# Patient Record
Sex: Female | Born: 1940 | Race: White | Hispanic: No | Marital: Single | Smoking: Never smoker
Health system: Southern US, Community
[De-identification: ages and names within clinical notes are randomized; demographics above are authoritative.]

## PROBLEM LIST (undated history)

## (undated) DIAGNOSIS — J449 Chronic obstructive pulmonary disease, unspecified: Secondary | ICD-10-CM

## (undated) DIAGNOSIS — I509 Heart failure, unspecified: Secondary | ICD-10-CM

## (undated) DIAGNOSIS — IMO0001 Reserved for inherently not codable concepts without codable children: Secondary | ICD-10-CM

## (undated) DIAGNOSIS — I219 Acute myocardial infarction, unspecified: Secondary | ICD-10-CM

## (undated) DIAGNOSIS — I2510 Atherosclerotic heart disease of native coronary artery without angina pectoris: Secondary | ICD-10-CM

## (undated) DIAGNOSIS — F32A Depression, unspecified: Secondary | ICD-10-CM

## (undated) DIAGNOSIS — K219 Gastro-esophageal reflux disease without esophagitis: Secondary | ICD-10-CM

## (undated) DIAGNOSIS — G4700 Insomnia, unspecified: Secondary | ICD-10-CM

## (undated) DIAGNOSIS — N179 Acute kidney failure, unspecified: Secondary | ICD-10-CM

## (undated) DIAGNOSIS — R0600 Dyspnea, unspecified: Secondary | ICD-10-CM

## (undated) DIAGNOSIS — F329 Major depressive disorder, single episode, unspecified: Secondary | ICD-10-CM

## (undated) HISTORY — PX: HIP FRACTURE SURGERY: SHX118

## (undated) HISTORY — PX: ANKLE SURGERY: SHX546

## (undated) HISTORY — PX: CHOLECYSTECTOMY: SHX55

## (undated) HISTORY — PX: ABDOMINAL HYSTERECTOMY: SHX81

## (undated) HISTORY — PX: HIATAL HERNIA REPAIR: SHX195

---

## 2006-12-10 ENCOUNTER — Ambulatory Visit (HOSPITAL_COMMUNITY): Admission: RE | Admit: 2006-12-10 | Discharge: 2006-12-10 | Payer: Self-pay | Admitting: Pulmonary Disease

## 2008-11-30 ENCOUNTER — Encounter: Admission: RE | Admit: 2008-11-30 | Discharge: 2008-11-30 | Payer: Self-pay | Admitting: Family Medicine

## 2010-08-05 ENCOUNTER — Emergency Department (HOSPITAL_COMMUNITY)
Admission: EM | Admit: 2010-08-05 | Discharge: 2010-08-05 | Payer: Self-pay | Source: Home / Self Care | Admitting: Emergency Medicine

## 2010-09-02 ENCOUNTER — Encounter: Payer: Self-pay | Admitting: Pulmonary Disease

## 2010-10-22 LAB — POCT CARDIAC MARKERS
CKMB, poc: <1 ng/mL — ABNORMAL LOW (ref 1.0–8.0)
Myoglobin, poc: 42 ng/mL (ref 12–200)
Troponin i, poc: <0.05 ng/mL (ref 0.00–0.09)

## 2010-10-22 LAB — CBC
HCT: 41 % (ref 36.0–46.0)
Hemoglobin: 12.7 g/dL (ref 12.0–15.0)
MCHC: 31 g/dL (ref 30.0–36.0)
RBC: 4.17 MIL/uL (ref 3.87–5.11)

## 2010-10-22 LAB — DIFFERENTIAL
Eosinophils Absolute: 0 10*3/uL (ref 0.0–0.7)
Eosinophils Relative: 0 % (ref 0–5)
Lymphocytes Relative: 10 % — ABNORMAL LOW (ref 12–46)
Monocytes Absolute: 0.6 10*3/uL (ref 0.1–1.0)
Neutro Abs: 6.1 10*3/uL (ref 1.7–7.7)

## 2010-10-22 LAB — BRAIN NATRIURETIC PEPTIDE: Pro B Natriuretic peptide (BNP): 94 pg/mL (ref 0.0–100.0)

## 2010-10-22 LAB — COMPREHENSIVE METABOLIC PANEL
BUN: 10 mg/dL (ref 6–23)
Calcium: 9.4 mg/dL (ref 8.4–10.5)
Chloride: 98 mEq/L (ref 96–112)
GFR calc non Af Amer: >60 mL/min (ref >60)
Glucose, Bld: 153 mg/dL — ABNORMAL HIGH (ref 70–99)
Sodium: 137 mEq/L (ref 135–145)

## 2011-05-06 ENCOUNTER — Other Ambulatory Visit: Payer: Self-pay | Admitting: Family Medicine

## 2011-05-16 ENCOUNTER — Inpatient Hospital Stay: Admission: RE | Admit: 2011-05-16 | Payer: Self-pay | Source: Ambulatory Visit

## 2011-05-17 ENCOUNTER — Ambulatory Visit
Admission: RE | Admit: 2011-05-17 | Discharge: 2011-05-17 | Disposition: A | Discharge status: Home / Self Care | Payer: Medicare Other | Source: Ambulatory Visit | Attending: Family Medicine | Admitting: Family Medicine

## 2012-03-24 ENCOUNTER — Encounter (HOSPITAL_COMMUNITY): Payer: Self-pay | Admitting: *Deleted

## 2012-03-24 ENCOUNTER — Emergency Department (HOSPITAL_COMMUNITY): Payer: Medicare Other

## 2012-03-24 ENCOUNTER — Inpatient Hospital Stay (HOSPITAL_COMMUNITY)
Admission: EM | Admit: 2012-03-24 | Discharge: 2012-04-08 | DRG: 870 | Disposition: A | Discharge status: Skilled Nursing Facility | Payer: Medicare Other | Attending: Internal Medicine | Admitting: Internal Medicine

## 2012-03-24 DIAGNOSIS — Z8249 Family history of ischemic heart disease and other diseases of the circulatory system: Secondary | ICD-10-CM

## 2012-03-24 DIAGNOSIS — F341 Dysthymic disorder: Secondary | ICD-10-CM | POA: Diagnosis present

## 2012-03-24 DIAGNOSIS — K8309 Other cholangitis: Secondary | ICD-10-CM | POA: Diagnosis present

## 2012-03-24 DIAGNOSIS — K838 Other specified diseases of biliary tract: Secondary | ICD-10-CM | POA: Diagnosis present

## 2012-03-24 DIAGNOSIS — I509 Heart failure, unspecified: Secondary | ICD-10-CM | POA: Diagnosis present

## 2012-03-24 DIAGNOSIS — J449 Chronic obstructive pulmonary disease, unspecified: Secondary | ICD-10-CM

## 2012-03-24 DIAGNOSIS — F329 Major depressive disorder, single episode, unspecified: Secondary | ICD-10-CM | POA: Diagnosis present

## 2012-03-24 DIAGNOSIS — I252 Old myocardial infarction: Secondary | ICD-10-CM

## 2012-03-24 DIAGNOSIS — I959 Hypotension, unspecified: Secondary | ICD-10-CM | POA: Diagnosis not present

## 2012-03-24 DIAGNOSIS — N390 Urinary tract infection, site not specified: Secondary | ICD-10-CM | POA: Diagnosis present

## 2012-03-24 DIAGNOSIS — R509 Fever, unspecified: Secondary | ICD-10-CM

## 2012-03-24 DIAGNOSIS — F419 Anxiety disorder, unspecified: Secondary | ICD-10-CM | POA: Diagnosis present

## 2012-03-24 DIAGNOSIS — R45851 Suicidal ideations: Secondary | ICD-10-CM

## 2012-03-24 DIAGNOSIS — R5381 Other malaise: Secondary | ICD-10-CM | POA: Diagnosis present

## 2012-03-24 DIAGNOSIS — Z9981 Dependence on supplemental oxygen: Secondary | ICD-10-CM

## 2012-03-24 DIAGNOSIS — R000 Tachycardia, unspecified: Secondary | ICD-10-CM | POA: Diagnosis present

## 2012-03-24 DIAGNOSIS — A419 Sepsis, unspecified organism: Principal | ICD-10-CM | POA: Diagnosis present

## 2012-03-24 DIAGNOSIS — J9601 Acute respiratory failure with hypoxia: Secondary | ICD-10-CM

## 2012-03-24 DIAGNOSIS — E559 Vitamin D deficiency, unspecified: Secondary | ICD-10-CM | POA: Diagnosis present

## 2012-03-24 DIAGNOSIS — J4489 Other specified chronic obstructive pulmonary disease: Secondary | ICD-10-CM | POA: Diagnosis present

## 2012-03-24 DIAGNOSIS — R079 Chest pain, unspecified: Secondary | ICD-10-CM | POA: Diagnosis present

## 2012-03-24 DIAGNOSIS — E871 Hypo-osmolality and hyponatremia: Secondary | ICD-10-CM | POA: Diagnosis present

## 2012-03-24 DIAGNOSIS — J9620 Acute and chronic respiratory failure, unspecified whether with hypoxia or hypercapnia: Secondary | ICD-10-CM | POA: Diagnosis present

## 2012-03-24 DIAGNOSIS — I2510 Atherosclerotic heart disease of native coronary artery without angina pectoris: Secondary | ICD-10-CM | POA: Diagnosis present

## 2012-03-24 DIAGNOSIS — D696 Thrombocytopenia, unspecified: Secondary | ICD-10-CM | POA: Diagnosis present

## 2012-03-24 DIAGNOSIS — R6521 Severe sepsis with septic shock: Secondary | ICD-10-CM | POA: Diagnosis present

## 2012-03-24 HISTORY — DX: Acute myocardial infarction, unspecified: I21.9

## 2012-03-24 HISTORY — DX: Atherosclerotic heart disease of native coronary artery without angina pectoris: I25.10

## 2012-03-24 HISTORY — DX: Heart failure, unspecified: I50.9

## 2012-03-24 LAB — POCT I-STAT, CHEM 8
BUN: 17 mg/dL (ref 6–23)
Chloride: 99 mEq/L (ref 96–112)
Potassium: 4.7 mEq/L (ref 3.5–5.1)
Sodium: 138 mEq/L (ref 135–145)
TCO2: 30 mmol/L (ref 0–100)

## 2012-03-24 LAB — CBC WITH DIFFERENTIAL/PLATELET
Basophils Absolute: 0 10*3/uL (ref 0.0–0.1)
Basophils Relative: 0 % (ref 0–1)
Eosinophils Absolute: 0.1 10*3/uL (ref 0.0–0.7)
Eosinophils Relative: 1 % (ref 0–5)
HCT: 38.9 % (ref 36.0–46.0)
Lymphs Abs: 0.5 10*3/uL — ABNORMAL LOW (ref 0.7–4.0)
MCV: 98.2 fL (ref 78.0–100.0)
Monocytes Relative: 10 % (ref 3–12)
Platelets: 185 10*3/uL (ref 150–400)
RBC: 3.96 MIL/uL (ref 3.87–5.11)
WBC: 6.1 10*3/uL (ref 4.0–10.5)

## 2012-03-24 MED ORDER — ASPIRIN 81 MG PO CHEW
324.0000 mg | CHEWABLE_TABLET | Freq: Once | ORAL | Status: AC
  Administered 2012-03-24: 324 mg via ORAL
  Filled 2012-03-24: qty 4

## 2012-03-24 MED ORDER — ONDANSETRON HCL 4 MG/2ML IJ SOLN
4.0000 mg | Freq: Once | INTRAMUSCULAR | Status: AC
  Administered 2012-03-24: 4 mg via INTRAVENOUS
  Filled 2012-03-24: qty 2

## 2012-03-24 MED ORDER — MORPHINE SULFATE 4 MG/ML IJ SOLN: 4.0000 mg | Freq: Once | INTRAMUSCULAR | Status: DC

## 2012-03-24 MED ORDER — PANTOPRAZOLE SODIUM 40 MG IV SOLR
40.0000 mg | Freq: Once | INTRAVENOUS | Status: AC
  Administered 2012-03-24: 40 mg via INTRAVENOUS
  Filled 2012-03-24: qty 40

## 2012-03-24 MED ORDER — MORPHINE SULFATE 4 MG/ML IJ SOLN
4.0000 mg | Freq: Once | INTRAMUSCULAR | Status: AC
Start: 2012-03-24 — End: 2012-03-24
  Administered 2012-03-24: 4 mg via INTRAVENOUS
  Filled 2012-03-24: qty 1

## 2012-03-24 MED ORDER — LORAZEPAM 2 MG/ML IJ SOLN
1.0000 mg | Freq: Once | INTRAMUSCULAR | Status: AC
  Administered 2012-03-24: 1 mg via INTRAVENOUS
  Filled 2012-03-24: qty 1

## 2012-03-24 NOTE — ED Notes (Signed)
Patient is resting comfortably.  Tolerated medications well.  Family at bedside.  Call light within reach.  Alarms on monitor.

## 2012-03-24 NOTE — ED Provider Notes (Signed)
History     CSN: [id number]  Arrival date & time 03/24/12  1811   First MD Initiated Contact with Patient 03/24/12 2133      Chief Complaint  Patient presents with  . Chest Pain    (Consider location/radiation/quality/duration/timing/severity/associated sxs/prior treatment) HPI Comments: Larina Lieurance is a 71 y.o. Female who has had persistent chest pain since yesterday. She takes nitroglycerin frequently for chest pain. In the last 5 days s is used it each day. Her pain is 10 out of 10, at the worst. It remains 10 out of 10 when seen in emergency department. She has no associated shortness of breath with this or dizziness. Her primary care doctor is giving her nitroglycerin. She states that she has never seen a cardiologist. She denies ever having a provocative stress test, or catheterization. She denies cough, nausea, vomiting, change in bowels or urinary habits.  The history is provided by the patient.    Past Medical History  Diagnosis Date  . Coronary artery disease   . Myocardial infarct   . CHF (congestive heart failure)     Past Surgical History  Procedure Date  . Cholecystectomy   . Abdominal hysterectomy   . Ankle surgery     No family history on file.  History  Substance Use Topics  . Smoking status: Never Smoker   . Smokeless tobacco: Not on file  . Alcohol Use: No    OB History    Grav Para Term Preterm Abortions TAB SAB Ect Mult Living                  Review of Systems  All other systems reviewed and are negative.    Allergies  Ambien and Other  Home Medications   No current outpatient prescriptions on file.  BP 119/69  Pulse 77  Temp 98.4 F (36.9 C) (Oral)  Resp 18  Ht 5\' 1"  (1.549 m)  Wt 102 lb 11.8 oz (46.6 kg)  BMI 19.41 kg/m2  SpO2 100%  Physical Exam  Nursing note and vitals reviewed. Constitutional: She is oriented to person, place, and time. She appears well-developed and well-nourished. She appears distressed (She is  uncomfortable).  HENT:  Head: Normocephalic and atraumatic.  Eyes: Conjunctivae and EOM are normal. Pupils are equal, round, and reactive to light.  Neck: Normal range of motion and phonation normal. Neck supple.  Cardiovascular: Normal rate, regular rhythm and intact distal pulses.   Pulmonary/Chest: Effort normal and breath sounds normal. No respiratory distress. She has no wheezes. She exhibits no tenderness.  Abdominal: Soft. She exhibits no distension. There is no tenderness. There is no guarding.  Musculoskeletal: Normal range of motion. She exhibits no edema and no tenderness.  Neurological: She is alert and oriented to person, place, and time. She has normal strength. She exhibits normal muscle tone.  Skin: Skin is warm and dry.  Psychiatric: Her behavior is normal. Judgment and thought content normal.       She is anxious    ED Course  Procedures (including critical care time) Emergency department treatment: Morphine, Zofran, Ativan, protonix, aspirin  Reevaluation: 23:30- chest pain, has improved to 4/10. Patient sedated, can be aroused to speak and answer questions.    Date: 03/24/2012  Rate: 83  Rhythm: normal sinus rhythm  QRS Axis: normal  Intervals: normal  ST/T Wave abnormalities: normal  Conduction Disutrbances:none  Narrative Interpretation: LAH, poor R wave Progression  Old EKG Reviewed: unchanged    Labs Reviewed  CBC  WITH DIFFERENTIAL - Abnormal; Notable for the following:    Neutrophils Relative 81 (*)     Lymphocytes Relative 8 (*)     Lymphs Abs 0.5 (*)     All other components within normal limits  POCT I-STAT, CHEM 8 - Abnormal; Notable for the following:    Glucose, Bld 168 (*)     All other components within normal limits  COMPREHENSIVE METABOLIC PANEL - Abnormal; Notable for the following:    Glucose, Bld 150 (*)     Creatinine, Ser 0.42 (*)  DELTA CHECK NOTED   AST 360 (*)     ALT 425 (*)     Alkaline Phosphatase 239 (*)     Total  Bilirubin 3.6 (*)     All other components within normal limits  CBC - Abnormal; Notable for the following:    WBC 11.6 (*)     All other components within normal limits  GLUCOSE, CAPILLARY - Abnormal; Notable for the following:    Glucose-Capillary 162 (*)     All other components within normal limits  HEMOGLOBIN A1C - Abnormal; Notable for the following:    Hemoglobin A1C 6.0 (*)     Mean Plasma Glucose 126 (*)     All other components within normal limits  GLUCOSE, CAPILLARY - Abnormal; Notable for the following:    Glucose-Capillary 146 (*)     All other components within normal limits  GLUCOSE, CAPILLARY - Abnormal; Notable for the following:    Glucose-Capillary 137 (*)     All other components within normal limits  GLUCOSE, CAPILLARY - Abnormal; Notable for the following:    Glucose-Capillary 191 (*)     All other components within normal limits  URINALYSIS, ROUTINE W REFLEX MICROSCOPIC - Abnormal; Notable for the following:    Color, Urine AMBER (*)  BIOCHEMICALS MAY BE AFFECTED BY COLOR   Glucose, UA 100 (*)     Hgb urine dipstick MODERATE (*)     Bilirubin Urine LARGE (*)     Ketones, ur 15 (*)     Protein, ur 30 (*)     Nitrite POSITIVE (*)     Leukocytes, UA SMALL (*)     All other components within normal limits  URINE MICROSCOPIC-ADD ON - Abnormal; Notable for the following:    Squamous Epithelial / LPF FEW (*)     Casts GRANULAR CAST (*)     All other components within normal limits  GLUCOSE, CAPILLARY - Abnormal; Notable for the following:    Glucose-Capillary 151 (*)     All other components within normal limits  BASIC METABOLIC PANEL - Abnormal; Notable for the following:    Potassium 3.1 (*)     Glucose, Bld 142 (*)     Calcium 8.1 (*)     All other components within normal limits  CBC - Abnormal; Notable for the following:    WBC 22.4 (*)     RBC 3.59 (*)     Hemoglobin 11.2 (*)     HCT 35.0 (*)     Platelets 127 (*)     All other components within  normal limits  LACTIC ACID, PLASMA - Abnormal; Notable for the following:    Lactic Acid, Venous 3.5 (*)     All other components within normal limits  GLUCOSE, CAPILLARY - Abnormal; Notable for the following:    Glucose-Capillary 100 (*)     All other components within normal limits  CBC WITH DIFFERENTIAL -  Abnormal; Notable for the following:    WBC 15.0 (*)     RBC 3.24 (*)     Hemoglobin 10.3 (*)     HCT 31.8 (*)     Platelets 109 (*)  PLATELET COUNT CONFIRMED BY SMEAR   Neutrophils Relative 95 (*)     Lymphocytes Relative 2 (*)     Neutro Abs 14.2 (*)     Lymphs Abs 0.3 (*)     All other components within normal limits  BLOOD GAS, ARTERIAL - Abnormal; Notable for the following:    pO2, Arterial 65.0 (*)     Acid-base deficit 2.9 (*)     All other components within normal limits  COMPREHENSIVE METABOLIC PANEL - Abnormal; Notable for the following:    Potassium 3.4 (*)     Glucose, Bld 109 (*)     Calcium 7.6 (*)     Total Protein 4.8 (*)     Albumin 2.5 (*)     AST 117 (*)     ALT 216 (*)     Alkaline Phosphatase 143 (*)     Total Bilirubin 3.4 (*)     All other components within normal limits  LACTIC ACID, PLASMA - Abnormal; Notable for the following:    Lactic Acid, Venous 2.5 (*)     All other components within normal limits  CARBOXYHEMOGLOBIN - Abnormal; Notable for the following:    Total hemoglobin 11.3 (*)     Methemoglobin 1.9 (*)     All other components within normal limits  GLUCOSE, CAPILLARY - Abnormal; Notable for the following:    Glucose-Capillary 130 (*)     All other components within normal limits  POCT I-STAT 3, BLOOD GAS (G3+) - Abnormal; Notable for the following:    pO2, Arterial 68.0 (*)     Acid-base deficit 5.0 (*)     All other components within normal limits  FIBRINOGEN - Abnormal; Notable for the following:    Fibrinogen 571 (*)     All other components within normal limits  PROTIME-INR - Abnormal; Notable for the following:     Prothrombin Time 18.1 (*)     All other components within normal limits  GLUCOSE, CAPILLARY - Abnormal; Notable for the following:    Glucose-Capillary 117 (*)     All other components within normal limits  GLUCOSE, CAPILLARY - Abnormal; Notable for the following:    Glucose-Capillary 132 (*)     All other components within normal limits  CBC - Abnormal; Notable for the following:    RBC 3.37 (*)     Hemoglobin 10.3 (*)     HCT 32.6 (*)     Platelets 64 (*)     All other components within normal limits  BASIC METABOLIC PANEL - Abnormal; Notable for the following:    Sodium 130 (*)  DELTA CHECK NOTED   Glucose, Bld 174 (*)     BUN 24 (*)     Calcium 7.4 (*)     GFR calc non Af Amer 86 (*)     All other components within normal limits  CBC - Abnormal; Notable for the following:    WBC 26.3 (*)     RBC 3.33 (*)     Hemoglobin 10.3 (*)     HCT 31.9 (*)     Platelets 44 (*)     All other components within normal limits  HEPATIC FUNCTION PANEL - Abnormal; Notable for the following:  Total Protein 4.6 (*)     Albumin 2.0 (*)     AST 76 (*)     ALT 139 (*)     Alkaline Phosphatase 156 (*)     Total Bilirubin 4.5 (*)     Bilirubin, Direct 3.8 (*)     All other components within normal limits  LACTIC ACID, PLASMA - Abnormal; Notable for the following:    Lactic Acid, Venous 2.5 (*)     All other components within normal limits  PROTIME-INR - Abnormal; Notable for the following:    Prothrombin Time 15.7 (*)     All other components within normal limits  GLUCOSE, CAPILLARY - Abnormal; Notable for the following:    Glucose-Capillary 122 (*)     All other components within normal limits  GLUCOSE, CAPILLARY - Abnormal; Notable for the following:    Glucose-Capillary 128 (*)     All other components within normal limits  GLUCOSE, CAPILLARY - Abnormal; Notable for the following:    Glucose-Capillary 141 (*)     All other components within normal limits  GLUCOSE, CAPILLARY -  Abnormal; Notable for the following:    Glucose-Capillary 147 (*)     All other components within normal limits  POCT I-STAT TROPONIN I  TROPONIN I  MAGNESIUM  CARDIAC PANEL(CRET KIN+CKTOT+MB+TROPI)  CARDIAC PANEL(CRET KIN+CKTOT+MB+TROPI)  CARDIAC PANEL(CRET KIN+CKTOT+MB+TROPI)  TSH  CULTURE, BLOOD (ROUTINE X 2)  CULTURE, BLOOD (ROUTINE X 2)  URINE CULTURE  MRSA PCR SCREENING  GLUCOSE, CAPILLARY  PROCALCITONIN  CARDIAC PANEL(CRET KIN+CKTOT+MB+TROPI)  AMYLASE  LIPASE, BLOOD  CORTISOL  PROCALCITONIN  TYPE AND SCREEN  CARDIAC PANEL(CRET KIN+CKTOT+MB+TROPI)  ABO/RH  APTT  GLUCOSE, CAPILLARY  PROCALCITONIN  TSH   Ct Abdomen Pelvis W Contrast  03/26/2012  *RADIOLOGY REPORT*  Clinical Data: Abdominal pain, septic shock, nausea/vomiting  CT ABDOMEN AND PELVIS WITH CONTRAST  Technique:  Multidetector CT imaging of the abdomen and pelvis was performed following the standard protocol during bolus administration of intravenous contrast.  Contrast: 80mL OMNIPAQUE IOHEXOL 300 MG/ML  SOLN  Comparison: None.  Findings: Very large hiatal hernia, incompletely visualized, containing stomach to the right of midline (series 2/image 7), multiple loops of opacified and nonopacified bowel, and pancreas centrally (series 2/image 6).  Liver is notable for moderate to severe central intrahepatic ductal dilatation.  Common bile duct is markedly dilated with a 1.5 x 1.0 cm abnormal soft tissue lesion centrally (series 2/image 22) and a 2.7 x 1.9 cm obstructing mass distally near the ampulla in the left abdomen (series 2/image 15).  Status post cholecystectomy.  Spleen and adrenal glands are unremarkable.  5 mm nonobstructing left upper pole calculus (series 2/image 11). Right kidney is unremarkable.  No hydronephrosis.  Enteric tube terminates in the proximal duodenum in the left abdomen (series 2/image 23).  Contrast passes into the colon, indicating that the large hernia is nonobstructive.  Colonic  diverticulosis, without associated inflammatory changes.  Atherosclerotic calcifications of the abdominal aorta and branch vessels.  Small volume abdominopelvic ascites.  Status post hysterectomy.  No adnexal masses.  Bladder is notable for nondependent gas and an indwelling Foley catheter.  Prior ORIF of the proximal left femur.  Degenerative changes of the visualized thoracolumbar spine.  Mild superior endplate compression deformities at L2 and L5.  IMPRESSION:  Distorted anatomy with very large hiatal hernia containing stomach, multiple loops of bowel, and pancreas, incompletely visualized.  No evidence of bowel obstruction.  Marked dilatation of the common bile duct with at  least two intraductal lesions, as described above, including a 2.7 x 1.9 cm obstructing mass near the ampulla. Moderate to severe intrahepatic ductal dilatation.  Additional ancillary findings as above.  Original Report Authenticated By: Charline Bills, M.D.   Dg Chest Port 1 View  03/26/2012  *RADIOLOGY REPORT*  Clinical Data: Endotracheal tube placement.  Central venous catheter placement.  PORTABLE CHEST - 1 VIEW  Comparison: 03/26/2012.  Findings: Endotracheal tube is 15 mm from the carina.  New right IJ central line is present with its tip one vertebral body below the carina, in the mid-to-lower SVC.  Large bilateral pleural effusions.  Cardiopericardial silhouette mostly obscured by effusions.  Basilar atelectasis.  Pulmonary vascular congestion. Bobby pin projects over the right upper chest.  IMPRESSION:  1.  New endotracheal tube is 15 mm from the carina. 2.  Uncomplicated right IJ central line placement with the tip in the mid SVC. 3.  Bilateral pleural effusions, left greater than right with associated compressive atelectasis.  Probable cardiomegaly with portions of the cardiopericardial silhouette obscured  Original Report Authenticated By: Andreas Newport, M.D.   Dg Chest Port 1 View  03/26/2012  *RADIOLOGY REPORT*   Clinical Data: Chest pressure.  PORTABLE CHEST - 1 VIEW  Comparison: 03/24/2012.  Findings: The cardiopericardial size silhouette is obscured. Surgical clip is present in the lower chest. Bilateral left greater than right pleural effusions are present, increased from prior exam.  Additionally, there is pulmonary vascular congestion and bilateral lower lobe airspace disease, most compatible with pulmonary edema in the interval since the prior exam.  IMPRESSION: Findings compatible with moderate CHF superimposed on chronic changes.  Bilateral left greater than right pleural effusions.  Original Report Authenticated By: Andreas Newport, M.D.     1. Chest pain   2. Anxiety   3. CAD (coronary artery disease)   4. Chest pain syndrome   5. CHF (congestive heart failure)   6. COPD (chronic obstructive pulmonary disease)   7. Vitamin D deficiency disease   8. Suicidal ideation   9. Septic shock   10. UTI (lower urinary tract infection)   11. Acute respiratory failure with hypoxia       MDM  Nonspecific chest pain, with low cardiac risk. The chest pain is atypical for coronary artery disease. She's never had formal testing. Patient is decompensated. She will need further evaluation In hospital setting.      Plan: Admit- Hospitalist  Flint Melter, MD 03/27/12 [phone]

## 2012-03-24 NOTE — ED Notes (Signed)
Pt is on 3L O2 [place] at home.

## 2012-03-24 NOTE — ED Notes (Signed)
Pt is here with chest pain that started yesterday afternoon and she took nitro and has continued to reoccur.  Pt is having mid chest pain with radiation into left jaw

## 2012-03-25 DIAGNOSIS — F419 Anxiety disorder, unspecified: Secondary | ICD-10-CM | POA: Diagnosis present

## 2012-03-25 DIAGNOSIS — I509 Heart failure, unspecified: Secondary | ICD-10-CM | POA: Diagnosis present

## 2012-03-25 DIAGNOSIS — R079 Chest pain, unspecified: Secondary | ICD-10-CM | POA: Diagnosis present

## 2012-03-25 DIAGNOSIS — R509 Fever, unspecified: Secondary | ICD-10-CM

## 2012-03-25 DIAGNOSIS — E559 Vitamin D deficiency, unspecified: Secondary | ICD-10-CM | POA: Diagnosis present

## 2012-03-25 DIAGNOSIS — I2510 Atherosclerotic heart disease of native coronary artery without angina pectoris: Secondary | ICD-10-CM | POA: Diagnosis present

## 2012-03-25 DIAGNOSIS — R45851 Suicidal ideations: Secondary | ICD-10-CM

## 2012-03-25 DIAGNOSIS — F411 Generalized anxiety disorder: Secondary | ICD-10-CM

## 2012-03-25 DIAGNOSIS — N390 Urinary tract infection, site not specified: Secondary | ICD-10-CM

## 2012-03-25 DIAGNOSIS — F332 Major depressive disorder, recurrent severe without psychotic features: Secondary | ICD-10-CM

## 2012-03-25 DIAGNOSIS — J449 Chronic obstructive pulmonary disease, unspecified: Secondary | ICD-10-CM | POA: Diagnosis present

## 2012-03-25 DIAGNOSIS — I959 Hypotension, unspecified: Secondary | ICD-10-CM | POA: Diagnosis not present

## 2012-03-25 LAB — TSH: TSH: 1.573 u[IU]/mL (ref 0.350–4.500)

## 2012-03-25 LAB — CARDIAC PANEL(CRET KIN+CKTOT+MB+TROPI)
CK, MB: 1.5 ng/mL (ref 0.3–4.0)
CK, MB: 2.2 ng/mL (ref 0.3–4.0)
Relative Index: INVALID (ref 0.0–2.5)
Total CK: 100 U/L (ref 7–177)
Troponin I: <0.3 ng/mL (ref <0.30)
Troponin I: <0.3 ng/mL (ref <0.30)

## 2012-03-25 LAB — COMPREHENSIVE METABOLIC PANEL
AST: 360 U/L — ABNORMAL HIGH (ref 0–37)
Albumin: 3.5 g/dL (ref 3.5–5.2)
Alkaline Phosphatase: 239 U/L — ABNORMAL HIGH (ref 39–117)
CO2: 29 mEq/L (ref 19–32)
Chloride: 99 mEq/L (ref 96–112)
Creatinine, Ser: 0.42 mg/dL — ABNORMAL LOW (ref 0.50–1.10)
GFR calc non Af Amer: >90 mL/min (ref >90)
Potassium: 3.9 mEq/L (ref 3.5–5.1)
Total Bilirubin: 3.6 mg/dL — ABNORMAL HIGH (ref 0.3–1.2)

## 2012-03-25 LAB — URINALYSIS, ROUTINE W REFLEX MICROSCOPIC
Ketones, ur: 15 mg/dL — AB
Specific Gravity, Urine: 1.027 (ref 1.005–1.030)
pH: 5.5 (ref 5.0–8.0)

## 2012-03-25 LAB — GLUCOSE, CAPILLARY
Glucose-Capillary: 191 mg/dL — ABNORMAL HIGH (ref 70–99)
Glucose-Capillary: 151 mg/dL — ABNORMAL HIGH (ref 70–99)
Glucose-Capillary: 100 mg/dL — ABNORMAL HIGH (ref 70–99)

## 2012-03-25 LAB — CBC
HCT: 39.2 % (ref 36.0–46.0)
MCH: 31.5 pg (ref 26.0–34.0)
MCHC: 32.1 g/dL (ref 30.0–36.0)
MCV: 98 fL (ref 78.0–100.0)
Platelets: 179 10*3/uL (ref 150–400)
RDW: 13.3 % (ref 11.5–15.5)
WBC: 11.6 10*3/uL — ABNORMAL HIGH (ref 4.0–10.5)

## 2012-03-25 LAB — URINE MICROSCOPIC-ADD ON

## 2012-03-25 LAB — MRSA PCR SCREENING: MRSA by PCR: NEGATIVE

## 2012-03-25 LAB — LACTIC ACID, PLASMA: Lactic Acid, Venous: 3.5 mmol/L — ABNORMAL HIGH (ref 0.5–2.2)

## 2012-03-25 LAB — PROCALCITONIN: Procalcitonin: 9.25 ng/mL

## 2012-03-25 MED ORDER — NITROGLYCERIN 0.4 MG SL SUBL: 0.4000 mg | SUBLINGUAL_TABLET | SUBLINGUAL | Status: DC | PRN

## 2012-03-25 MED ORDER — NITROGLYCERIN 0.1 MG/HR TD PT24
0.1000 mg | MEDICATED_PATCH | Freq: Every day | TRANSDERMAL | Status: DC
  Administered 2012-03-25: 0.1 mg via TRANSDERMAL
  Filled 2012-03-25 (×4): qty 1

## 2012-03-25 MED ORDER — INSULIN ASPART 100 UNIT/ML ~~LOC~~ SOLN
0.0000 [IU] | Freq: Three times a day (TID) | SUBCUTANEOUS | Status: DC
  Administered 2012-03-25: 2 [IU] via SUBCUTANEOUS
  Administered 2012-03-25 – 2012-03-28 (×3): 1 [IU] via SUBCUTANEOUS

## 2012-03-25 MED ORDER — BIOTENE DRY MOUTH MT LIQD
15.0000 mL | Freq: Two times a day (BID) | OROMUCOSAL | Status: DC
  Administered 2012-03-25 – 2012-04-03 (×17): 15 mL via OROMUCOSAL

## 2012-03-25 MED ORDER — SODIUM CHLORIDE 0.9 % IJ SOLN
3.0000 mL | Freq: Two times a day (BID) | INTRAMUSCULAR | Status: DC
  Administered 2012-03-25 – 2012-03-30 (×10): 3 mL via INTRAVENOUS

## 2012-03-25 MED ORDER — SODIUM CHLORIDE 0.9 % IV SOLN: 250.0000 mL | INTRAVENOUS | Status: DC | PRN

## 2012-03-25 MED ORDER — ACETAMINOPHEN 650 MG RE SUPP: 650.0000 mg | Freq: Four times a day (QID) | RECTAL | Status: DC | PRN

## 2012-03-25 MED ORDER — ONDANSETRON HCL 4 MG/2ML IJ SOLN
4.0000 mg | Freq: Four times a day (QID) | INTRAMUSCULAR | Status: DC | PRN
  Administered 2012-03-25 – 2012-04-01 (×3): 4 mg via INTRAVENOUS
  Filled 2012-03-25 (×4): qty 2

## 2012-03-25 MED ORDER — SODIUM CHLORIDE 0.9 % IV BOLUS (SEPSIS)
500.0000 mL | Freq: Once | INTRAVENOUS | Status: AC
  Administered 2012-03-25: 500 mL via INTRAVENOUS

## 2012-03-25 MED ORDER — ONDANSETRON HCL 4 MG/2ML IJ SOLN: 4.0000 mg | Freq: Three times a day (TID) | INTRAMUSCULAR | Status: DC | PRN

## 2012-03-25 MED ORDER — ACETAMINOPHEN 325 MG PO TABS
650.0000 mg | ORAL_TABLET | Freq: Four times a day (QID) | ORAL | Status: DC | PRN
  Administered 2012-03-25 – 2012-03-29 (×3): 650 mg via ORAL
  Filled 2012-03-25 (×2): qty 2

## 2012-03-25 MED ORDER — SODIUM CHLORIDE 0.9 % IV SOLN
750.0000 mg | INTRAVENOUS | Status: DC
  Administered 2012-03-25 – 2012-03-27 (×2): 750 mg via INTRAVENOUS
  Filled 2012-03-25 (×3): qty 750

## 2012-03-25 MED ORDER — CARVEDILOL 12.5 MG PO TABS
12.5000 mg | ORAL_TABLET | Freq: Two times a day (BID) | ORAL | Status: DC
  Administered 2012-03-25: 12.5 mg via ORAL
  Filled 2012-03-25 (×3): qty 1

## 2012-03-25 MED ORDER — HYDROCODONE-ACETAMINOPHEN 5-325 MG PO TABS
1.0000 | ORAL_TABLET | ORAL | Status: DC | PRN
  Administered 2012-03-25: 2 via ORAL
  Administered 2012-03-25: 1 via ORAL
  Filled 2012-03-25: qty 2
  Filled 2012-03-25: qty 1

## 2012-03-25 MED ORDER — METOPROLOL TARTRATE 1 MG/ML IV SOLN
5.0000 mg | Freq: Once | INTRAVENOUS | Status: AC
  Administered 2012-03-25: 5 mg via INTRAVENOUS

## 2012-03-25 MED ORDER — ASPIRIN EC 81 MG PO TBEC
81.0000 mg | DELAYED_RELEASE_TABLET | Freq: Every day | ORAL | Status: DC
  Administered 2012-03-25 – 2012-04-08 (×12): 81 mg via ORAL
  Filled 2012-03-25 (×15): qty 1

## 2012-03-25 MED ORDER — MORPHINE SULFATE 2 MG/ML IJ SOLN: 1.0000 mg | INTRAMUSCULAR | Status: DC | PRN

## 2012-03-25 MED ORDER — SODIUM CHLORIDE 0.9 % IV BOLUS (SEPSIS)
500.0000 mL | Freq: Once | INTRAVENOUS | Status: AC
  Administered 2012-03-26: 500 mL via INTRAVENOUS

## 2012-03-25 MED ORDER — HYDROCODONE-ACETAMINOPHEN 7.5-325 MG PO TABS
1.0000 | ORAL_TABLET | Freq: Three times a day (TID) | ORAL | Status: DC
  Administered 2012-03-25 (×2): 1 via ORAL
  Filled 2012-03-25 (×2): qty 1

## 2012-03-25 MED ORDER — ONDANSETRON HCL 4 MG/5ML PO SOLN
4.0000 mg | Freq: Four times a day (QID) | ORAL | Status: DC | PRN
  Filled 2012-03-25: qty 5

## 2012-03-25 MED ORDER — SODIUM CHLORIDE 0.9 % IV BOLUS (SEPSIS)
1000.0000 mL | Freq: Once | INTRAVENOUS | Status: AC
  Administered 2012-03-25: 1000 mL via INTRAVENOUS

## 2012-03-25 MED ORDER — SODIUM CHLORIDE 0.9 % IV BOLUS (SEPSIS): 1000.0000 mL | Freq: Once | INTRAVENOUS | Status: DC

## 2012-03-25 MED ORDER — ENOXAPARIN SODIUM 30 MG/0.3ML ~~LOC~~ SOLN
30.0000 mg | Freq: Every day | SUBCUTANEOUS | Status: DC
  Administered 2012-03-25: 30 mg via SUBCUTANEOUS
  Filled 2012-03-25 (×2): qty 0.3

## 2012-03-25 MED ORDER — DEXTROSE 5 % IV SOLN
1.0000 g | INTRAVENOUS | Status: DC
  Administered 2012-03-25: 1 g via INTRAVENOUS
  Filled 2012-03-25: qty 10

## 2012-03-25 MED ORDER — DOCUSATE SODIUM 100 MG PO CAPS
100.0000 mg | ORAL_CAPSULE | Freq: Two times a day (BID) | ORAL | Status: DC
  Administered 2012-03-25 (×2): 100 mg via ORAL
  Filled 2012-03-25 (×3): qty 1

## 2012-03-25 MED ORDER — PANTOPRAZOLE SODIUM 40 MG PO TBEC
40.0000 mg | DELAYED_RELEASE_TABLET | Freq: Two times a day (BID) | ORAL | Status: DC
  Administered 2012-03-25: 40 mg via ORAL
  Filled 2012-03-25: qty 1

## 2012-03-25 MED ORDER — DEXTROSE 5 % IV SOLN
1.0000 g | INTRAVENOUS | Status: DC
  Administered 2012-03-25: 1 g via INTRAVENOUS
  Filled 2012-03-25: qty 1

## 2012-03-25 MED ORDER — SODIUM CHLORIDE 0.9 % IJ SOLN: 3.0000 mL | INTRAMUSCULAR | Status: DC | PRN

## 2012-03-25 MED ORDER — LORAZEPAM 2 MG/ML IJ SOLN
1.0000 mg | Freq: Once | INTRAMUSCULAR | Status: DC
  Filled 2012-03-25: qty 1

## 2012-03-25 MED ORDER — METOPROLOL TARTRATE 1 MG/ML IV SOLN
INTRAVENOUS | Status: AC
  Filled 2012-03-25: qty 5

## 2012-03-25 NOTE — Clinical Documentation Improvement (Signed)
BMI DOCUMENTATION CLARIFICATION QUERY  THIS DOCUMENT IS NOT A PERMANENT PART OF THE MEDICAL RECORD  TO RESPOND TO THE THIS QUERY, FOLLOW THE INSTRUCTIONS BELOW:  1. If needed, update documentation for the patient's encounter via the notes activity.  2. Access this query again and click edit on the In [employer].  3. After updating, or not, click F2 to complete all highlighted (required) fields concerning your review. Select "additional documentation in the medical record" OR "no additional documentation provided".  4. Click Sign note button.  5. The deficiency will fall out of your In Basket *Please let us know if you are not able to complete this workflow by phone or e-mail (listed below).         03/25/12  Dear Dr. Sharyon Medicus  Marton Redwood  In an effort to better capture your patient's severity of illness, reflect appropriate length of stay and utilization of resources, a review of the patient medical record has revealed the following indicators.  THANK YOU FOR ADDRESSING IN NOTES AND DC SUMMARY ABNORMAL VALUE.  Possible Clinical conditions - Underweight= BMI <19.0  - Other Condition (please specify) - Cannot Clinically Determine  Supporting Information: - Patient's BMI = 18.4 per Doc flowsheet 8/14    Reviewed:  no additional documentation provided  Thank You,  Beverley Fiedler RN Clinical Documentation Specialist: Pager:  [phone] Health Information Management:  [phone] [facility]

## 2012-03-25 NOTE — Progress Notes (Addendum)
03/25/2012 2055  Pt admitted into 3307 from 3W with belongings. T 99.2  BP 88/44  HR 129  RR 22  SpO2 95% on 3L [place]. Pt alert and oriented to self. Schorr, NP with [facility], notified. New orders received for 500 cc bolus. Family and suicide sitter are at bedside. Will administer bolus and continue to monitor.  [place] Lewie Deman, RN

## 2012-03-25 NOTE — H&P (Signed)
See Signed H&P done same day

## 2012-03-25 NOTE — Progress Notes (Signed)
PT Cancellation Note    Treatment cancelled today due to pt not appropriate today--rapid response just called recently.  Will see tomorrow as able. 03/25/2012  [place] Bing, PT [phone] (pager)

## 2012-03-25 NOTE — H&P (Signed)
[facility]                                                                                    Patient Demographics  Laurie Sparks, is a 71 y.o. female  CSN: [id number]  MRN: [id number]  DOB - [birth year]  Admit Date - 03/24/2012  Outpatient Primary MD for the patient is Warrick Parisian, MD   With History of -  Past Medical History  Diagnosis Date  . Coronary artery disease   . Myocardial infarct   . CHF (congestive heart failure)       Past Surgical History  Procedure Date  . Cholecystectomy   . Abdominal hysterectomy   . Ankle surgery     in for   Chief Complaint  Patient presents with  . Chest Pain     HPI  Laurie Sparks  is a 72 y.o. female, with a past medical history significant for carotid disease status post MI in the past presenting today with 2 days history of continuous chest pain retrosternal nonradiating associated with mild shortness of breath dry heaves but  no cold sweats the patient noted that her chest pain was getting worse with exercise although it was present at rest as well. Patient denies fevers chills cough. Patient denies any headaches or dizziness. Patient denies any loss of consciousness. The patient reports history of orthopnea and PND but no history of leg swelling. Patient has a history of coronary artery disease , but she doesn't remember any thing about her previous MI.   Review of Systems    In addition to the HPI above, No Fever-chills, No Headache, No changes with Vision or hearing, No problems swallowing food or Liquids, Has chest pain Chest pain, Cough or Shortness of Breath, No Abdominal pain,+Nausea or Vommitting, Bowel movements are regular, No Blood in stool or Urine, No dysuria, No new skin rashes or bruises, No new joints pains-aches,  No new weakness, tingling, numbness in any extremity, No recent weight gain or loss, No polyuria, polydypsia or polyphagia,  A full 10 point Review of Systems was done,  except as stated above, all other Review of Systems were negative.   Social History History  Substance Use Topics  . Smoking status: Never Smoker   . Smokeless tobacco: Not on file  . Alcohol Use: No    Family History Family history is significant for mother with history of coronary artery disease and tuberculosis and was dead now, her father had congestive heart failure and is also deceased.   Prior to Admission medications   Medication Sig Start Date End Date Taking? Authorizing Provider  aspirin EC 81 MG tablet Take 81 mg by mouth daily.   Yes Historical Provider, MD  carvedilol (COREG) 12.5 MG tablet Take 12.5 mg by mouth 2 (two) times daily with a meal.   Yes Historical Provider, MD  HYDROcodone-acetaminophen (NORCO) 7.5-325 MG per tablet Take 1 tablet by mouth 3 (three) times daily.   Yes Historical Provider, MD  nitroGLYCERIN (NITROSTAT) 0.4 MG SL tablet Place 0.4 mg under the tongue every 5 (five) minutes as needed. For chest pain   Yes Historical Provider, MD  Allergies  Allergen Reactions  . Ambien (Zolpidem Tartrate) Other (See Comments)    Hallucinate   . Other     Base metal and something else    Physical Exam  Vitals  Blood pressure 158/75, pulse 105, temperature 98.3 F (36.8 C), temperature source Oral, resp. rate 26, SpO2 99.00%.   1. General chronically ill debilitated patient lying in bed in no acute distress alert awake but slightly confused due to the medications that she received in the emergency room   2. Flat affectt, Not Suicidal or Homicidal, Awake Alert, confused 3. No F.N deficits, ALL C.Nerves Intact, Strength4/5 all 4 extremities, Sensation intact all 4 extremities, Plantars down going.  4. Ears and Eyes appear Normal, Conjunctivae clear, PERRLA. Moist Oral Mucosa.  5. Supple Neck, No JVD, No cervical lymphadenopathy appriciated, No Carotid Bruits.  6. Symmetrical Chest wall movement, decreased air movement bilaterally, tenderness noted  on the left chest area.  7. RRR, No Gallops, Rubs or Murmurs, No Parasternal Heave.  8. Positive Bowel Sounds, Abdomen Soft, Non tender, No organomegaly appriciated,No rebound -guarding or rigidity.  9.  No Cyanosis, Normal Skin Turgor, No Skin Rash or Bruise.  10. Good muscle tone,  joints appear normal , no effusions, Normal ROM.  11. No Palpable Lymph Nodes in Neck or Axillae    Data Review  CBC  Lab 03/24/12 1948 03/24/12 1940  WBC -- 6.1  HGB 13.9 12.5  HCT 41.0 38.9  PLT -- 185  MCV -- 98.2  MCH -- 31.6  MCHC -- 32.1  RDW -- 13.3  LYMPHSABS -- 0.5*  MONOABS -- 0.6  EOSABS -- 0.1  BASOSABS -- 0.0  BANDABS -- --   ------------------------------------------------------------------------------------------------------------------  Chemistries   Lab 03/24/12 1948  NA 138  K 4.7  CL 99  CO2 --  GLUCOSE 168*  BUN 17  CREATININE 0.70  CALCIUM --  MG --  AST --  ALT --  ALKPHOS --  BILITOT --   ------------------------------------------------------------------------------------------------------------------ CrCl is unknown because there is no height on file for the current visit. ------------------------------------------------------------------------------------------------------------------  Coagulation profile -------------------------------------------------------------------------------------------------------------------  Cardiac Enzymes  Lab 03/24/12 2154  CKMB --  TROPONINI <0.30  MYOGLOBIN --   ------------------------------------------------------------------------------------------------------------------  ---------------------------------------------------------------------------------------------------------------   ----------------------------------------------------------------------------------------------------------------  Imaging results:   Dg Chest 2 View  03/24/2012  *RADIOLOGY REPORT*  Clinical Data: Mid and left chest  pain.  CHEST - 2 VIEW  Comparison: PA and lateral chest 07/26/2010.  Findings: Marked elevation of the left hemidiaphragm is again seen. The chest is hyperexpanded.  Blunting of the right costophrenic angle is unchanged.  Heart size is upper normal.  IMPRESSION: No acute finding.  Stable compared to prior exam.  Original Report Authenticated By: Bernadene Bell. Maricela Curet, M.D.    My personal review of EKG: No acute changes probably old anteroseptal MI Assessment & Plan  Principal Problem:  *Chest pain syndrome Active Problems:  CAD (coronary artery disease)  COPD (chronic obstructive pulmonary disease)  Anxiety  CHF (congestive heart failure)  Vitamin D deficiency disease    1. chest pain rule out MI the patient has a history of coronary artery disease status post MI in the past according to her. There is some chest tenderness on the left side, but the patient says that her chest pain is worse with exercise. Patient has a history of atypical chest pain. We will rule out MI. DVT prophylaxis with Lovenox, aspirin, Nitro-Dur, check echocardiogram, consult cardiology in a.m.    2. congestive heart failure  check echocardiogram in a.m. patient has orthopnea .  3. hyper glycemia with no history of diabetes will follow with insulin sliding scale  4. history of COPD. Chronic  5. anxiety/depression. We'll continue Ativan when necessary for now the patient might benefit from antidepressant medications to be ordered as outpatient  6. history of chronic pain. Pain control  7. vitamin D deficiency. Chronic      DVT Prophylaxis-  Lovenox  AM Labs Ordered, also please review Full Orders  Family Communication: Admission, patients condition and plan of care including tests being ordered have been discussed with the patient and daughter who indicate understanding and agree with the plan and Code Status.  Code Status full  Disposition Plan: Home to followup with primary medical doctor  Time spent  in minutes : 30 Condition fair

## 2012-03-25 NOTE — Research (Signed)
Noticed pts HR St at 130s, walked into pts room and informed by psych MD that pt having hard time breathing; pts HR 127, Resp 26, O2 sat 95% 3 L-[place], rectal temp 102.7; pt shaking and looks pale, however pt not c/o chills; EKG obtained; Dr.Rai made aware, IV lopressor given, BP down to 118/52, pt resting comfortably, UA and UC obtained; will continue to monitor

## 2012-03-25 NOTE — Progress Notes (Addendum)
ANTIBIOTIC CONSULT NOTE - INITIAL  Pharmacy Consult for vancomycin and cefepime  Indication: rule out sepsis  Allergies  Allergen Reactions  . Ambien (Zolpidem Tartrate) Other (See Comments)    Hallucinate   . Other     Base metal and something else    Patient Measurements: Height: 5\' 1"  (154.9 cm) Weight: 97 lb (44 kg) IBW/kg (Calculated) : 47.8  Adjusted Body Weight:   Vital Signs: Temp: 99 F (37.2 C) (08/14 2201) Temp src: Oral (08/14 2201) BP: 88/38 mmHg (08/14 2201) Pulse Rate: 124  (08/14 2201) Intake/Output from previous day:   Intake/Output from this shift: Total I/O In: 3 [I.V.:3] Out: -   Labs:  Basename 03/25/12 0521 03/24/12 1948 03/24/12 1940  WBC 11.6* -- 6.1  HGB 12.6 13.9 12.5  PLT 179 -- 185  LABCREA -- -- --  CREATININE 0.42* 0.70 --   Estimated Creatinine Clearance: 44.8 ml/min (by C-G formula based on Cr of 0.42). No results found for this basename: VANCOTROUGH:2,VANCOPEAK:2,VANCORANDOM:2,GENTTROUGH:2,GENTPEAK:2,GENTRANDOM:2,TOBRATROUGH:2,TOBRAPEAK:2,TOBRARND:2,AMIKACINPEAK:2,AMIKACINTROU:2,AMIKACIN:2, in the last 72 hours   Microbiology: No results found for this or any previous visit (from the past 720 hour(s)).  Medical History: Past Medical History  Diagnosis Date  . Coronary artery disease   . Myocardial infarct   . CHF (congestive heart failure)     Medications:  Prescriptions prior to admission  Medication Sig Dispense Refill  . aspirin EC 81 MG tablet Take 81 mg by mouth daily.      . carvedilol (COREG) 12.5 MG tablet Take 12.5 mg by mouth 2 (two) times daily with a meal.      . HYDROcodone-acetaminophen (NORCO) 7.5-325 MG per tablet Take 1 tablet by mouth 3 (three) times daily.      . nitroGLYCERIN (NITROSTAT) 0.4 MG SL tablet Place 0.4 mg under the tongue every 5 (five) minutes as needed. For chest pain       Assessment: Probable uti and early sepsis. vanc and cefepime for empiric coverage. bp 88/44 and hr 129. Pt appears  lethargic but oriented.    Goal of Therapy:  Vancomycin trough level 15-20 mcg/ml  Plan:  Cefepime 1 gm q24hours  Vancomycin 750 mg q24 hours Follow up culture results F/u trough in 4 days Laurie Sparks 03/25/2012,10:35 PM  Cefepime dc'ed by critical care. Zosyn 3.375 gm q8h is appropriate for her renal fxn. No pcn allergies.

## 2012-03-25 NOTE — Care Management Note (Signed)
    Page 1 of 2   04/07/2012     2:15:56 PM   CARE MANAGEMENT NOTE 04/07/2012  Patient:  Laurie Sparks, Laurie Sparks   Account Number:  [id number]  Date Initiated:  03/25/2012  Documentation initiated by:  GRAVES-BIGELOW,Jerald Hennington  Subjective/Objective Assessment:   Pt admitted with cp. Pt is from home with 24 hour supervision provided by family. Pt has voiced concerns with suicidal ideations. Psychiatry is following pt.     Action/Plan:   CM will continue to monitor for medicaiton assistance for psych meds. Unsure if will be able to assist. Will f/u in am.   Anticipated DC Date:  03/27/2012   Anticipated DC Plan:  SKILLED NURSING FACILITY      DC Planning Services  CM consult      Choice offered to / List presented to:             Status of service:  Completed, signed off Medicare Important Message given?   (If response is "NO", the following Medicare IM given date fields will be blank) Date Medicare IM given:   Date Additional Medicare IM given:    Discharge Disposition:  SKILLED NURSING FACILITY  Per UR Regulation:  Reviewed for med. necessity/level of care/duration of stay  If discussed at Long Length of Stay Meetings, dates discussed:   04/02/2012  04/07/2012    Comments:  04-07-12 1414 Roselyn Bering- Mitzie Na, RN, BSN [phone] Plans for SNF placement. CSW working closely with pt on disposition needs. No further nneds for CM.   04/03/12 JULIE AMERSON,RN,BSN PT SUCCESSFULLY EXTUBATED TODAY.  WILL ASK PT AND OT TO EVALUATE TO DETERMINE LOC NEEDED AT DC.  WILL FOLLOW.  04/02/12 JULIE AMERSON,RN,BSN 1330 PT REMAINS INTUBATED; HESITANT TO EXTUBATE, AS PT HAS SEVERE COPD AND NEEDS GI PROCEDURE.  PT MAY NEED SNF FOR REHAB AT DC; WILL HAVE PT/OT FOLLOW UP WHEN EXTUBATED.  03/26/12 JULIE AMERSON,RN,BSN 1100 PT WITH HX OF COPD ADM TO ICU WITH RESP FAILURE SECONDARY TO PULM EDEMA, SEPTIC SHOCK.  PTA, PT LIVES WITH DAUGHTER AND SON IN LAW AND CHILDREN.  SHE IS ON CHRONIC OXYGEN AT HOME AND HAS A  WHEELCHAIR AT HOME.  SHE USES A CANE TO AMBULATE.  WILL FOLLOW FOR DISCHARGE NEEDS AS PT PROGRESSES.

## 2012-03-25 NOTE — Progress Notes (Signed)
Pt transferred to 3300 via bed and tele monitor. Family at bedside and went with pt. Nurses updated on arrival. Last BP 86/40 with HR 113 and sats 98% 3L [place].

## 2012-03-25 NOTE — Progress Notes (Signed)
Noticed pts HR SR in the 70-80s; walked into pts room, was pale and diaphoretic; pts BP manually 60s/30s, bolus gtt turned up, o2 sats 80s%, o2 turned up to 5 L-[place], rectal temp 100.9; NP paged and made aware, rapid called; currently pt ST 110s, BP 90s/60s, o2 90s% on 3l-[place]; new orders received, will continue to monitor

## 2012-03-25 NOTE — Progress Notes (Signed)
UR Completed Andres Escandon Graves-Bigelow, RN,BSN [phone]  

## 2012-03-25 NOTE — Progress Notes (Signed)
Called as second set of eyes to assist with patient with sudden onset elevated BP.  On arrival patient supine in bed.  Color pale. Skin hot to touch.  Seems to be having some fine rigors - she states she is freezing.  Arouses but disengaged. Resps slightly rapid - no distress .Patient just had consult session with pysch MD who reported she became anxious at end of session.    Denies pain - only slight SOB.  Bil BS present - clear - decreased LLL.  ST on 12 lead.  Abd soft non-distended.  Slight JVD present.  On 3 liter nasal cannula - her norm at home.  BP now 118/52 manual after IV dose Lopressor.  WBC up slightly this AM.  Rectal temp reveals 102.7 fever.  Patient c/o slight nausea - no vomiting.  Patient voided small amount foul smelling dk urine ? Icteric - some small blood clots noted - sent for UA.  Patient states some burning with voiding.  HR 110 RR 20 BP 118/52.  Question UTI.  Dr. Isidoro Donning notified by Lelon Mast RN.  Patient without acute distress.  Will watch.  Call as needed.

## 2012-03-25 NOTE — Progress Notes (Signed)
Report called to nurse receiving pt on 3300

## 2012-03-25 NOTE — Consult Note (Signed)
Patient Identification:  Laurie Sparks Date of Evaluation:  03/25/2012 Reason for Consult: suicidal ideation  Referring Provider: Dr. Isidoro Donning  History of Present Illness:   She says she came in after taking several series of pills, NTG, with no relief and came to ED with c/o chest pain.  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 She said she waited so long for relief of chest pain and headache that she made a statement about wanting to die.  She repeated this in her room.  The RN took safety measures and sitter was ordered  Past Psychiatric History: she says she made one suicide attempt many years ago.  She has seen a doctor who prescribed an antidepressant.  She could not afford it and has not taken any medication for some time.   Past Medical History:     Past Medical History  Diagnosis Date  . Coronary artery disease   . Myocardial infarct   . CHF (congestive heart failure)        Past Surgical History  Procedure Date  . Cholecystectomy   . Abdominal hysterectomy   . Ankle surgery     Allergies:  Allergies  Allergen Reactions  . Ambien (Zolpidem Tartrate) Other (See Comments)    Hallucinate   . Other     Base metal and something else    Current Medications:  Prior to Admission medications   Medication Sig Start Date End Date Taking? Authorizing Provider  aspirin EC 81 MG tablet Take 81 mg by mouth daily.   Yes Historical Provider, MD  carvedilol (COREG) 12.5 MG tablet Take 12.5 mg by mouth 2 (two) times daily with a meal.   Yes Historical Provider, MD  HYDROcodone-acetaminophen (NORCO) 7.5-325 MG per tablet Take 1 tablet by mouth 3 (three) times daily.   Yes Historical Provider, MD  nitroGLYCERIN (NITROSTAT) 0.4 MG SL tablet Place 0.4 mg under the tongue every 5 (five) minutes as needed. For chest pain   Yes Historical Provider, MD    Social History:    reports that she has never smoked. She does not have any smokeless tobacco history on file. She reports that she  does not drink alcohol or use illicit drugs.   Family History:    No family history on file.  Mental Status Examination/Evaluation: Objective:  Appearance: Casual  Psychomotor Activity:  Normal, Tremor and later in interview began to shake  Eye Contact::  Good  Speech:  Clear and Coherent  Volume:  Normal  Mood:  Anxious, Dysphoric and Hopeless  Affect:  Congruent, Constricted and Depressed  Thought Process:  Coherent, Relevant, Intact and focused upon children, their financial woes and need to die  Orientation:  Full  Thought Content:  Suicidal ideation and Delusions  Suicidal Thoughts:  No  Homicidal Thoughts:  No  Judgement:  Impaired  Insight:  Fair    DIAGNOSIS:   AXIS I  Major Depression recurrent, severe with suicidal ideation  AXIS II  Deffered  AXIS III See medical notes.  AXIS IV economic problems, other psychosocial or environmental problems, problems related to social environment, problems with primary support group and too reluctant to ask for help  AXIS V 51-60 moderate symptoms   Assessment/Plan: Discussed with Dr. Isidoro Donning and RN who made safety ck in room. Pt is lying in bed and cooperative with interview.  She says she lives alone.  She has 6 children.  Two sons are in town, unemployed.  Her daughter works only one day  a week. They are struggling to make ends meet She watches television but alludes to thought it is wasted time.  She says she wants to end her life to relieve her children form having to do things and buy things for her.  It is suggested she tell them her plan.  "Oh, no they would cry".  She thinks  They would be sad for a short time and get over it.  She is told that many adults met are saying they still feel very sad many years later.  She is encouraged to let them work things out the best they can because the worry about them is creating a bigger burden  For her.  She sounds disappointed and says "Then, I'll have to find another way"  This implies she  has not given up the decision to die.  She is carrying the psycho-social burdens of all her children and putting herself into a deeper depression.  She also has COPD and over time the cumulative hypoxia contributes to diminished cognitive function and depression. As the evaluation is ending, she begins to appear anxious and pursing her lips to breath.  She tries to raise her head and will lower it.  The RN is notified.  Pt has been in tachycardia HR 109 and now has BP 190/97 EKG is taken and Dr. Isidoro Donning is first called by MD and then by RN per Dr. Fransico Him request RECOMMENDATION:  1.  Respiratory distress secondary to persistent tachycardia and possible pt's distress talking first to CSW and then MD about family finances and problems for children prompting ideas of suicide.  2.  When stable, will discuss options for antidepressant that may reduce anxiety, worry and depression 3.  Pt is frail and appears undernourished - abnormal LFTs, hyperglycemia, normal CK MB, normal Hgb. 4.   She may require transfer to SNF to stabilize nutrition and ability to walk steadily to minimize falling when medically stable  5.   Pt has capacity to demonstrate orientation to person, place, situation but is unable to reality test about circumstances revolving around her children.  Pt will benefit from social interactions with others.                   6.  Will follow pt.  Jayden Rudge J. Ferol Luz, MD Psychiatrist  03/25/2012 3:07 PM

## 2012-03-25 NOTE — Progress Notes (Signed)
Called at 1926 for low blood pressure. Upon arrival to floor patient receiving fluid bolus on NRB, Patient lethargic but oriented, lung sounds clear bilateral, known UTI. Recheck BP 96/75, HR 112, 93% 3L, rectal temp 100.9, tylenol given, NP at bedside, orders received to transfer to stepdown, will continue to monitor, advised bedside RN to call if needed

## 2012-03-25 NOTE — Progress Notes (Signed)
pts BP 92/50 manually, HR ST 110s; pt lying in bed, states she does feel a little light headed; MD paged and made aware, orders received; will continue to monitor

## 2012-03-25 NOTE — Progress Notes (Signed)
CSW received referral for advanced directives. Per chart review, pt is expressing thoughts of suicidal ideations. Pt currently on suicidal precautions. CSW awaiting psych evaluation before assessing for advanced directives. .Clinical social worker continuing to follow pt to assist with pt dc plans and further csw needs.   Catha Gosselin, Theresia Majors  [phone] .03/25/2012 9:48am

## 2012-03-25 NOTE — Progress Notes (Addendum)
Patient ID: Laurie Sparks  female  ZOX:[id number]    DOB: 1941-04-04    DOA: 03/24/2012  PCP: Warrick Parisian, MD  Subjective: Patient states that chest/epigastric pain improving, daughter at the bedside. Patient also feels nauseated but denies any fever, chills, vomiting, diarrhea, constipation.  Objective: Weight change:   Intake/Output Summary (Last 24 hours) at 03/25/12 1415 Last data filed at 03/25/12 0800  Gross per 24 hour  Intake    120 ml  Output      0 ml  Net    120 ml   Blood pressure 149/75, pulse 110, temperature 98.8 F (37.1 C), temperature source Oral, resp. rate 26, height 5\' 1"  (1.549 m), weight 44.1 kg (97 lb 3.6 oz), SpO2 96.00%.  Physical Exam: General: Alert and awake, oriented, not in any acute distress. HEENT: anicteric sclera, pupils reactive to light and accommodation, EOMI CVS: S1-S2 clear, no murmur rubs or gallops Chest: Poor inspiratory effort, mild tenderness in the left chest area Abdomen: soft nontender, nondistended, normal bowel sounds, no organomegaly Extremities: no cyanosis, clubbing or edema noted bilaterally Neuro: Cranial nerves II-XII intact, no focal neurological deficits  Lab Results: Basic Metabolic Panel:  Lab 03/25/12 8119 03/24/12 1948  NA 138 138  K 3.9 4.7  CL 99 99  CO2 29 --  GLUCOSE 150* 168*  BUN 10 17  CREATININE 0.42* 0.70  CALCIUM 8.8 --  MG 1.6 --  PHOS -- --   Liver Function Tests:  Lab 03/25/12 0521  AST 360*  ALT 425*  ALKPHOS 239*  BILITOT 3.6*  PROT 6.0  ALBUMIN 3.5   CBC:  Lab 03/25/12 0521 03/24/12 1948 03/24/12 1940  WBC 11.6* -- 6.1  NEUTROABS -- -- 4.9  HGB 12.6 13.9 --  HCT 39.2 41.0 --  MCV 98.0 -- 98.2  PLT 179 -- 185   Cardiac Enzymes:  Lab 03/25/12 0927 03/25/12 0147 03/24/12 2154  CKTOTAL 35 37 --  CKMB 1.5 2.0 --  CKMBINDEX -- -- --  TROPONINI <0.30 <0.30 <0.30   BNP: No components found with this basename: POCBNP:2 CBG:  Lab 03/25/12 1132 03/25/12 0743 03/25/12 0143    GLUCAP 137* 146* 162*     Micro Results: No results found for this or any previous visit (from the past 240 hour(s)).  Studies/Results: Dg Chest 2 View  03/24/2012  *RADIOLOGY REPORT*  Clinical Data: Mid and left chest pain.  CHEST - 2 VIEW  Comparison: PA and lateral chest 07/26/2010.  Findings: Marked elevation of the left hemidiaphragm is again seen. The chest is hyperexpanded.  Blunting of the right costophrenic angle is unchanged.  Heart size is upper normal.  IMPRESSION: No acute finding.  Stable compared to prior exam.  Original Report Authenticated By: Bernadene Bell. Maricela Curet, M.D.    Medications: Scheduled Meds:   . antiseptic oral rinse  15 mL Mouth Rinse BID  . aspirin  324 mg Oral Once  . aspirin EC  81 mg Oral Daily  . carvedilol  12.5 mg Oral BID WC  . docusate sodium  100 mg Oral BID  . enoxaparin (LOVENOX) injection  30 mg Subcutaneous Daily  . HYDROcodone-acetaminophen  1 tablet Oral Q8H  . insulin aspart  0-9 Units Subcutaneous TID WC  . LORazepam  1 mg Intravenous Once  .  morphine injection  4 mg Intravenous Once  . nitroGLYCERIN  0.1 mg Transdermal Daily  . ondansetron (ZOFRAN) IV  4 mg Intravenous Once  . pantoprazole (PROTONIX) IV  40 mg Intravenous Once  .  sodium chloride  3 mL Intravenous Q12H  . DISCONTD:  morphine injection  4 mg Intravenous Once   Continuous Infusions:    Assessment/Plan: Principal Problem:  *Chest pain syndrome: Patient has a history of CAD status post MI in the past but differentials also include GERD vs esophagitis or gastritis vs chest wall tenderness. Patient's daughter also reports chronic pain due to her previous history of hiatal hernia repair which was complicated postop and required another surgery. - Cardiac enzymes so far negative, 2-D echo pending, cont PPI - Continue aspirin, Coreg, nitroglycerin, pain control  Suicidal ideation with history of depression: Per the daughter, patient has a history of depression due to her  medical issues, sister passing away. She feels that patient "did not mean it" when she mentioned suicidal ideation. - Continue sitter, psych consult has been called today a.m., we'll follow recommendations.  Active Problems:  CAD (coronary artery disease): See #1   COPD (chronic obstructive pulmonary disease): Stable   CHF (congestive heart failure): Appears to be compensated, 2-D echo pending   Vitamin D deficiency disease: Ordered PT eval  DVT Prophylaxis: Lovenox Code Status:  Disposition: hopefully tomorrow, ? Concern if she patient will require skilled nursing facility, will follow PT evaluation   LOS: 1 day   Nikayla Madaris M.D. [facility] 03/25/2012, 2:15 PM Pager: [phone]  If 7PM-7AM, please contact night-coverage [email or web address] Password [email or web address]  Addendum: Events today noted, the patient had significant tachycardia, acute anxiety attack with COPD after discussing the psychological issues with social worker and psychiatry, Dr. Ferol Luz. Patient also noted to have UTI on the urinalysis, Rocephin added, will follow cultures.   Marci Polito M.D. [facility] 03/25/2012, 4:24 PM  Pager: [phone]

## 2012-03-25 NOTE — Progress Notes (Signed)
Pt arrived to floor with Daughter at bedside. Pt admits to having thoughts of harming herself and reports of thinking about it this morning. Pt states "if I eliminate myself I will not be a burden to anyone".  Pt asked about a plan of action and pt responds "dont worry about that". Suicide policy and procedure followed. Sitter at bedside. Pt in bed resting, will continue to monitor.

## 2012-03-25 NOTE — Progress Notes (Signed)
Clinical Social Work Department CLINICAL SOCIAL WORK PSYCHIATRY SERVICE LINE ASSESSMENT 03/25/2012  Patient:  Laurie Sparks  Account:  [id number]  Admit Date:  03/24/2012  Clinical Social Worker:  Unk Lightning, LCSW  Date/Time:  03/25/2012 02:00 PM Referred by:  Physician  Date referred:  03/25/2012 Reason for Referral  Behavioral Health Issues   Presenting Symptoms/Problems (In the person's/family's own words):   "I said I wanted to hurt myself"    Patient disclosed to RN that she was going to hurt herself. Patient was placed on suicide precautions.   Abuse/Neglect/Trauma History (check all that apply)  Denies history   Abuse/Neglect/Trauma Comments:   Psychiatric History (check all that apply)  Outpatient treatment   Psychiatric medications:  None currently   Current Mental Health Hospitalizations/Previous Mental Health History:   Paitent reports that she was not hospitalized but reports she was taking an antidepressant about 7 years ago and took medication for about 1.5 years.   Current provider:   PCP prescribed antidepressant in the past   Place and Date:   N/A   Current Medications:   sodium chloride, acetaminophen, HYDROcodone-acetaminophen, morphine injection, nitroGLYCERIN, ondansetron, sodium chloride, DISCONTD: ondansetron (ZOFRAN) IV                        . antiseptic oral rinse  15 mL Mouth Rinse BID  . aspirin  324 mg Oral Once  . aspirin EC  81 mg Oral Daily  . carvedilol  12.5 mg Oral BID WC  . docusate sodium  100 mg Oral BID  . enoxaparin (LOVENOX) injection  30 mg Subcutaneous Daily  . HYDROcodone-acetaminophen  1 tablet Oral Q8H  . insulin aspart  0-9 Units Subcutaneous TID WC  . LORazepam  1 mg Intravenous Once  .  morphine injection  4 mg Intravenous Once  . nitroGLYCERIN  0.1 mg Transdermal Daily  . ondansetron (ZOFRAN) IV  4 mg Intravenous Once  . pantoprazole (PROTONIX) IV  40 mg Intravenous Once  . sodium chloride  3 mL Intravenous Q12H  .  DISCONTD:  morphine injection  4 mg Intravenous Once   Previous Impatient Admission/Date/Reason:   None reported   Emotional Health / Current Symptoms    Suicide/Self Harm  Suicidal ideation (ex: "I can't take any more,I wish I could disappear")  Suicide attempt in past (date/description)   Suicide attempt in the past:   Patient reports over 20 years she attempted suicide   Other harmful behavior:   Patient reports having SI but reports no plan and no active SI at this time   Psychotic/Dissociative Symptoms  None reported   Other Psychotic/Dissociative Symptoms:    Attention/Behavioral Symptoms  Within Normal Limits   Other Attention / Behavioral Symptoms:    Cognitive Impairment  Within Normal Limits   Other Cognitive Impairment:    Mood and Adjustment  Flat    Stress, Anxiety, Trauma, Any Recent Loss/Stressor  Relationship   Anxiety (frequency):   Phobia (specify):   Compulsive behavior (specify):   Obsessive behavior (specify):   Other:   Patient is worried about financial concerns and has limited social support   Substance Abuse/Use  None   SBIRT completed (please refer for detailed history):  N  Self-reported substance use:   N/A   Urinary Drug Screen Completed:  N Alcohol level:   N/A    Environmental/Housing/Living Arrangement  Stable housing   Who is in the home:   Patient lives alone   Emergency  contact:  Heather-dtr   Financial  Medicare   Patient's Strengths and Goals (patient's own words):   "I would be willing to take an antidepressant again"    Patient reports that she did talk about depression with PCP in the past. Patient was compliant with medication until she was unable to afford medication. Patient has 6 children and several that live in the area.   Clinical Social Worker's Interpretive Summary:   CSW received referral due to patient disclosing SI to RN. CSW reviewed chart and met with patient in room. No visitors were  present. Sitter in room.    CSW introduced myself and explained role. Patient reported that she came to the hospital because she was having chest pain. Patient reported that when she came to the hospital she was frustrated because she was not feeling better and made a comment that she would hurt herself. Patient reports that now that she is feeling better and not in pain she does not have any SI. Patient reports that she did not really mean that she would hurt herself but was just frustrated with the situation. Patient reports that about 7 years ago her PCP diagnosed her with depression. Patient stated that PCP prescribed antidepressant medication but stated that she could no longer afford the medication so she stopped taking her medication about 5 years ago. Patient is agreeable to take antidepressant medication again if it is covered under her insurance. CM agreed to research medication and inform patient of any copays she might have. Patient has never gone to therapy and is not interested in any counseling at this time.    CSW and patient processed patient's feelings of hopelessness and depression. Patient contributed depression to feeling like a burden on children and lacking supports. Patient reports children are helpful and assist physically and financially. Patient reports that she is not comfortable discussing emotions with children because she does not want them to worry about her. Patient reports feeling isolated. CSW suggested social groups or church but patient reports she does not have the means to get to these groups and does not want to ask her children for help. Patient has 6 children. 2 sons live in [place] who are both unemployed. Patient's dtr also lives in [place] and works 1 day a week. Dtr is able to transport patient if needed. CSW provided patient with referral to Department of Social Services to inquire about food stamps and Medicaid. Patient was agreeable to ask dtr for  transportation to DSS. Patient feels that depression is directly related to finances and feels if she could become more financially stable then her depression would be cured.    Patient was flat during assessment with limited eye contact. Patient has several social stressors. Patient is not receptive to outpatient follow up or decreasing her isolation. Patient answered appropriately and receptive to assessment. Patient is able to identify that she does not want to hurt herself because it would cause pain to her children. Patient relies on her religious beliefs and prays when feeling overwhelmed.    CSW updated psych MD on patient's needs.   Disposition:  Psych Clinical Social Worker signing off

## 2012-03-26 ENCOUNTER — Inpatient Hospital Stay (HOSPITAL_COMMUNITY): Payer: Medicare Other

## 2012-03-26 DIAGNOSIS — R509 Fever, unspecified: Secondary | ICD-10-CM

## 2012-03-26 DIAGNOSIS — I959 Hypotension, unspecified: Secondary | ICD-10-CM

## 2012-03-26 DIAGNOSIS — R079 Chest pain, unspecified: Secondary | ICD-10-CM

## 2012-03-26 DIAGNOSIS — J9601 Acute respiratory failure with hypoxia: Secondary | ICD-10-CM | POA: Diagnosis not present

## 2012-03-26 DIAGNOSIS — I2510 Atherosclerotic heart disease of native coronary artery without angina pectoris: Secondary | ICD-10-CM

## 2012-03-26 DIAGNOSIS — R6521 Severe sepsis with septic shock: Secondary | ICD-10-CM

## 2012-03-26 DIAGNOSIS — N390 Urinary tract infection, site not specified: Secondary | ICD-10-CM

## 2012-03-26 DIAGNOSIS — A419 Sepsis, unspecified organism: Principal | ICD-10-CM | POA: Diagnosis present

## 2012-03-26 DIAGNOSIS — J9600 Acute respiratory failure, unspecified whether with hypoxia or hypercapnia: Secondary | ICD-10-CM

## 2012-03-26 DIAGNOSIS — J449 Chronic obstructive pulmonary disease, unspecified: Secondary | ICD-10-CM

## 2012-03-26 DIAGNOSIS — I509 Heart failure, unspecified: Secondary | ICD-10-CM

## 2012-03-26 LAB — CBC WITH DIFFERENTIAL/PLATELET
Band Neutrophils: 0 % (ref 0–10)
Basophils Absolute: 0 10*3/uL (ref 0.0–0.1)
Basophils Relative: 0 % (ref 0–1)
Blasts: 0 %
Eosinophils Absolute: 0 10*3/uL (ref 0.0–0.7)
Eosinophils Relative: 0 % (ref 0–5)
HCT: 31.8 % — ABNORMAL LOW (ref 36.0–46.0)
Hemoglobin: 10.3 g/dL — ABNORMAL LOW (ref 12.0–15.0)
Lymphocytes Relative: 2 % — ABNORMAL LOW (ref 12–46)
Lymphs Abs: 0.3 10*3/uL — ABNORMAL LOW (ref 0.7–4.0)
MCH: 31.8 pg (ref 26.0–34.0)
MCHC: 32.4 g/dL (ref 30.0–36.0)
MCV: 98.1 fL (ref 78.0–100.0)
Metamyelocytes Relative: 0 %
Monocytes Absolute: 0.5 10*3/uL (ref 0.1–1.0)
Monocytes Relative: 3 % (ref 3–12)
Myelocytes: 0 %
Neutro Abs: 14.2 10*3/uL — ABNORMAL HIGH (ref 1.7–7.7)
Neutrophils Relative %: 95 % — ABNORMAL HIGH (ref 43–77)
Platelets: 109 10*3/uL — ABNORMAL LOW (ref 150–400)
Promyelocytes Absolute: 0 %
RBC: 3.24 MIL/uL — ABNORMAL LOW (ref 3.87–5.11)
RDW: 13.6 % (ref 11.5–15.5)
WBC Morphology: INCREASED
WBC: 15 10*3/uL — ABNORMAL HIGH (ref 4.0–10.5)
nRBC: 0 /100 WBC

## 2012-03-26 LAB — BASIC METABOLIC PANEL
BUN: 20 mg/dL (ref 6–23)
Calcium: 8.1 mg/dL — ABNORMAL LOW (ref 8.4–10.5)
Creatinine, Ser: 0.55 mg/dL (ref 0.50–1.10)
GFR calc Af Amer: >90 mL/min (ref >90)
GFR calc non Af Amer: >90 mL/min (ref >90)
Glucose, Bld: 142 mg/dL — ABNORMAL HIGH (ref 70–99)

## 2012-03-26 LAB — AMYLASE: Amylase: 14 U/L (ref 0–105)

## 2012-03-26 LAB — CBC
HCT: 32.6 % — ABNORMAL LOW (ref 36.0–46.0)
Hemoglobin: 11.2 g/dL — ABNORMAL LOW (ref 12.0–15.0)
Hemoglobin: 10.3 g/dL — ABNORMAL LOW (ref 12.0–15.0)
MCH: 31.2 pg (ref 26.0–34.0)
MCHC: 32 g/dL (ref 30.0–36.0)
MCV: 96.7 fL (ref 78.0–100.0)
Platelets: 127 10*3/uL — ABNORMAL LOW (ref 150–400)
RDW: 13.8 % (ref 11.5–15.5)
WBC: 4.4 10*3/uL (ref 4.0–10.5)

## 2012-03-26 LAB — PROTIME-INR
INR: 1.47 (ref 0.00–1.49)
Prothrombin Time: 18.1 seconds — ABNORMAL HIGH (ref 11.6–15.2)

## 2012-03-26 LAB — COMPREHENSIVE METABOLIC PANEL
ALT: 216 U/L — ABNORMAL HIGH (ref 0–35)
AST: 117 U/L — ABNORMAL HIGH (ref 0–37)
Albumin: 2.5 g/dL — ABNORMAL LOW (ref 3.5–5.2)
Alkaline Phosphatase: 143 U/L — ABNORMAL HIGH (ref 39–117)
BUN: 19 mg/dL (ref 6–23)
CO2: 20 mEq/L (ref 19–32)
Calcium: 7.6 mg/dL — ABNORMAL LOW (ref 8.4–10.5)
Chloride: 103 mEq/L (ref 96–112)
Creatinine, Ser: 0.55 mg/dL (ref 0.50–1.10)
GFR calc Af Amer: >90 mL/min (ref >90)
GFR calc non Af Amer: >90 mL/min (ref >90)
Glucose, Bld: 109 mg/dL — ABNORMAL HIGH (ref 70–99)
Potassium: 3.4 mEq/L — ABNORMAL LOW (ref 3.5–5.1)
Sodium: 137 mEq/L (ref 135–145)
Total Bilirubin: 3.4 mg/dL — ABNORMAL HIGH (ref 0.3–1.2)
Total Protein: 4.8 g/dL — ABNORMAL LOW (ref 6.0–8.3)

## 2012-03-26 LAB — BLOOD GAS, ARTERIAL
Acid-base deficit: 2.9 mmol/L — ABNORMAL HIGH (ref 0.0–2.0)
Bicarbonate: 21.5 mEq/L (ref 20.0–24.0)
Drawn by: 31787
O2 Content: 4 L/min
O2 Saturation: 93.3 %
Patient temperature: 99.7
TCO2: 22.7 mmol/L (ref 0–100)
pCO2 arterial: 39.1 mmHg (ref 35.0–45.0)
pH, Arterial: 7.363 (ref 7.350–7.450)
pO2, Arterial: 65 mmHg — ABNORMAL LOW (ref 80.0–100.0)

## 2012-03-26 LAB — LACTIC ACID, PLASMA: Lactic Acid, Venous: 2.5 mmol/L — ABNORMAL HIGH (ref 0.5–2.2)

## 2012-03-26 LAB — CARDIAC PANEL(CRET KIN+CKTOT+MB+TROPI)
CK, MB: 2 ng/mL (ref 0.3–4.0)
Relative Index: 1.9 (ref 0.0–2.5)
Relative Index: 2 (ref 0.0–2.5)
Total CK: 103 U/L (ref 7–177)
Troponin I: <0.3 ng/mL (ref <0.30)

## 2012-03-26 LAB — GLUCOSE, CAPILLARY
Glucose-Capillary: 130 mg/dL — ABNORMAL HIGH (ref 70–99)
Glucose-Capillary: 99 mg/dL (ref 70–99)

## 2012-03-26 LAB — PROCALCITONIN: Procalcitonin: 11.59 ng/mL

## 2012-03-26 LAB — POCT I-STAT 3, ART BLOOD GAS (G3+)
Bicarbonate: 20 mEq/L (ref 20.0–24.0)
Patient temperature: 98.8
TCO2: 21 mmol/L (ref 0–100)
pCO2 arterial: 35.5 mmHg (ref 35.0–45.0)
pH, Arterial: 7.359 (ref 7.350–7.450)

## 2012-03-26 LAB — CARBOXYHEMOGLOBIN: O2 Saturation: 78.9 %

## 2012-03-26 LAB — APTT: aPTT: 37 seconds (ref 24–37)

## 2012-03-26 LAB — TYPE AND SCREEN: ABO/RH(D): O POS

## 2012-03-26 MED ORDER — ROCURONIUM BROMIDE 50 MG/5ML IV SOLN
50.0000 mg | Freq: Once | INTRAVENOUS | Status: AC
  Administered 2012-03-26: 50 mg via INTRAVENOUS

## 2012-03-26 MED ORDER — INSULIN ASPART 100 UNIT/ML ~~LOC~~ SOLN
0.0000 [IU] | SUBCUTANEOUS | Status: DC
  Administered 2012-03-26 (×2): 1 [IU] via SUBCUTANEOUS
  Administered 2012-03-27 (×3): 2 [IU] via SUBCUTANEOUS
  Administered 2012-03-28: 0 [IU] via SUBCUTANEOUS
  Administered 2012-03-28: 2 [IU] via SUBCUTANEOUS
  Administered 2012-03-29 (×4): 1 [IU] via SUBCUTANEOUS

## 2012-03-26 MED ORDER — POTASSIUM CHLORIDE 20 MEQ/15ML (10%) PO LIQD
40.0000 meq | Freq: Once | ORAL | Status: AC
  Administered 2012-03-26: 40 meq via ORAL
  Filled 2012-03-26: qty 30

## 2012-03-26 MED ORDER — IOHEXOL 300 MG/ML  SOLN
20.0000 mL | INTRAMUSCULAR | Status: AC
  Administered 2012-03-26 (×2): 20 mL via ORAL

## 2012-03-26 MED ORDER — SODIUM CHLORIDE 0.9 % IV BOLUS (SEPSIS)
1000.0000 mL | Freq: Once | INTRAVENOUS | Status: AC
  Administered 2012-03-26: 1000 mL via INTRAVENOUS

## 2012-03-26 MED ORDER — BIOTENE DRY MOUTH MT LIQD
15.0000 mL | Freq: Four times a day (QID) | OROMUCOSAL | Status: DC
  Administered 2012-03-26 – 2012-04-03 (×32): 15 mL via OROMUCOSAL

## 2012-03-26 MED ORDER — PIPERACILLIN-TAZOBACTAM 3.375 G IVPB 30 MIN
3.3750 g | Freq: Once | INTRAVENOUS | Status: AC
  Administered 2012-03-26: 3.375 g via INTRAVENOUS
  Filled 2012-03-26: qty 50

## 2012-03-26 MED ORDER — SODIUM CHLORIDE 0.9 % IV BOLUS (SEPSIS): 500.0000 mL | Freq: Once | INTRAVENOUS | Status: AC

## 2012-03-26 MED ORDER — SODIUM CHLORIDE 0.9 % IV BOLUS (SEPSIS): 500.0000 mL | INTRAVENOUS | Status: DC | PRN

## 2012-03-26 MED ORDER — SODIUM CHLORIDE 0.9 % IV BOLUS (SEPSIS)
500.0000 mL | Freq: Once | INTRAVENOUS | Status: AC
  Administered 2012-03-26: 500 mL via INTRAVENOUS

## 2012-03-26 MED ORDER — ALBUTEROL SULFATE HFA 108 (90 BASE) MCG/ACT IN AERS
4.0000 | INHALATION_SPRAY | RESPIRATORY_TRACT | Status: DC | PRN
  Administered 2012-03-26 – 2012-03-27 (×2): 4 via RESPIRATORY_TRACT
  Filled 2012-03-26: qty 6.7

## 2012-03-26 MED ORDER — PHENYLEPHRINE HCL 10 MG/ML IJ SOLN
30.0000 ug/min | INTRAVENOUS | Status: DC
  Administered 2012-03-26: 180 ug/min via INTRAVENOUS
  Administered 2012-03-27: 130.133 ug/min via INTRAVENOUS
  Administered 2012-03-27 (×2): 130 ug/min via INTRAVENOUS
  Administered 2012-03-28: 110 ug/min via INTRAVENOUS
  Filled 2012-03-26 (×8): qty 4

## 2012-03-26 MED ORDER — POTASSIUM CHLORIDE 10 MEQ/100ML IV SOLN: 10.0000 meq | INTRAVENOUS | Status: DC

## 2012-03-26 MED ORDER — POTASSIUM CHLORIDE 20 MEQ/15ML (10%) PO LIQD
ORAL | Status: AC
  Filled 2012-03-26: qty 30

## 2012-03-26 MED ORDER — POTASSIUM CHLORIDE 10 MEQ/50ML IV SOLN
INTRAVENOUS | Status: AC
  Filled 2012-03-26: qty 100

## 2012-03-26 MED ORDER — NOREPINEPHRINE BITARTRATE 1 MG/ML IJ SOLN
2.0000 ug/min | INTRAVENOUS | Status: DC | PRN
  Administered 2012-03-26 (×2): 2 ug/min via INTRAVENOUS
  Filled 2012-03-26 (×2): qty 4

## 2012-03-26 MED ORDER — FENTANYL BOLUS VIA INFUSION
50.0000 ug | Freq: Four times a day (QID) | INTRAVENOUS | Status: DC | PRN
  Administered 2012-03-27: 50 ug via INTRAVENOUS
  Filled 2012-03-26: qty 100

## 2012-03-26 MED ORDER — SODIUM CHLORIDE 0.9 % IV SOLN
50.0000 ug/h | INTRAVENOUS | Status: DC
  Administered 2012-03-26: 75 ug/h
  Administered 2012-03-27: 50 ug/h via INTRAVENOUS
  Administered 2012-03-28: 100 ug/h via INTRAVENOUS
  Administered 2012-03-30: 50 ug/h via INTRAVENOUS
  Filled 2012-03-26 (×4): qty 50

## 2012-03-26 MED ORDER — IOHEXOL 300 MG/ML  SOLN
80.0000 mL | Freq: Once | INTRAMUSCULAR | Status: AC | PRN
  Administered 2012-03-26: 80 mL via INTRAVENOUS

## 2012-03-26 MED ORDER — PANTOPRAZOLE SODIUM 40 MG PO PACK
40.0000 mg | PACK | Freq: Two times a day (BID) | ORAL | Status: DC
  Administered 2012-03-26 – 2012-04-02 (×14): 40 mg via ORAL
  Filled 2012-03-26 (×20): qty 20

## 2012-03-26 MED ORDER — POTASSIUM CHLORIDE 10 MEQ/50ML IV SOLN
10.0000 meq | INTRAVENOUS | Status: AC
  Administered 2012-03-26 (×2): 10 meq via INTRAVENOUS

## 2012-03-26 MED ORDER — CHLORHEXIDINE GLUCONATE 0.12 % MT SOLN
15.0000 mL | Freq: Two times a day (BID) | OROMUCOSAL | Status: DC
  Administered 2012-03-26 – 2012-04-03 (×18): 15 mL via OROMUCOSAL
  Filled 2012-03-26 (×18): qty 15

## 2012-03-26 MED ORDER — SODIUM CHLORIDE 0.9 % IV SOLN
INTRAVENOUS | Status: DC
  Administered 2012-03-26: 10:00:00 via INTRAVENOUS
  Administered 2012-03-29: 35 mL/h via INTRAVENOUS
  Administered 2012-03-30: 20 mL/h via INTRAVENOUS

## 2012-03-26 MED ORDER — PIPERACILLIN-TAZOBACTAM 3.375 G IVPB
3.3750 g | Freq: Three times a day (TID) | INTRAVENOUS | Status: DC
  Administered 2012-03-26 – 2012-03-28 (×8): 3.375 g via INTRAVENOUS
  Filled 2012-03-26 (×11): qty 50

## 2012-03-26 MED ORDER — SODIUM CHLORIDE 0.9 % IV BOLUS (SEPSIS): 25.0000 mL/kg | Freq: Once | INTRAVENOUS | Status: DC

## 2012-03-26 MED ORDER — ALBUTEROL SULFATE HFA 108 (90 BASE) MCG/ACT IN AERS
4.0000 | INHALATION_SPRAY | RESPIRATORY_TRACT | Status: DC
  Administered 2012-03-26 – 2012-04-03 (×49): 4 via RESPIRATORY_TRACT
  Filled 2012-03-26 (×4): qty 6.7

## 2012-03-26 MED ORDER — DOBUTAMINE IN D5W 4-5 MG/ML-% IV SOLN
2.5000 ug/kg/min | INTRAVENOUS | Status: DC | PRN
  Filled 2012-03-26: qty 250

## 2012-03-26 MED ORDER — NOREPINEPHRINE BITARTRATE 1 MG/ML IJ SOLN
2.0000 ug/min | INTRAVENOUS | Status: DC
  Filled 2012-03-26: qty 4

## 2012-03-26 MED ORDER — DEXTROSE 10 % IV SOLN: INTRAVENOUS | Status: DC | PRN

## 2012-03-26 MED ORDER — FENTANYL CITRATE 0.05 MG/ML IJ SOLN
25.0000 ug | INTRAMUSCULAR | Status: DC | PRN
  Administered 2012-03-26: 50 ug via INTRAVENOUS
  Filled 2012-03-26 (×2): qty 2

## 2012-03-26 MED ORDER — PHENYLEPHRINE HCL 10 MG/ML IJ SOLN
30.0000 ug/min | INTRAVENOUS | Status: DC
  Administered 2012-03-26: 30 ug/min via INTRAVENOUS
  Administered 2012-03-26: 115 ug/min via INTRAVENOUS
  Administered 2012-03-26: 100 ug/min via INTRAVENOUS
  Administered 2012-03-26: 125 ug/min via INTRAVENOUS
  Filled 2012-03-26 (×5): qty 2

## 2012-03-26 MED ORDER — MORPHINE SULFATE ER 80 MG PO CP24: 80.0000 mg | ORAL_CAPSULE | Freq: Every day | ORAL | Status: DC

## 2012-03-26 MED ORDER — MIDAZOLAM HCL 2 MG/2ML IJ SOLN
1.0000 mg | INTRAMUSCULAR | Status: DC | PRN
  Administered 2012-03-26 (×2): 2 mg via INTRAVENOUS
  Administered 2012-03-28: 1 mg via INTRAVENOUS
  Administered 2012-03-28: 2 mg via INTRAVENOUS
  Administered 2012-03-29 (×2): 1 mg via INTRAVENOUS
  Administered 2012-03-30 – 2012-03-31 (×4): 2 mg via INTRAVENOUS
  Filled 2012-03-26 (×10): qty 2

## 2012-03-26 MED ORDER — IPRATROPIUM BROMIDE 0.02 % IN SOLN: 0.5000 mg | RESPIRATORY_TRACT | Status: DC | PRN

## 2012-03-26 MED ORDER — ALBUTEROL SULFATE (5 MG/ML) 0.5% IN NEBU: 2.5000 mg | INHALATION_SOLUTION | RESPIRATORY_TRACT | Status: DC | PRN

## 2012-03-26 MED ORDER — ETOMIDATE 2 MG/ML IV SOLN
20.0000 mg | Freq: Once | INTRAVENOUS | Status: AC
  Administered 2012-03-26: 20 mg via INTRAVENOUS

## 2012-03-26 MED ORDER — SODIUM CHLORIDE 0.9 % IV SOLN: INTRAVENOUS | Status: DC

## 2012-03-26 MED ORDER — PIPERACILLIN-TAZOBACTAM 3.375 G IVPB
3.3750 g | Freq: Three times a day (TID) | INTRAVENOUS | Status: DC
  Filled 2012-03-26 (×3): qty 50

## 2012-03-26 MED ORDER — FAMOTIDINE IN NACL 20-0.9 MG/50ML-% IV SOLN: 20.0000 mg | Freq: Two times a day (BID) | INTRAVENOUS | Status: DC

## 2012-03-26 MED ORDER — VASOPRESSIN 20 UNIT/ML IJ SOLN
0.0300 [IU]/min | INTRAVENOUS | Status: DC
  Administered 2012-03-26 – 2012-03-28 (×4): 0.03 [IU]/min via INTRAVENOUS
  Filled 2012-03-26 (×3): qty 2.5

## 2012-03-26 NOTE — Progress Notes (Signed)
Hypokalemia   K replaced with 2 runs; might benefit from further replacement in the afternoon. CMP ordered for 10:00 am

## 2012-03-26 NOTE — Consult Note (Signed)
Name: Laurie Sparks MRN: [id number] DOB: [birth year]    LOS: 2  Referring Provider:  Maryan Puls Reason for Referral:  Shock, hypoxemia  PULMONARY / CRITICAL CARE MEDICINE  HPI:  This is a 71 y/o female with known CHF and CAD and multiple abdominal surgeries in the past was admitted to [facility] on 8/14 with chest pain, nausea and vomiting.  She was found to have a UTI after admission and was started on ceftriaxone.  Later in the evening on 8/14 she developed hypotension, hypoxemia, and tachycardia with a fever.  She states that her abdominal pain is pressure like, comes and goes and is associated with nausea.  She has not aspirated this evening.  Her last bowel movement was on 8/14 early AM.  PCCM consulted for further evaluation.  Past Medical History  Diagnosis Date  . Coronary artery disease   . Myocardial infarct   . CHF (congestive heart failure)    Past Surgical History  Procedure Date  . Cholecystectomy   . Abdominal hysterectomy   . Ankle surgery    Prior to Admission medications   Medication Sig Start Date End Date Taking? Authorizing Provider  aspirin EC 81 MG tablet Take 81 mg by mouth daily.   Yes Historical Provider, MD  carvedilol (COREG) 12.5 MG tablet Take 12.5 mg by mouth 2 (two) times daily with a meal.   Yes Historical Provider, MD  HYDROcodone-acetaminophen (NORCO) 7.5-325 MG per tablet Take 1 tablet by mouth 3 (three) times daily.   Yes Historical Provider, MD  nitroGLYCERIN (NITROSTAT) 0.4 MG SL tablet Place 0.4 mg under the tongue every 5 (five) minutes as needed. For chest pain   Yes Historical Provider, MD   Allergies Allergies  Allergen Reactions  . Ambien (Zolpidem Tartrate) Other (See Comments)    Hallucinate   . Other     Base metal and something else    Family History No family history on file. Social History  reports that she has never smoked. She does not have any smokeless tobacco history on file. She reports that she does not drink alcohol or use  illicit drugs.  Review Of Systems:   Gen: Denies fever, chills, weight change, fatigue, night sweats HEENT: Denies blurred vision, double vision, hearing loss, tinnitus, sinus congestion, rhinorrhea, sore throat, neck stiffness, dysphagia PULM: Increasing shortness of breath, no cough, sputum production, hemoptysis, wheezing CV: Notable for chest pain, denies edema, orthopnea, paroxysmal nocturnal dyspnea, palpitations GI: per hpi GU: Denies dysuria, hematuria, polyuria, oliguria, urethral discharge Endocrine: Denies hot or cold intolerance, polyuria, polyphagia or appetite change Derm: Denies rash, dry skin, scaling or peeling skin change Heme: Denies easy bruising, bleeding, bleeding gums Neuro: Denies headache, numbness, weakness, slurred speech, loss of memory or consciousness   Brief patient description:  71 y/o female with CHF and CAD admitted 8/14 with chest pain developed hypotension, tachycardia and worsening hypoxemic respiratory failure likely in the setting of sepsis.  DDx sepsis includes UTI vs. Cholangitis vs. Less likely SBO/abdominal process.  Events Since Admission:   Current Status:  Vital Signs: Temp:  [98.5 F (36.9 C)-100.9 F (38.3 C)] 99.7 F (37.6 C) (08/15 0030) Pulse Rate:  [104-129] 120  (08/15 0030) Resp:  [16-28] 24  (08/15 0030) BP: (86-198)/(35-97) 91/44 mmHg (08/15 0030) SpO2:  [92 %-100 %] 96 % (08/15 0030) Weight:  [44 kg (97 lb)-44.1 kg (97 lb 3.6 oz)] 44 kg (97 lb) (08/14 2055) On 4 L [place]  Physical Examination: Gen: chronically ill appearing, tachypnic  HEENT: NCAT, PERRL, EOMi, OP clear, neck supple without masses PULM: Insp crackles in bases bilaterally CV: Tachy, regulard, no mgr, markedly elevated JVD AB: BS absent, soft, diffusely tender but no guarding or rebound, no hsm Ext: warm, no edema, no clubbing, no cyanosis Derm: no rash or skin breakdown Neuro: A&Ox4, CN II-XII intact, strength 5/5 in all 4 extremities   Principal  Problem:  *Chest pain syndrome Active Problems:  CAD (coronary artery disease)  COPD (chronic obstructive pulmonary disease)  Anxiety  CHF (congestive heart failure)  Vitamin D deficiency disease  Suicidal ideation  UTI (lower urinary tract infection)  Hypotension  Fever  Septic shock   ASSESSMENT AND PLAN  PULMONARY  Lab 03/26/12 0040  PHART 7.363  PCO2ART 39.1  PO2ART 65.0*  HCO3 21.5  O2SAT 93.3   Ventilator Settings:   CXR:  Bilateral pulmonary edema, bilateral effusions ETT:  n/a  A:  Acute on chronic hypoxemic respiratory failure due to pulmonary edema with increased work of breathing; COPD on 3 L O2 at home P:   -hold further volume resuscitation -intubate, full vent support -nebs prn  CARDIOVASCULAR  Lab 03/25/12 2200 03/25/12 1742 03/25/12 0927 03/25/12 0147 03/24/12 2154  TROPONINI -- <0.30 <0.30 <0.30 <0.30  LATICACIDVEN 3.5* -- -- -- --  PROBNP -- -- -- -- --   ECG:  Sinus tach, peaked T waves, no ST wave changes Lines:  03/26/12 R IJ CVL >> 03/25/12 2D TTE LVEF 60-65%, elevated PA pressure  A: Septic shock and currently volume overloaded based on physical exam and CXR; question mixed shock (cardiogenic?) given exam findings; CAD but ruled out for MI, 03/25/12 TTE normal P:  -EGDT, but no further volume resuscitation -see ID -check CVP, coox -levophed for shock -repeat lactic acid after resuscitation   RENAL  Lab 03/25/12 0521 03/24/12 1948  NA 138 138  K 3.9 4.7  CL 99 99  CO2 29 --  BUN 10 17  CREATININE 0.42* 0.70  CALCIUM 8.8 --  MG 1.6 --  PHOS -- --   Intake/Output      08/14 0701 - 08/15 0700   P.O. 240   I.V. (mL/kg) 3 (0.1)   Total Intake(mL/kg) 243 (5.5)   Net +243        Foley:  03/25/12  A:  No acute issues P:   -follow UOP -repeat BMET  GASTROINTESTINAL  Lab 03/25/12 0521  AST 360*  ALT 425*  ALKPHOS 239*  BILITOT 3.6*  PROT 6.0  ALBUMIN 3.5    A:  Elevated transaminases and alk phos of  uncertain etiology worrisome for cholangitis given fever, elevated WBC Also with absent bowel sounds, history of multiple abdominal surgeries, worrisome for SBO P:   -OG tube to suction -CT abdomen pelvis -see ID  HEMATOLOGIC  Lab 03/25/12 0521 03/24/12 1948 03/24/12 1940  HGB 12.6 13.9 12.5  HCT 39.2 41.0 38.9  PLT 179 -- 185  INR -- -- --  APTT -- -- --   A:  No acute issues P:  Monitor for bleeding  INFECTIOUS  Lab 03/25/12 2200 03/25/12 0521 03/24/12 1940  WBC -- 11.6* 6.1  PROCALCITON 9.25 -- --   Cultures: 03/25/12 blood >> 03/25/12 urine >>  Antibiotics: 8/14 zosyn (UTI, Cholangitis)>> 8/14 vanc (empiric sepsi) >>  A:  Septic shock, ddx UTI, cholangitis, bowel perf? P:   -antibiotics, cultures as above -CT abdomen  ENDOCRINE  Lab 03/25/12 2145 03/25/12 2007 03/25/12 1616 03/25/12 1439 03/25/12 1132  GLUCAP  100* 91 151* 191* 137*   A:  Hyperglycemia P:   -ICU hyperglycemia protocol  NEUROLOGIC  A:  Suicidality on 8/14 P:   -psyche to follow up after critical illness  BEST PRACTICE / DISPOSITION Level of Care:  ICU Primary Service:  PCCM Consultants:  psych Code Status:  full Diet:  npo DVT Px:  lovenox GI Px:  ppi Skin Integrity:  normal Social / Family:  Updated son at bedside  CC time 90 minutes  MCQUAID, Riley Lam, M.D. Pulmonary and Critical Care Medicine [facility] Pager: [phone]  03/26/2012, 1:11 AM

## 2012-03-26 NOTE — Progress Notes (Signed)
Md increased VT to 450 and reduced RR to 14. Settings already changed when RT entered room.

## 2012-03-26 NOTE — Progress Notes (Addendum)
CSW spoke with psych service line csw, that pt was appropriate for advanced directives. However, per discussion with csw colleague, pt rn was requesting that pt did not have any further visitors as patient was not feeling well. CSW is awaiting more appropriate time to assess for completing advanced directives.    .Clinical social worker continuing to follow pt to assist with pt dc plans and further csw needs.   Catha Gosselin, Theresia Majors  [phone] .03/25/2012  14:45pm

## 2012-03-26 NOTE — Progress Notes (Addendum)
Patient complained of chest pain that radiated down both arms at around 1145. Some contraction noted in both arms perhaps due to pain. Patient very anxious, HR 130-140's, BP elevated.  EKG obtained. Versed 2mg  given along with fentanyl bolus. Dr. Delton Coombes called and updated - orders received and MD to bedside to follow up at 1200.

## 2012-03-26 NOTE — Progress Notes (Signed)
03/26/12 0300  Pt's blood pressure 80s-low 90s/30s-40s with a MAP of 49. HR 110s-120s. Pt has had 3 500cc boluses. She c/o of pain in abd and chest associated nausea. Schorr, NP notified. Lactic acid and Procalcitonin results are elevated. New orders received for vancomycin and cefepime IV and foley insertion. Critical care MD at bedside with pt and family. New orders received for STAT EKG, ABG, CXR, labs and to transfer pt ICU. Pt transferred to 2310 with belongings, suicide sitter, and family at bedside. E-link aware.  [place] Kalan Yeley, RN

## 2012-03-26 NOTE — Procedures (Signed)
Intubation Procedure Note Laurie Sparks [id number] 1940/12/17  Procedure: Intubation Indications: Respiratory insufficiency  Procedure Details Consent: Risks of procedure as well as the alternatives and risks of each were explained to the (patient/caregiver).  Consent for procedure obtained. Time Out: Verified patient identification, verified procedure, site/side was marked, verified correct patient position, special equipment/implants available, medications/allergies/relevent history reviewed, required imaging and test results available.  Performed  Dugs: Etomidate 20mg  IV, Rocuronium 50mg  IV  DLx1 withMAC and 3; Grade 1 view, 7.5 ETT passed between cords  under direct visualization and secured at 23cm, placement confirmed with smoke in tube, positive color change on ETCO2, bilat breath sounds   Evaluation Hemodynamic Status: BP stable throughout; O2 sats: stable throughout Patient's Current Condition: stable Complications: No apparent complications Patient did tolerate procedure well. Chest X-ray ordered to verify placement.  CXR: pending.   Laurie Sparks 03/26/2012

## 2012-03-26 NOTE — Progress Notes (Signed)
Name: Laurie Sparks MRN: [id number] DOB: 06-26-1941    LOS: 2  Referring Provider:  Maryan Puls Reason for Referral:  Shock, hypoxemia  PULMONARY / CRITICAL CARE MEDICINE  HPI:  This is a 71 y/o female with known CHF and CAD and multiple abdominal surgeries in the past was admitted to [facility] on 8/14 with chest pain, nausea and vomiting.  She was found to have a UTI after admission and was started on ceftriaxone.  Later in the evening on 8/14 she developed hypotension, hypoxemia, and tachycardia with a fever.  She states that her abdominal pain is pressure like, comes and goes and is associated with nausea.  She has not aspirated this evening.  Her last bowel movement was on 8/14 early AM.  PCCM consulted for further evaluation.  Brief patient description:  71 y/o female with CHF and CAD admitted 8/14 with chest pain developed hypotension, tachycardia and worsening hypoxemic respiratory failure likely in the setting of sepsis.  DDx sepsis includes UTI vs. Cholangitis vs. Less likely SBO/abdominal process.  Events Since Admission:  Current Status: Has been unable to get CT scan because unsuccessful getting enteral access norepi changed to phenylephrine for tachycardia  Vital Signs: Temp:  [97.5 F (36.4 C)-100.9 F (38.3 C)] 97.5 F (36.4 C) (08/15 0735) Pulse Rate:  [80-129] 83  (08/15 0812) Resp:  [16-28] 18  (08/15 0812) BP: (74-198)/(35-97) 103/62 mmHg (08/15 0812) SpO2:  [92 %-100 %] 100 % (08/15 0818) FiO2 (%):  [36 %-60 %] 60 % (08/15 0818) Weight:  [44 kg (97 lb)] 44 kg (97 lb) (08/14 2055)   Physical Examination: Gen: chronically ill appearing, intubated HEENT: NCAT, PERRL, EOMi, ETT in place PULM: Insp crackles in bases bilaterally CV: Tachy, regulard, no mgr, markedly elevated JVD AB: BS absent, soft, diffusely tender but no guarding or rebound, no hsm Ext: warm, no edema, no clubbing, no cyanosis Derm: no rash or skin breakdown Neuro: sedated, moves to voice and physical  stim   Principal Problem:  *Chest pain syndrome Active Problems:  CAD (coronary artery disease)  COPD (chronic obstructive pulmonary disease)  Anxiety  CHF (congestive heart failure)  Vitamin D deficiency disease  Suicidal ideation  UTI (lower urinary tract infection)  Hypotension  Fever  Septic shock  Acute respiratory failure with hypoxia   ASSESSMENT AND PLAN  PULMONARY  Lab 03/26/12 0412 03/26/12 0305 03/26/12 0040  PHART 7.359 -- 7.363  PCO2ART 35.5 -- 39.1  PO2ART 68.0* -- 65.0*  HCO3 20.0 -- 21.5  O2SAT 93.0 78.9 93.3   Ventilator Settings: Vent Mode:  [-] PRVC FiO2 (%):  [36 %-60 %] 60 % Set Rate:  [18 bmp] 18 bmp Vt Set:  [350 mL] 350 mL PEEP:  [5 cmH20] 5 cmH20 Plateau Pressure:  [15 cmH20-17 cmH20] 15 cmH20 CXR:  Bilateral pulmonary edema, bilateral effusions ETT:  n/a  A:  Acute on chronic hypoxemic respiratory failure due to pulmonary edema, effusions; COPD on 3 L O2 at home P:   -intubated early 8/15, will liberalize Vt, wean FiO2 today -nebs prn  CARDIOVASCULAR  Lab 03/26/12 0130 03/26/12 0029 03/25/12 2200 03/25/12 1742 03/25/12 0927 03/25/12 0147  TROPONINI <0.30 <0.30 -- <0.30 <0.30 <0.30  LATICACIDVEN 2.5* -- 3.5* -- -- --  PROBNP -- -- -- -- -- --   ECG:  Sinus tach, peaked T waves, no ST wave changes Lines:  03/26/12 R IJ CVL >> 03/25/12 2D TTE LVEF 60-65%, elevated PA pressure  A: Septic shock and currently volume  overloaded based on physical exam and CXR; question mixed shock (cardiogenic?) given exam findings; CVP 8/15 am = 8 CAD but ruled out for MI, 03/25/12 TTE normal P:  -EGDT, goal CVP 8-12 -see ID -phenylephrine for shock, add vasopressin -repeat lactic acid, Pct in am   RENAL  Lab 03/26/12 0440 03/26/12 0029 03/25/12 0521 03/24/12 1948  NA 139 137 138 138  K 3.1* 3.4* -- --  CL 104 103 99 99  CO2 23 20 29  --  BUN 20 19 10 17   CREATININE 0.55 0.55 0.42* 0.70  CALCIUM 8.1* 7.6* 8.8 --  MG -- -- 1.6 --  PHOS  -- -- -- --   Intake/Output      08/14 0701 - 08/15 0700 08/15 0701 - 08/16 0700   P.O. 240    I.V. (mL/kg) 83 (1.9) 193.2 (4.4)   IV Piggyback 100 50   Total Intake(mL/kg) 423 (9.6) 243.2 (5.5)   Urine (mL/kg/hr) 1110 (1.1) 75   Total Output 1110 75   Net -687 +168.2         Foley:  03/25/12  A:  No acute issues P:   -follow UOP -follow BMET  GASTROINTESTINAL  Lab 03/26/12 0029 03/25/12 0521  AST 117* 360*  ALT 216* 425*  ALKPHOS 143* 239*  BILITOT 3.4* 3.6*  PROT 4.8* 6.0  ALBUMIN 2.5* 3.5    A:  Elevated transaminases and alk phos of uncertain etiology worrisome for cholangitis (hx chole) given fever, elevated WBC Also with absent bowel sounds, history of multiple abdominal surgeries, worrisome for SBO P:   -OG tube to suction -CT abdomen pelvis still pending; if unable to get enteral access then will perform with only IV contrast -see ID  HEMATOLOGIC  Lab 03/26/12 0440 03/26/12 0029 03/25/12 0521 03/24/12 1948 03/24/12 1940  HGB 11.2* 10.3* 12.6 13.9 12.5  HCT 35.0* 31.8* 39.2 41.0 38.9  PLT 127* 109* 179 -- 185  INR -- -- -- -- --  APTT -- -- -- -- --   A:  No acute issues P:  Monitor for bleeding  INFECTIOUS  Lab 03/26/12 0440 03/26/12 0130 03/26/12 0029 03/25/12 2200 03/25/12 0521 03/24/12 1940  WBC 22.4* -- 15.0* -- 11.6* 6.1  PROCALCITON -- 11.59 -- 9.25 -- --   Cultures: 03/25/12 blood >> 03/25/12 urine >>  Antibiotics: 8/14 zosyn (UTI, Cholangitis)>> 8/14 vanc (empiric sepsis) >>  A:  Septic shock, ddx UTI, cholangitis, bowel perf? P:   -antibiotics, cultures as above -CT abdomen pending  ENDOCRINE  Lab 03/26/12 0359 03/25/12 2145 03/25/12 2007 03/25/12 1616 03/25/12 1439  GLUCAP 130* 100* 91 151* 191*   A:  Hyperglycemia P:   -ICU hyperglycemia protocol  NEUROLOGIC  A:  Suicidality on 8/14 P:   -psych to follow up after critical illness  BEST PRACTICE / DISPOSITION Level of Care:  ICU Primary Service:  PCCM Consultants:   psych Code Status:  full Diet:  npo DVT Px:  lovenox GI Px:  ppi Skin Integrity:  normal Social / Family:  Updated son and daughter at bedside 8/15  CC time 45 minutes  Levy Pupa, MD, PhD 03/26/2012, 9:25 AM [facility] [phone] or if no answer [phone]

## 2012-03-26 NOTE — Progress Notes (Signed)
CSW reviewed pt chart, and informed unit csw of 2300 regarding referral for advanced directives and csw inability to complete assessment with pt for advanced directives due to patient care needs yesterday afternoon. Unit CSW aware of referral for advanced directives. Please follow up with unit csw for further csw needs.   Catha Gosselin, Theresia Majors  [phone] .03/26/2012 8:56am

## 2012-03-26 NOTE — Progress Notes (Signed)
OT Cancellation Note  Treatment cancelled today due to medical issues with patient which prohibited therapy--pt now intubated. OT will sign off, please re-order as appropriate.  Laurie Sparks [phone] 03/26/2012, 10:57 AM

## 2012-03-26 NOTE — Procedures (Signed)
Central Venous Catheter Insertion Procedure Note Bindu Docter [id number] 03/01/1941  Procedure: Insertion of Central Venous Catheter Indications: Assessment of intravascular volume and Drug and/or fluid administration  Procedure Details Consent: Risks of procedure as well as the alternatives and risks of each were explained to the (patient/caregiver).  Consent for procedure obtained. Time Out: Verified patient identification, verified procedure, site/side was marked, verified correct patient position, special equipment/implants available, medications/allergies/relevent history reviewed, required imaging and test results available.  Performed  Maximum sterile technique was used including antiseptics, cap, gloves, gown, hand hygiene, mask and sheet. Skin prep: Chlorhexidine; local anesthetic administered A antimicrobial bonded/coated triple lumen catheter was placed in the right internal jugular vein using the Seldinger technique. Ultrasound used for vessel identification.  Evaluation Blood flow good Complications: No apparent complications Patient did tolerate procedure well. Chest X-ray ordered to verify placement.  CXR: pending.  MCQUAID, DOUGLAS 03/26/2012, 3:17 AM

## 2012-03-26 NOTE — Progress Notes (Signed)
Subjective: 1945: Notified by RN that pt has had sudden change in status. Laurie Sparks was admitted 03/25/12 at 0150 for c/o persistent CP (r/o MI). Earlier in the afternoon pt had significantly elevated BP. Dr Isidoro Donning was notified and orders rec'd. Pt was given IV Lopressor per her order. EKG at that time was noted to show ST w/ rate of 109 and no acute changes. At approx 1500 pt noted to have rectal temp of 102.7 and significant hypotension 90's/50's by 1800. Rapid response was called and boluses initiated. RN reported pt noted w/ rigors and c/o "hurting all over" at onset of fever.  U/A sent and blood cultures obtained. Current temp 100.9. Pt remains hypotensive s/p IVF's,  and somewhat lethargic and pale per RN. Pt currently continues to c/o "hurting all over" and "freezing to death". Objective: Upon my arrival to bedside pt noted lying in bed, somewhat lethargic but responsive and pale. BP-96/45 P-112, R-18 w/ 02 sats of 93-95% on 3 L [place]. BBS somewhat diminished at the bases but otherwise CTA. HR w/ RRR w/o M/G/R. Skin currently w/d. Abd soft NT w/ BS in all quads. CXR from today w/o acute findings. First two sets of cardiac enzymes negative. U/A grossly positive (is on Rocephin) WBC w/ slight elevation today of 11.6. Blood cultures pending. Assessment/Plan: 1. Fever/Hypotension: Hypotension minimally improved w/ IV boluses. Pt somewhat more alert after transfer to SDU. Concern for early SIRS given positive U/A. Rocephin d/c'd, Vanc and Cefepime to be dosed by pharmacy. Will continue IV boluses for now and obtain serum lactic acid and procalcitonin. Discussed pt w/ Dr Toniann Fail. If no improvement in BP and or significant elevations in lactic acid and or procalcitionin will consult w/ CCM for further management.   RAI,RIPUDEEP M.D. [facility] 03/26/2012, 3:19 PM  Pager: [phone]

## 2012-03-26 NOTE — Progress Notes (Signed)
eLink Physician-Brief Progress Note Patient Name: Laurie Sparks DOB: [birth year] MRN: [id number]  Date of Service  03/26/2012   HPI/Events of Note     eICU Interventions  Discussed CT findings with Dr Madilyn Fireman who will see pt. Doubt she is a candidate for ERCP at this time   Intervention Category Intermediate Interventions: Diagnostic test evaluation  ALVA,RAKESH V. 03/26/2012, 7:09 PM

## 2012-03-26 NOTE — Progress Notes (Signed)
PT Cancellation/Discharge Note  Treatment cancelled today due to medical issues with patient which prohibited therapy (pt now intubated).  Will sign off for now.  Please reorder when appropriate.  Mikeya Tomasetti 03/26/2012, 8:42 AM

## 2012-03-26 NOTE — Progress Notes (Signed)
Aline attempted by Ladoris Gene twice on left & once on right by Gregery Na. Unable to place Arterial line. Site held until no bleeding. RN notified of unsuccessful attempts

## 2012-03-27 ENCOUNTER — Inpatient Hospital Stay (HOSPITAL_COMMUNITY): Payer: Medicare Other

## 2012-03-27 ENCOUNTER — Encounter (HOSPITAL_COMMUNITY): Payer: Self-pay | Admitting: Radiology

## 2012-03-27 LAB — GLUCOSE, CAPILLARY
Glucose-Capillary: 141 mg/dL — ABNORMAL HIGH (ref 70–99)
Glucose-Capillary: 147 mg/dL — ABNORMAL HIGH (ref 70–99)
Glucose-Capillary: 129 mg/dL — ABNORMAL HIGH (ref 70–99)
Glucose-Capillary: 131 mg/dL — ABNORMAL HIGH (ref 70–99)

## 2012-03-27 LAB — BASIC METABOLIC PANEL
BUN: 24 mg/dL — ABNORMAL HIGH (ref 6–23)
CO2: 19 mEq/L (ref 19–32)
Calcium: 7.4 mg/dL — ABNORMAL LOW (ref 8.4–10.5)
Creatinine, Ser: 0.68 mg/dL (ref 0.50–1.10)
GFR calc non Af Amer: 86 mL/min — ABNORMAL LOW (ref >90)
Glucose, Bld: 174 mg/dL — ABNORMAL HIGH (ref 70–99)
Sodium: 130 mEq/L — ABNORMAL LOW (ref 135–145)

## 2012-03-27 LAB — HEPATIC FUNCTION PANEL
ALT: 139 U/L — ABNORMAL HIGH (ref 0–35)
Bilirubin, Direct: 3.8 mg/dL — ABNORMAL HIGH (ref 0.0–0.3)
Indirect Bilirubin: 0.7 mg/dL (ref 0.3–0.9)
Total Protein: 4.6 g/dL — ABNORMAL LOW (ref 6.0–8.3)

## 2012-03-27 LAB — CBC
MCH: 30.9 pg (ref 26.0–34.0)
MCHC: 32.3 g/dL (ref 30.0–36.0)
MCV: 95.8 fL (ref 78.0–100.0)
Platelets: 44 10*3/uL — ABNORMAL LOW (ref 150–400)
RBC: 3.33 MIL/uL — ABNORMAL LOW (ref 3.87–5.11)
RDW: 14.3 % (ref 11.5–15.5)

## 2012-03-27 LAB — PROCALCITONIN: Procalcitonin: 39.94 ng/mL

## 2012-03-27 LAB — LACTIC ACID, PLASMA: Lactic Acid, Venous: 2.5 mmol/L — ABNORMAL HIGH (ref 0.5–2.2)

## 2012-03-27 LAB — TSH: TSH: 1.487 u[IU]/mL (ref 0.350–4.500)

## 2012-03-27 LAB — PROTIME-INR: INR: 1.22 (ref 0.00–1.49)

## 2012-03-27 MED ORDER — NOREPINEPHRINE BITARTRATE 1 MG/ML IJ SOLN
2.0000 ug/min | INTRAVENOUS | Status: DC
  Administered 2012-03-27: 8 ug/min via INTRAVENOUS
  Filled 2012-03-27: qty 4

## 2012-03-27 MED ORDER — IOHEXOL 300 MG/ML  SOLN
100.0000 mL | Freq: Once | INTRAMUSCULAR | Status: AC | PRN
  Administered 2012-03-27: 60 mL via INTRAVENOUS

## 2012-03-27 MED ORDER — FENTANYL CITRATE 0.05 MG/ML IJ SOLN
INTRAMUSCULAR | Status: AC
  Filled 2012-03-27: qty 4

## 2012-03-27 MED ORDER — HYDROCORTISONE SOD SUCCINATE 100 MG IJ SOLR
50.0000 mg | Freq: Four times a day (QID) | INTRAMUSCULAR | Status: DC
  Administered 2012-03-27 – 2012-03-30 (×11): 50 mg via INTRAVENOUS
  Filled 2012-03-27 (×17): qty 1

## 2012-03-27 MED ORDER — VANCOMYCIN HCL IN DEXTROSE 1-5 GM/200ML-% IV SOLN
1000.0000 mg | INTRAVENOUS | Status: DC
  Administered 2012-03-27 – 2012-03-31 (×4): 1000 mg via INTRAVENOUS
  Filled 2012-03-27 (×4): qty 200

## 2012-03-27 MED ORDER — MIDAZOLAM HCL 2 MG/2ML IJ SOLN
INTRAMUSCULAR | Status: AC
  Filled 2012-03-27: qty 4

## 2012-03-27 MED ORDER — MIDAZOLAM HCL 5 MG/5ML IJ SOLN
INTRAMUSCULAR | Status: AC | PRN
  Administered 2012-03-27: 2 mg via INTRAVENOUS

## 2012-03-27 NOTE — Progress Notes (Signed)
Name: Elisse Pennick MRN: [id number] DOB: January 18, 1941    LOS: 3  Referring Provider:  Maryan Puls Reason for Referral:  Shock, hypoxemia  PULMONARY / CRITICAL CARE MEDICINE  HPI:  This is a 71 y/o female with known CHF and CAD and multiple abdominal surgeries in the past was admitted to [facility] on 8/14 with chest pain, nausea and vomiting.  She was found to have a UTI after admission and was started on ceftriaxone.  Later in the evening on 8/14 she developed hypotension, hypoxemia, and tachycardia with a fever.  She states that her abdominal pain is pressure like, comes and goes and is associated with nausea.  She has not aspirated this evening.  Her last bowel movement was on 8/14 early AM.  PCCM consulted for further evaluation.  Brief patient description:  71 y/o female with CHF and CAD admitted 8/14 with chest pain developed hypotension, tachycardia and worsening hypoxemic respiratory failure likely in the setting of sepsis likely due to cholangitis vs. Less likely SBO/abdominal process.  Events Since Admission: 8/15 >> s/p CT scan abd, CBD obstruction with probable mass.   Current Status: Wakes to voice Remains on norepi + phenylephrine CT results as above   Vital Signs: Temp:  [98 F (36.7 C)-103.6 F (39.8 C)] 98.4 F (36.9 C) (08/16 0723) Pulse Rate:  [62-137] 77  (08/16 0808) Resp:  [14-30] 18  (08/16 0808) BP: (68-145)/(34-88) 119/69 mmHg (08/16 0808) SpO2:  [86 %-100 %] 100 % (08/16 0811) FiO2 (%):  [39.7 %-40.4 %] 40 % (08/16 0811) Weight:  [46.6 kg (102 lb 11.8 oz)] 46.6 kg (102 lb 11.8 oz) (08/16 0400)  Intake/Output Summary (Last 24 hours) at 03/27/12 4098 Last data filed at 03/27/12 0700  Gross per 24 hour  Intake 5284.59 ml  Output   1087 ml  Net 4197.59 ml    Physical Examination: Gen: chronically ill appearing, intubated HEENT: NCAT, PERRL, EOMi, ETT in place PULM: Insp crackles in bases bilaterally CV: Tachy, regulard, no mgr, markedly elevated JVD AB: BS  absent, soft, diffusely tender but no guarding or rebound, no hsm Ext: warm, no edema, no clubbing, no cyanosis Derm: no rash or skin breakdown Neuro: sedated, moves to voice and physical stim   Principal Problem:  *Chest pain syndrome Active Problems:  CAD (coronary artery disease)  COPD (chronic obstructive pulmonary disease)  Anxiety  CHF (congestive heart failure)  Vitamin D deficiency disease  Suicidal ideation  UTI (lower urinary tract infection)  Hypotension  Fever  Septic shock  Acute respiratory failure with hypoxia   ASSESSMENT AND PLAN  PULMONARY  Lab 03/26/12 0412 03/26/12 0305 03/26/12 0040  PHART 7.359 -- 7.363  PCO2ART 35.5 -- 39.1  PO2ART 68.0* -- 65.0*  HCO3 20.0 -- 21.5  O2SAT 93.0 78.9 93.3   Ventilator Settings: Vent Mode:  [-] PRVC FiO2 (%):  [39.7 %-40.4 %] 40 % Set Rate:  [14 bmp] 14 bmp Vt Set:  [450 mL] 450 mL PEEP:  [5 cmH20] 5 cmH20 Plateau Pressure:  [17 cmH20-22 cmH20] 21 cmH20 CXR:  Bilateral effusions > pulm edema ETT:  n/a  A:  Acute on chronic hypoxemic respiratory failure due to pulmonary edema, effusions; COPD on 3 L O2 at home P:   -continue current PRVC settings -nebs prn  CARDIOVASCULAR  Lab 03/27/12 0400 03/26/12 0130 03/26/12 0029 03/25/12 2200 03/25/12 1742 03/25/12 0927 03/25/12 0147  TROPONINI -- <0.30 <0.30 -- <0.30 <0.30 <0.30  LATICACIDVEN 2.5* 2.5* -- 3.5* -- -- --  PROBNP -- -- -- -- -- -- --  ECG:  Sinus tach, peaked T waves, no ST wave changes Lines:  03/26/12 R IJ CVL >> 03/25/12 2D TTE LVEF 60-65%, elevated PA pressure  A: Septic shock and currently volume overloaded based on physical exam and CXR; question mixed shock (cardiogenic?) given exam findings; CVP 8/15 am = 8 CAD but ruled out for MI, 03/25/12 TTE normal Lactate 2.5 >> 2.5 on 8/16 P:  -EGDT, goal CVP 8-12 -see ID section -phenylephrine, norepi for shock, add vasopressin   RENAL  Lab 03/27/12 0400 03/26/12 0440 03/26/12 0029  03/25/12 0521 03/24/12 1948  NA 130* 139 137 138 138  K 3.8 3.1* -- -- --  CL 101 104 103 99 99  CO2 19 23 20 29  --  BUN 24* 20 19 10 17   CREATININE 0.68 0.55 0.55 0.42* 0.70  CALCIUM 7.4* 8.1* 7.6* 8.8 --  MG -- -- -- 1.6 --  PHOS -- -- -- -- --   Intake/Output      08/15 0701 - 08/16 0700 08/16 0701 - 08/17 0700   P.O.     I.V. (mL/kg) 5112.8 (109.7)    NG/GT 90    IV Piggyback 325    Total Intake(mL/kg) 5527.8 (118.6)    Urine (mL/kg/hr) 1160 (1)    Stool 2    Total Output 1162    Net +4365.8          Foley:  03/25/12  A:  No acute issues P:   -follow UOP and BMP  GASTROINTESTINAL  Lab 03/27/12 0400 03/26/12 0029 03/25/12 0521  AST 76* 117* 360*  ALT 139* 216* 425*  ALKPHOS 156* 143* 239*  BILITOT 4.5* 3.4* 3.6*  PROT 4.6* 4.8* 6.0  ALBUMIN 2.0* 2.5* 3.5    A:  Elevated transaminases and alk phos, improving,due to cholangitis (hx chole), CBD obstruction Also with absent bowel sounds, history of multiple abdominal surgeries, ileus on CT scan; note huge hiatal hernia P:   -OG tube to suction -definitive rx for mass at ampula would appear to be stenting or tube drainage; GI Dr Madilyn Fireman consulted 8/15. I will ask IR to evaluate the case also >> spoke with Dr Lowella Dandy who will evaluate her today.  -see ID  HEMATOLOGIC  Lab 03/27/12 0400 03/26/12 1530 03/26/12 1215 03/26/12 0440 03/26/12 0029 03/25/12 0521  HGB 10.3* 10.3* -- 11.2* 10.3* 12.6  HCT 31.9* 32.6* -- 35.0* 31.8* 39.2  PLT 44* 64* -- 127* 109* 179  INR 1.22 -- 1.47 -- -- --  APTT -- -- 37 -- -- --   A:  No acute issues P:  Monitor for bleeding  INFECTIOUS  Lab 03/27/12 0400 03/26/12 1530 03/26/12 0440 03/26/12 0130 03/26/12 0029 03/25/12 2200 03/25/12 0521  WBC 26.3* 4.4 22.4* -- 15.0* -- 11.6*  PROCALCITON 39.94 -- -- 11.59 -- 9.25 --   Cultures: 03/25/12 blood >> GNR's + GPC's 03/25/12 urine >> NGTD >>   Antibiotics: 8/14 zosyn (UTI, Cholangitis)>> 8/14 vanc (empiric sepsis) >>  A:  Septic  shock, ddx UTI, cholangitis, bowel perf? P:   -antibiotics, cultures as above -Will need to drain biliary system if we are to continue to be aggressive   ENDOCRINE  Lab 03/27/12 0722 03/27/12 0411 03/26/12 2343 03/26/12 2028 03/26/12 1618  GLUCAP 147* 141* 128* 122* 99   A:  Hyperglycemia P:   -ICU hyperglycemia protocol  NEUROLOGIC  A:  Suicidality on 8/14 P:   -psych to follow up after critical illness  BEST PRACTICE / DISPOSITION Level of  Care:  ICU Primary Service:  PCCM Consultants:  psych Code Status:  full Diet:  npo DVT Px:  lovenox GI Px:  ppi Skin Integrity:  normal Social / Family:  Updated son and daughter at bedside 8/15  CC time 45 minutes  Levy Pupa, MD, PhD 03/27/2012, 8:19 AM [facility] [phone] or if no answer [phone]

## 2012-03-27 NOTE — Progress Notes (Signed)
ANTIBIOTIC CONSULT NOTE - INITIAL  Pharmacy Consult for vancomycin and Zosyn  Indication: rule out sepsis/UTI  Allergies  Allergen Reactions  . Ambien (Zolpidem Tartrate) Other (See Comments)    Hallucinate   . Other     Base metal and something else    Patient Measurements: Height: 5\' 1"  (154.9 cm) Weight: 102 lb 11.8 oz (46.6 kg) IBW/kg (Calculated) : 47.8   Vital Signs: Temp: 97.9 F (36.6 C) (08/16 1045) Temp src: Oral (08/16 1015) BP: 149/75 mmHg (08/16 1045) Pulse Rate: 69  (08/16 1045) Intake/Output from previous day: 08/15 0701 - 08/16 0700 In: 5527.8 [I.V.:5112.8; NG/GT:90; IV Piggyback:325] Out: 1162 [Urine:1160; Stool:2] Intake/Output from this shift: Total I/O In: 658.9 [I.V.:413.4; Blood:190.5; NG/GT:30; IV Piggyback:25] Out: 30 [Urine:30]  Labs:  [facility] 03/27/12 0400 03/26/12 1530 03/26/12 0440 03/26/12 0029  WBC 26.3* 4.4 22.4* --  HGB 10.3* 10.3* 11.2* --  PLT 44* 64* 127* --  LABCREA -- -- -- --  CREATININE 0.68 -- 0.55 0.55   Estimated Creatinine Clearance: 47.4 ml/min (by C-G formula based on Cr of 0.68). No results found for this basename: VANCOTROUGH:2,VANCOPEAK:2,VANCORANDOM:2,GENTTROUGH:2,GENTPEAK:2,GENTRANDOM:2,TOBRATROUGH:2,TOBRAPEAK:2,TOBRARND:2,AMIKACINPEAK:2,AMIKACINTROU:2,AMIKACIN:2, in the last 72 hours   Microbiology: Recent Results (from the past 720 hour(s))  URINE CULTURE     Status: Normal (Preliminary result)   Collection Time   03/25/12  3:28 PM      Component Value Range Status Comment   Specimen Description URINE, RANDOM   Final    Special Requests NONE   Final    Culture  Setup Time 03/25/2012 15:54   Final    Colony Count 65,000 COLONIES/ML   Final    Culture     Final    Value: STAPHYLOCOCCUS SPECIES (COAGULASE NEGATIVE)     Note: RIFAMPIN AND GENTAMICIN SHOULD NOT BE USED AS SINGLE DRUGS FOR TREATMENT OF STAPH INFECTIONS.   Report Status PENDING   Incomplete   CULTURE, BLOOD (ROUTINE X 2)     Status: Normal  (Preliminary result)   Collection Time   03/25/12  3:55 PM      Component Value Range Status Comment   Specimen Description BLOOD LEFT HAND   Final    Special Requests BOTTLES DRAWN AEROBIC ONLY 5CC   Final    Culture  Setup Time 03/25/2012 20:33   Final    Culture     Final    Value: GRAM NEGATIVE RODS     GRAM POSITIVE COCCI IN CHAINS     Note: Gram Stain Report Called to,Read Back By and Verified With: CARRIE H @1021  03/26/12 BY KRAWS   Report Status PENDING   Incomplete   CULTURE, BLOOD (ROUTINE X 2)     Status: Normal (Preliminary result)   Collection Time   03/25/12  4:00 PM      Component Value Range Status Comment   Specimen Description BLOOD LEFT ARM   Final    Special Requests BOTTLES DRAWN AEROBIC ONLY 5CC   Final    Culture  Setup Time 03/25/2012 20:33   Final    Culture     Final    Value: GRAM NEGATIVE RODS     GRAM POSITIVE COCCI IN CHAINS     Note: Gram Stain Report Called to,Read Back By and Verified With: CARRIE H @1021  03/26/12 BY KRAWS   Report Status PENDING   Incomplete   MRSA PCR SCREENING     Status: Normal   Collection Time   03/25/12  9:10 PM  Component Value Range Status Comment   MRSA by PCR NEGATIVE  NEGATIVE Final     Medical History: Past Medical History  Diagnosis Date  . Coronary artery disease   . Myocardial infarct   . CHF (congestive heart failure)     Medications:  Prescriptions prior to admission  Medication Sig Dispense Refill  . aspirin EC 81 MG tablet Take 81 mg by mouth daily.      . carvedilol (COREG) 12.5 MG tablet Take 12.5 mg by mouth 2 (two) times daily with a meal.      . HYDROcodone-acetaminophen (NORCO) 7.5-325 MG per tablet Take 1 tablet by mouth 3 (three) times daily.      . nitroGLYCERIN (NITROSTAT) 0.4 MG SL tablet Place 0.4 mg under the tongue every 5 (five) minutes as needed. For chest pain       Assessment: 71 yo female who developed hypotension, tachycardia, and worsening hypoxemic respiratory failure on the  first day of admission here with probable UTI, sepsis, and abdominal process. Vancomycin and Zosyn for empiric coverage; antibiotic day #3. No antibiotic allergies noted. Urine Cx 8/14 Coag neg staph; Blood x2 8/14 GNR, GPC in chains. Afebrile, WBC elevated at 26.3, renal function stable, CrCl ~45-50 ml/min.   Cefepime 8/14 (1 day) Zosyn 8/15 >> Vancomycin 8/14 >>    Goal of Therapy:  Vancomycin trough level 15-20 mcg/ml  Plan:  Increase vancomycin to 1000 mg IV q24 hours Continue Zosyn 3.375 g IV q8h Follow up culture results Follow up vancomycin trough at steady state if clinically indicated   Maudry Mayhew, PharmD Clinical Pharmacist Pgr [phone] 03/27/2012,11:14 AM

## 2012-03-27 NOTE — Consult Note (Signed)
Referring Provider: Dr. Vassie Loll Primary Care Physician:  Warrick Parisian, MD Primary Gastroenterologist:  Gentry Fitz  Reason for Consultation:  Jaundice, RUQ pain, elevated LFTs  HPI: Laurie Sparks is a 71 y.o. female who was admitted for chest pain and development of septic shock with CT showing biliary obstruction and a periampullary mass. LFTs revealed TB 3.6, AST 360, ALT 425. Patient sent for [facility] by radiology today and an external drain placed. Patient on multiple pressors and intubated.  Past Medical History  Diagnosis Date  . Coronary artery disease   . Myocardial infarct   . CHF (congestive heart failure)     Past Surgical History  Procedure Date  . Cholecystectomy   . Abdominal hysterectomy   . Ankle surgery     Prior to Admission medications   Medication Sig Start Date End Date Taking? Authorizing Provider  aspirin EC 81 MG tablet Take 81 mg by mouth daily.   Yes Historical Provider, MD  carvedilol (COREG) 12.5 MG tablet Take 12.5 mg by mouth 2 (two) times daily with a meal.   Yes Historical Provider, MD  HYDROcodone-acetaminophen (NORCO) 7.5-325 MG per tablet Take 1 tablet by mouth 3 (three) times daily.   Yes Historical Provider, MD  nitroGLYCERIN (NITROSTAT) 0.4 MG SL tablet Place 0.4 mg under the tongue every 5 (five) minutes as needed. For chest pain   Yes Historical Provider, MD    Scheduled Meds:   . albuterol  4 puff Inhalation Q4H  . antiseptic oral rinse  15 mL Mouth Rinse BID  . antiseptic oral rinse  15 mL Mouth Rinse QID  . aspirin EC  81 mg Oral Daily  . chlorhexidine  15 mL Mouth Rinse BID  . docusate sodium  100 mg Oral BID  . hydrocortisone sod succinate (SOLU-CORTEF) injection  50 mg Intravenous Q6H  . insulin aspart  0-3 Units Subcutaneous Q4H  . insulin aspart  0-9 Units Subcutaneous TID WC  . LORazepam  1 mg Intravenous Once  . midazolam      . nitroGLYCERIN  0.1 mg Transdermal Daily  . pantoprazole sodium  40 mg Oral BID AC  .  piperacillin-tazobactam (ZOSYN)  IV  3.375 g Intravenous Q8H  . potassium chloride      . sodium chloride  1,000 mL Intravenous Once  . sodium chloride  3 mL Intravenous Q12H  . vancomycin  1,000 mg Intravenous Q24H  . DISCONTD: pantoprazole  40 mg Oral BID AC  . DISCONTD: vancomycin  750 mg Intravenous Q24H   Continuous Infusions:   . sodium chloride 50 mL/hr at 03/27/12 0400  . dextrose    . fentaNYL infusion INTRAVENOUS 50 mcg/hr (03/27/12 0800)  . norepinephrine (LEVOPHED) Adult infusion 7 mcg/min (03/27/12 1100)  . phenylephrine (NEO-SYNEPHRINE) Adult infusion 130 mcg/min (03/27/12 0800)  . vasopressin (PITRESSIN) infusion - *FOR SHOCK* 0.03 Units/min (03/27/12 1012)  . DISCONTD: norepinephrine (LEVOPHED) Adult infusion    . DISCONTD: phenylephrine (NEO-SYNEPHRINE) Adult infusion 180 mcg/min (03/26/12 1600)   PRN Meds:.sodium chloride, acetaminophen, acetaminophen, albuterol, albuterol, dextrose, DOBUTamine, fentaNYL, fentaNYL, iohexol, ipratropium, midazolam, midazolam, morphine injection, nitroGLYCERIN, norepinephrine (LEVOPHED) Adult infusion, ondansetron, ondansetron (ZOFRAN) IV, sodium chloride, sodium chloride  Allergies as of 03/24/2012 - Review Complete 03/24/2012  Allergen Reaction Noted  . Ambien (zolpidem tartrate) Other (See Comments) 03/24/2012  . Other  03/24/2012    No family history on file.  History   Social History  . Marital Status: Single    Spouse Name: N/A    Number of Children: N/A  .  Years of Education: N/A   Occupational History  . Not on file.   Social History Main Topics  . Smoking status: Never Smoker   . Smokeless tobacco: Not on file  . Alcohol Use: No  . Drug Use: No  . Sexually Active:    Other Topics Concern  . Not on file   Social History Narrative  . No narrative on file    Review of Systems: Unable to obtain  Physical Exam: Vital signs: Filed Vitals:   03/27/12 1320  BP: 84/45  Pulse: 115  Temp: 97.9  Resp: 14    Last BM Date: 03/26/12 General:   Intubated, elderly, frail, thin  Lungs:  Coarse breath sounds  Heart:  Regular rate and rhythm Abdomen: soft, nondistended, positive bowel sounds  Rectal:  Deferred  GI:  Lab Results:  Basename 03/27/12 0400 03/26/12 1530 03/26/12 0440  WBC 26.3* 4.4 22.4*  HGB 10.3* 10.3* 11.2*  HCT 31.9* 32.6* 35.0*  PLT 44* 64* 127*   BMET  Basename 03/27/12 0400 03/26/12 0440 03/26/12 0029  NA 130* 139 137  K 3.8 3.1* 3.4*  CL 101 104 103  CO2 19 23 20   GLUCOSE 174* 142* 109*  BUN 24* 20 19  CREATININE 0.68 0.55 0.55  CALCIUM 7.4* 8.1* 7.6*   LFT  Basename 03/27/12 0400  PROT 4.6*  ALBUMIN 2.0*  AST 76*  ALT 139*  ALKPHOS 156*  BILITOT 4.5*  BILIDIR 3.8*  IBILI 0.7   PT/INR  Basename 03/27/12 0400 03/26/12 1215  LABPROT 15.7* 18.1*  INR 1.22 1.47     Studies/Results: Ct Abdomen Pelvis W Contrast  03/26/2012  *RADIOLOGY REPORT*  Clinical Data: Abdominal pain, septic shock, nausea/vomiting  CT ABDOMEN AND PELVIS WITH CONTRAST  Technique:  Multidetector CT imaging of the abdomen and pelvis was performed following the standard protocol during bolus administration of intravenous contrast.  Contrast: 80mL OMNIPAQUE IOHEXOL 300 MG/ML  SOLN  Comparison: None.  Findings: Very large hiatal hernia, incompletely visualized, containing stomach to the right of midline (series 2/image 7), multiple loops of opacified and nonopacified bowel, and pancreas centrally (series 2/image 6).  Liver is notable for moderate to severe central intrahepatic ductal dilatation.  Common bile duct is markedly dilated with a 1.5 x 1.0 cm abnormal soft tissue lesion centrally (series 2/image 22) and a 2.7 x 1.9 cm obstructing mass distally near the ampulla in the left abdomen (series 2/image 15).  Status post cholecystectomy.  Spleen and adrenal glands are unremarkable.  5 mm nonobstructing left upper pole calculus (series 2/image 11). Right kidney is unremarkable.  No  hydronephrosis.  Enteric tube terminates in the proximal duodenum in the left abdomen (series 2/image 23).  Contrast passes into the colon, indicating that the large hernia is nonobstructive.  Colonic diverticulosis, without associated inflammatory changes.  Atherosclerotic calcifications of the abdominal aorta and branch vessels.  Small volume abdominopelvic ascites.  Status post hysterectomy.  No adnexal masses.  Bladder is notable for nondependent gas and an indwelling Foley catheter.  Prior ORIF of the proximal left femur.  Degenerative changes of the visualized thoracolumbar spine.  Mild superior endplate compression deformities at L2 and L5.  IMPRESSION:  Distorted anatomy with very large hiatal hernia containing stomach, multiple loops of bowel, and pancreas, incompletely visualized.  No evidence of bowel obstruction.  Marked dilatation of the common bile duct with at least two intraductal lesions, as described above, including a 2.7 x 1.9 cm obstructing mass near the ampulla. Moderate to  severe intrahepatic ductal dilatation.  Additional ancillary findings as above.  Original Report Authenticated By: Charline Bills, M.D.   Dg Chest Port 1 View  03/26/2012  *RADIOLOGY REPORT*  Clinical Data: Endotracheal tube placement.  Central venous catheter placement.  PORTABLE CHEST - 1 VIEW  Comparison: 03/26/2012.  Findings: Endotracheal tube is 15 mm from the carina.  New right IJ central line is present with its tip one vertebral body below the carina, in the mid-to-lower SVC.  Large bilateral pleural effusions.  Cardiopericardial silhouette mostly obscured by effusions.  Basilar atelectasis.  Pulmonary vascular congestion. Bobby pin projects over the right upper chest.  IMPRESSION:  1.  New endotracheal tube is 15 mm from the carina. 2.  Uncomplicated right IJ central line placement with the tip in the mid SVC. 3.  Bilateral pleural effusions, left greater than right with associated compressive atelectasis.   Probable cardiomegaly with portions of the cardiopericardial silhouette obscured  Original Report Authenticated By: Andreas Newport, M.D.   Dg Chest Port 1 View  03/26/2012  *RADIOLOGY REPORT*  Clinical Data: Chest pressure.  PORTABLE CHEST - 1 VIEW  Comparison: 03/24/2012.  Findings: The cardiopericardial size silhouette is obscured. Surgical clip is present in the lower chest. Bilateral left greater than right pleural effusions are present, increased from prior exam.  Additionally, there is pulmonary vascular congestion and bilateral lower lobe airspace disease, most compatible with pulmonary edema in the interval since the prior exam.  IMPRESSION: Findings compatible with moderate CHF superimposed on chronic changes.  Bilateral left greater than right pleural effusions.  Original Report Authenticated By: Andreas Newport, M.D.    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 S/P PTC and external drainage. Will need biliary brushings by radiology when possible. Unlikely to benefit from an ERCP with distal mass and obstructed duct (since external drain already placed) but may need examination of ampulla with side-viewing scope if pt stabilizes and biliary brushings not helpful. Continue broad-spectrum antibiotics. Dr. Evette Cristal to see this weekend.    LOS: 3 days   Francille Wittmann C.  03/27/2012, 1:35 PM

## 2012-03-27 NOTE — H&P (Signed)
Agree with above.  Platelet count has decreased over past 24 hrs, pt will require transfusion of 1 unit of plts during the procedure to decrease the bleeding risk.  Will proceed with placement of a Right approach PBD which should adequately drain both right and left systems.  If needed, a separate left drain may be placed.  Signed,  Sterling Big, MD Vascular & Interventional Radiologist [facility]

## 2012-03-27 NOTE — Progress Notes (Signed)
INITIAL ADULT NUTRITION ASSESSMENT Date: 03/27/2012   Time: 12:37 PM Reason for Assessment: vent  ASSESSMENT: Female 71 y.o.  Dx: Chest pain syndrome  Hx:  Past Medical History  Diagnosis Date  . Coronary artery disease   . Myocardial infarct   . CHF (congestive heart failure)    Past Surgical History  Procedure Date  . Cholecystectomy   . Abdominal hysterectomy   . Ankle surgery     Related Meds:  Scheduled Meds:   . albuterol  4 puff Inhalation Q4H  . antiseptic oral rinse  15 mL Mouth Rinse BID  . antiseptic oral rinse  15 mL Mouth Rinse QID  . aspirin EC  81 mg Oral Daily  . chlorhexidine  15 mL Mouth Rinse BID  . docusate sodium  100 mg Oral BID  . fentaNYL      . hydrocortisone sod succinate (SOLU-CORTEF) injection  50 mg Intravenous Q6H  . insulin aspart  0-3 Units Subcutaneous Q4H  . insulin aspart  0-9 Units Subcutaneous TID WC  . LORazepam  1 mg Intravenous Once  . midazolam      . nitroGLYCERIN  0.1 mg Transdermal Daily  . pantoprazole sodium  40 mg Oral BID AC  . piperacillin-tazobactam (ZOSYN)  IV  3.375 g Intravenous Q8H  . potassium chloride      . sodium chloride  1,000 mL Intravenous Once  . sodium chloride  3 mL Intravenous Q12H  . vancomycin  1,000 mg Intravenous Q24H  . DISCONTD: pantoprazole  40 mg Oral BID AC  . DISCONTD: vancomycin  750 mg Intravenous Q24H   Continuous Infusions:   . sodium chloride 50 mL/hr at 03/27/12 0400  . dextrose    . fentaNYL infusion INTRAVENOUS 50 mcg/hr (03/27/12 0800)  . norepinephrine (LEVOPHED) Adult infusion 7 mcg/min (03/27/12 1100)  . phenylephrine (NEO-SYNEPHRINE) Adult infusion 130 mcg/min (03/27/12 0800)  . vasopressin (PITRESSIN) infusion - *FOR SHOCK* 0.03 Units/min (03/27/12 1012)  . DISCONTD: norepinephrine (LEVOPHED) Adult infusion    . DISCONTD: phenylephrine (NEO-SYNEPHRINE) Adult infusion 180 mcg/min (03/26/12 1600)   PRN Meds:.sodium chloride, acetaminophen, acetaminophen, albuterol,  albuterol, dextrose, DOBUTamine, fentaNYL, fentaNYL, iohexol, ipratropium, midazolam, midazolam, morphine injection, nitroGLYCERIN, norepinephrine (LEVOPHED) Adult infusion, ondansetron, ondansetron (ZOFRAN) IV, sodium chloride, sodium chloride   Ht: 5\' 1"  (154.9 cm)  Wt: 102 lb 11.8 oz (46.6 kg)  Ideal Wt: 47.7 kg % Ideal Wt: 97%  Usual Wt: unknown  Body mass index is 19.41 kg/(m^2).  Food/Nutrition Related Hx: unable to assess  Labs:  CMP     Component Value Date/Time   NA 130* 03/27/2012 0400   K 3.8 03/27/2012 0400   CL 101 03/27/2012 0400   CO2 19 03/27/2012 0400   GLUCOSE 174* 03/27/2012 0400   BUN 24* 03/27/2012 0400   CREATININE 0.68 03/27/2012 0400   CALCIUM 7.4* 03/27/2012 0400   PROT 4.6* 03/27/2012 0400   ALBUMIN 2.0* 03/27/2012 0400   AST 76* 03/27/2012 0400   ALT 139* 03/27/2012 0400   ALKPHOS 156* 03/27/2012 0400   BILITOT 4.5* 03/27/2012 0400   GFRNONAA 86* 03/27/2012 0400   GFRAA >90 03/27/2012 0400   CBG (last 3)   Basename 03/27/12 0722 03/27/12 0411 03/26/12 2343  GLUCAP 147* 141* 128*    Intake: NPO Output:   Intake/Output Summary (Last 24 hours) at 03/27/12 1502 Last data filed at 03/27/12 1400  Gross per 24 hour  Intake 4792.83 ml  Output    541 ml  Net 4251.83 ml  Last BM date (8/15)  Diet Order: NPO  Supplements/Tube Feeding: none at this time  IVF:    sodium chloride Last Rate: 50 mL/hr at 03/27/12 0400  dextrose   fentaNYL infusion INTRAVENOUS Last Rate: 50 mcg/hr (03/27/12 0800)  norepinephrine (LEVOPHED) Adult infusion Last Rate: 7 mcg/min (03/27/12 1100)  phenylephrine (NEO-SYNEPHRINE) Adult infusion Last Rate: 130 mcg/min (03/27/12 0800)  vasopressin (PITRESSIN) infusion - *FOR SHOCK* Last Rate: 0.03 Units/min (03/27/12 1012)  DISCONTD: norepinephrine (LEVOPHED) Adult infusion   DISCONTD: phenylephrine (NEO-SYNEPHRINE) Adult infusion Last Rate: 180 mcg/min (03/26/12 1600)    Estimated Nutritional Needs:   Kcal:  1030-1140 Protein: 56-65g Fluid: ~1.4 L/day  Pt admitted with jaundice, RUQ pain, elevated LFTs.  After admission developed respiratory insufficiency and was intubated. Pt on full vent support, with OGT which terminates in the duodenum per imaging report (8/15) Pt recently returned from IR for percutaneous trans hepatic cholangiogram with biliary drain placement.  Per GI note, pt with biliary obstruction concerning for cholangiocarcinoma vs pancreatic cancer with septic shock likely due to cholangitis.    MV: 6.4 L/min Temp: 37.7 C  No family at bedside.  Pt is thin, ill-appearing. Nutrition hx largely unknown at this time, recommend slow initiation and advancement if enteral nutrition warranted by MD. Pt at moderate risk for refeeding syndrome. RD to monitor for information r/t nutrition hx and changes in status.  NUTRITION DIAGNOSIS: -Inadequate oral intake (NI-2.1).  Status: Ongoing  RELATED TO: mechanical ventilation  AS EVIDENCE BY: pt NPO  MONITORING/EVALUATION(Goals): 1.  Enteral nutrition; initiation with tolerance if pt to remain intubated >48 hrs.  Pt to meet >/= 90% of estimated needs while intubated.  EDUCATION NEEDS: -Education not appropriate at this time  INTERVENTION: 1.  Enteral nutrition; if warranted by MD, recommend initiation of Vital 1.2 @ 10 mL/hr.  Advance by 10 mL q 12 hrs to 35 mL/hr goal to provide 1008 kcal, 54g protein, 583 mL free water.  Dietitian #: [phone]  DOCUMENTATION CODES Per approved criteria  -Not Applicable    Loyce Dys [facility] 03/27/2012, 12:37 PM

## 2012-03-27 NOTE — H&P (Signed)
Laurie Sparks is an 71 y.o. female.   Chief Complaint: N/V/abd pain/ chest pain - admitted to hospital 8/13 Developed hypotension; hypoxemia; fever; tachy CT reveals common bile obstruction; cholangitis - sepsis Pt now on vent Scheduled now for percutaneous trans hepatic cholangiogram with biliary drain placement Platelets 44 today - hanging 1 unit now HPI: HTN; CAD; CHF; biliary obstruction - sepsis  Past Medical History  Diagnosis Date  . Coronary artery disease   . Myocardial infarct   . CHF (congestive heart failure)     Past Surgical History  Procedure Date  . Cholecystectomy   . Abdominal hysterectomy   . Ankle surgery     No family history on file. Social History:  reports that she has never smoked. She does not have any smokeless tobacco history on file. She reports that she does not drink alcohol or use illicit drugs.  Allergies:  Allergies  Allergen Reactions  . Ambien (Zolpidem Tartrate) Other (See Comments)    Hallucinate   . Other     Base metal and something else    Medications Prior to Admission  Medication Sig Dispense Refill  . aspirin EC 81 MG tablet Take 81 mg by mouth daily.      . carvedilol (COREG) 12.5 MG tablet Take 12.5 mg by mouth 2 (two) times daily with a meal.      . HYDROcodone-acetaminophen (NORCO) 7.5-325 MG per tablet Take 1 tablet by mouth 3 (three) times daily.      . nitroGLYCERIN (NITROSTAT) 0.4 MG SL tablet Place 0.4 mg under the tongue every 5 (five) minutes as needed. For chest pain        Results for orders placed during the hospital encounter of 03/24/12 (from the past 48 hour(s))  GLUCOSE, CAPILLARY     Status: Abnormal   Collection Time   03/25/12 11:32 AM      Component Value Range Comment   Glucose-Capillary 137 (*) 70 - 99 mg/dL   GLUCOSE, CAPILLARY     Status: Abnormal   Collection Time   03/25/12  2:39 PM      Component Value Range Comment   Glucose-Capillary 191 (*) 70 - 99 mg/dL   URINALYSIS, ROUTINE W REFLEX  MICROSCOPIC     Status: Abnormal   Collection Time   03/25/12  3:27 PM      Component Value Range Comment   Color, Urine AMBER (*) YELLOW BIOCHEMICALS MAY BE AFFECTED BY COLOR   APPearance CLEAR  CLEAR    Specific Gravity, Urine 1.027  1.005 - 1.030    pH 5.5  5.0 - 8.0    Glucose, UA 100 (*) NEGATIVE mg/dL    Hgb urine dipstick MODERATE (*) NEGATIVE    Bilirubin Urine LARGE (*) NEGATIVE    Ketones, ur 15 (*) NEGATIVE mg/dL    Protein, ur 30 (*) NEGATIVE mg/dL    Urobilinogen, UA 1.0  0.0 - 1.0 mg/dL    Nitrite POSITIVE (*) NEGATIVE    Leukocytes, UA SMALL (*) NEGATIVE   URINE MICROSCOPIC-ADD ON     Status: Abnormal   Collection Time   03/25/12  3:27 PM      Component Value Range Comment   Squamous Epithelial / LPF FEW (*) RARE    WBC, UA 0-2  <3 WBC/hpf    RBC / HPF 3-6  <3 RBC/hpf    Bacteria, UA RARE  RARE    Casts GRANULAR CAST (*) NEGATIVE    Urine-Other MUCOUS PRESENT  URINE CULTURE     Status: Normal (Preliminary result)   Collection Time   03/25/12  3:28 PM      Component Value Range Comment   Specimen Description URINE, RANDOM      Special Requests NONE      Culture  Setup Time 03/25/2012 15:54      Colony Count PENDING      Culture Culture reincubated for better growth      Report Status PENDING     CULTURE, BLOOD (ROUTINE X 2)     Status: Normal (Preliminary result)   Collection Time   03/25/12  3:55 PM      Component Value Range Comment   Specimen Description BLOOD LEFT HAND      Special Requests BOTTLES DRAWN AEROBIC ONLY 5CC      Culture  Setup Time 03/25/2012 20:33      Culture        Value: GRAM NEGATIVE RODS     GRAM POSITIVE COCCI IN CHAINS     Note: Gram Stain Report Called to,Read Back By and Verified With: CARRIE H @1021  03/26/12 BY KRAWS   Report Status PENDING     CULTURE, BLOOD (ROUTINE X 2)     Status: Normal (Preliminary result)   Collection Time   03/25/12  4:00 PM      Component Value Range Comment   Specimen Description BLOOD LEFT ARM        Special Requests BOTTLES DRAWN AEROBIC ONLY 5CC      Culture  Setup Time 03/25/2012 20:33      Culture        Value: GRAM NEGATIVE RODS     GRAM POSITIVE COCCI IN CHAINS     Note: Gram Stain Report Called to,Read Back By and Verified With: CARRIE H @1021  03/26/12 BY KRAWS   Report Status PENDING     GLUCOSE, CAPILLARY     Status: Abnormal   Collection Time   03/25/12  4:16 PM      Component Value Range Comment   Glucose-Capillary 151 (*) 70 - 99 mg/dL    Comment 1 Notify RN     CARDIAC PANEL(CRET KIN+CKTOT+MB+TROPI)     Status: Normal   Collection Time   03/25/12  5:42 PM      Component Value Range Comment   Total CK 100  7 - 177 U/L    CK, MB 2.2  0.3 - 4.0 ng/mL    Troponin I <0.30  <0.30 ng/mL    Relative Index 2.2  0.0 - 2.5   GLUCOSE, CAPILLARY     Status: Normal   Collection Time   03/25/12  8:07 PM      Component Value Range Comment   Glucose-Capillary 91  70 - 99 mg/dL   MRSA PCR SCREENING     Status: Normal   Collection Time   03/25/12  9:10 PM      Component Value Range Comment   MRSA by PCR NEGATIVE  NEGATIVE   GLUCOSE, CAPILLARY     Status: Abnormal   Collection Time   03/25/12  9:45 PM      Component Value Range Comment   Glucose-Capillary 100 (*) 70 - 99 mg/dL    Comment 1 Notify RN      Comment 2 Documented in Chart     LACTIC ACID, PLASMA     Status: Abnormal   Collection Time   03/25/12 10:00 PM      Component Value Range Comment  Lactic Acid, Venous 3.5 (*) 0.5 - 2.2 mmol/L   PROCALCITONIN     Status: Normal   Collection Time   03/25/12 10:00 PM      Component Value Range Comment   Procalcitonin 9.25     CBC WITH DIFFERENTIAL     Status: Abnormal   Collection Time   03/26/12 12:29 AM      Component Value Range Comment   WBC 15.0 (*) 4.0 - 10.5 K/uL    RBC 3.24 (*) 3.87 - 5.11 MIL/uL    Hemoglobin 10.3 (*) 12.0 - 15.0 g/dL    HCT 16.1 (*) 09.6 - 46.0 %    MCV 98.1  78.0 - 100.0 fL    MCH 31.8  26.0 - 34.0 pg    MCHC 32.4  30.0 - 36.0 g/dL    RDW  04.5  40.9 - 81.1 %    Platelets 109 (*) 150 - 400 K/uL PLATELET COUNT CONFIRMED BY SMEAR   Neutrophils Relative 95 (*) 43 - 77 %    Lymphocytes Relative 2 (*) 12 - 46 %    Monocytes Relative 3  3 - 12 %    Eosinophils Relative 0  0 - 5 %    Basophils Relative 0  0 - 1 %    Band Neutrophils 0  0 - 10 %    Metamyelocytes Relative 0      Myelocytes 0      Promyelocytes Absolute 0      Blasts 0      nRBC 0  0 /100 WBC    Neutro Abs 14.2 (*) 1.7 - 7.7 K/uL    Lymphs Abs 0.3 (*) 0.7 - 4.0 K/uL    Monocytes Absolute 0.5  0.1 - 1.0 K/uL    Eosinophils Absolute 0.0  0.0 - 0.7 K/uL    Basophils Absolute 0.0  0.0 - 0.1 K/uL    WBC Morphology INCREASED BANDS (>20% BANDS)   MILD LEFT SHIFT (1-5% METAS, OCC MYELO, OCC BANDS)  COMPREHENSIVE METABOLIC PANEL     Status: Abnormal   Collection Time   03/26/12 12:29 AM      Component Value Range Comment   Sodium 137  135 - 145 mEq/L    Potassium 3.4 (*) 3.5 - 5.1 mEq/L    Chloride 103  96 - 112 mEq/L    CO2 20  19 - 32 mEq/L    Glucose, Bld 109 (*) 70 - 99 mg/dL    BUN 19  6 - 23 mg/dL    Creatinine, Ser 9.14  0.50 - 1.10 mg/dL    Calcium 7.6 (*) 8.4 - 10.5 mg/dL    Total Protein 4.8 (*) 6.0 - 8.3 g/dL    Albumin 2.5 (*) 3.5 - 5.2 g/dL    AST 782 (*) 0 - 37 U/L    ALT 216 (*) 0 - 35 U/L    Alkaline Phosphatase 143 (*) 39 - 117 U/L    Total Bilirubin 3.4 (*) 0.3 - 1.2 mg/dL    GFR calc non Af Amer >90  >90 mL/min    GFR calc Af Amer >90  >90 mL/min   CARDIAC PANEL(CRET KIN+CKTOT+MB+TROPI)     Status: Normal   Collection Time   03/26/12 12:29 AM      Component Value Range Comment   Total CK 103  7 - 177 U/L    CK, MB 2.0  0.3 - 4.0 ng/mL    Troponin I <0.30  <0.30 ng/mL  Relative Index 1.9  0.0 - 2.5   AMYLASE     Status: Normal   Collection Time   03/26/12 12:29 AM      Component Value Range Comment   Amylase 14  0 - 105 U/L   LIPASE, BLOOD     Status: Normal   Collection Time   03/26/12 12:29 AM      Component Value Range Comment    Lipase 11  11 - 59 U/L   BLOOD GAS, ARTERIAL     Status: Abnormal   Collection Time   03/26/12 12:40 AM      Component Value Range Comment   O2 Content 4.0      Delivery systems NASAL CANNULA      pH, Arterial 7.363  7.350 - 7.450    pCO2 arterial 39.1  35.0 - 45.0 mmHg    pO2, Arterial 65.0 (*) 80.0 - 100.0 mmHg    Bicarbonate 21.5  20.0 - 24.0 mEq/L    TCO2 22.7  0 - 100 mmol/L    Acid-base deficit 2.9 (*) 0.0 - 2.0 mmol/L    O2 Saturation 93.3      Patient temperature 99.7      Collection site LEFT RADIAL      Drawn by [phone]      Sample type ARTERIAL DRAW      Allens test (pass/fail) PASS  PASS   LACTIC ACID, PLASMA     Status: Abnormal   Collection Time   03/26/12  1:30 AM      Component Value Range Comment   Lactic Acid, Venous 2.5 (*) 0.5 - 2.2 mmol/L   CORTISOL     Status: Normal   Collection Time   03/26/12  1:30 AM      Component Value Range Comment   Cortisol, Plasma 42.3     PROCALCITONIN     Status: Normal   Collection Time   03/26/12  1:30 AM      Component Value Range Comment   Procalcitonin 11.59     TYPE AND SCREEN     Status: Normal   Collection Time   03/26/12  1:30 AM      Component Value Range Comment   ABO/RH(D) O POS      Antibody Screen NEG      Sample Expiration 03/29/2012     CARDIAC PANEL(CRET KIN+CKTOT+MB+TROPI)     Status: Normal   Collection Time   03/26/12  1:30 AM      Component Value Range Comment   Total CK 104  7 - 177 U/L    CK, MB 2.1  0.3 - 4.0 ng/mL    Troponin I <0.30  <0.30 ng/mL    Relative Index 2.0  0.0 - 2.5   ABO/RH     Status: Normal   Collection Time   03/26/12  1:30 AM      Component Value Range Comment   ABO/RH(D) O POS     CARBOXYHEMOGLOBIN     Status: Abnormal   Collection Time   03/26/12  3:05 AM      Component Value Range Comment   Total hemoglobin 11.3 (*) 12.0 - 16.0 g/dL    O2 Saturation 81.1      Carboxyhemoglobin 0.7  0.5 - 1.5 %    Methemoglobin 1.9 (*) 0.0 - 1.5 %   GLUCOSE, CAPILLARY     Status:  Abnormal   Collection Time   03/26/12  3:59 AM  Component Value Range Comment   Glucose-Capillary 130 (*) 70 - 99 mg/dL   POCT I-STAT 3, BLOOD GAS (G3+)     Status: Abnormal   Collection Time   03/26/12  4:12 AM      Component Value Range Comment   pH, Arterial 7.359  7.350 - 7.450    pCO2 arterial 35.5  35.0 - 45.0 mmHg    pO2, Arterial 68.0 (*) 80.0 - 100.0 mmHg    Bicarbonate 20.0  20.0 - 24.0 mEq/L    TCO2 21  0 - 100 mmol/L    O2 Saturation 93.0      Acid-base deficit 5.0 (*) 0.0 - 2.0 mmol/L    Patient temperature 98.8 F      Collection site RADIAL, ALLEN'S TEST ACCEPTABLE      Drawn by RT      Sample type ARTERIAL     BASIC METABOLIC PANEL     Status: Abnormal   Collection Time   03/26/12  4:40 AM      Component Value Range Comment   Sodium 139  135 - 145 mEq/L    Potassium 3.1 (*) 3.5 - 5.1 mEq/L    Chloride 104  96 - 112 mEq/L    CO2 23  19 - 32 mEq/L    Glucose, Bld 142 (*) 70 - 99 mg/dL    BUN 20  6 - 23 mg/dL    Creatinine, Ser 1.61  0.50 - 1.10 mg/dL    Calcium 8.1 (*) 8.4 - 10.5 mg/dL    GFR calc non Af Amer >90  >90 mL/min    GFR calc Af Amer >90  >90 mL/min   CBC     Status: Abnormal   Collection Time   03/26/12  4:40 AM      Component Value Range Comment   WBC 22.4 (*) 4.0 - 10.5 K/uL    RBC 3.59 (*) 3.87 - 5.11 MIL/uL    Hemoglobin 11.2 (*) 12.0 - 15.0 g/dL    HCT 09.6 (*) 04.5 - 46.0 %    MCV 97.5  78.0 - 100.0 fL    MCH 31.2  26.0 - 34.0 pg    MCHC 32.0  30.0 - 36.0 g/dL    RDW 40.9  81.1 - 91.4 %    Platelets 127 (*) 150 - 400 K/uL   GLUCOSE, CAPILLARY     Status: Abnormal   Collection Time   03/26/12  7:37 AM      Component Value Range Comment   Glucose-Capillary 117 (*) 70 - 99 mg/dL    Comment 1 Documented in Chart      Comment 2 Notify RN     GLUCOSE, CAPILLARY     Status: Abnormal   Collection Time   03/26/12 11:46 AM      Component Value Range Comment   Glucose-Capillary 132 (*) 70 - 99 mg/dL    Comment 1 Notify RN      Comment 2  Documented in Chart     FIBRINOGEN     Status: Abnormal   Collection Time   03/26/12 12:15 PM      Component Value Range Comment   Fibrinogen 571 (*) 204 - 475 mg/dL   PROTIME-INR     Status: Abnormal   Collection Time   03/26/12 12:15 PM      Component Value Range Comment   Prothrombin Time 18.1 (*) 11.6 - 15.2 seconds    INR 1.47  0.00 - 1.49  APTT     Status: Normal   Collection Time   03/26/12 12:15 PM      Component Value Range Comment   aPTT 37  24 - 37 seconds   CBC     Status: Abnormal   Collection Time   03/26/12  3:30 PM      Component Value Range Comment   WBC 4.4  4.0 - 10.5 K/uL    RBC 3.37 (*) 3.87 - 5.11 MIL/uL    Hemoglobin 10.3 (*) 12.0 - 15.0 g/dL    HCT 16.1 (*) 09.6 - 46.0 %    MCV 96.7  78.0 - 100.0 fL    MCH 30.6  26.0 - 34.0 pg    MCHC 31.6  30.0 - 36.0 g/dL    RDW 04.5  40.9 - 81.1 %    Platelets 64 (*) 150 - 400 K/uL   GLUCOSE, CAPILLARY     Status: Normal   Collection Time   03/26/12  4:18 PM      Component Value Range Comment   Glucose-Capillary 99  70 - 99 mg/dL    Comment 1 Notify RN      Comment 2 Documented in Chart     GLUCOSE, CAPILLARY     Status: Abnormal   Collection Time   03/26/12  8:28 PM      Component Value Range Comment   Glucose-Capillary 122 (*) 70 - 99 mg/dL    Comment 1 Documented in Chart      Comment 2 Notify RN     GLUCOSE, CAPILLARY     Status: Abnormal   Collection Time   03/26/12 11:43 PM      Component Value Range Comment   Glucose-Capillary 128 (*) 70 - 99 mg/dL   BASIC METABOLIC PANEL     Status: Abnormal   Collection Time   03/27/12  4:00 AM      Component Value Range Comment   Sodium 130 (*) 135 - 145 mEq/L DELTA CHECK NOTED   Potassium 3.8  3.5 - 5.1 mEq/L DELTA CHECK NOTED   Chloride 101  96 - 112 mEq/L    CO2 19  19 - 32 mEq/L    Glucose, Bld 174 (*) 70 - 99 mg/dL    BUN 24 (*) 6 - 23 mg/dL    Creatinine, Ser 9.14  0.50 - 1.10 mg/dL    Calcium 7.4 (*) 8.4 - 10.5 mg/dL    GFR calc non Af Amer 86 (*) >90  mL/min    GFR calc Af Amer >90  >90 mL/min   CBC     Status: Abnormal   Collection Time   03/27/12  4:00 AM      Component Value Range Comment   WBC 26.3 (*) 4.0 - 10.5 K/uL    RBC 3.33 (*) 3.87 - 5.11 MIL/uL    Hemoglobin 10.3 (*) 12.0 - 15.0 g/dL    HCT 78.2 (*) 95.6 - 46.0 %    MCV 95.8  78.0 - 100.0 fL    MCH 30.9  26.0 - 34.0 pg    MCHC 32.3  30.0 - 36.0 g/dL    RDW 21.3  08.6 - 57.8 %    Platelets 44 (*) 150 - 400 K/uL   HEPATIC FUNCTION PANEL     Status: Abnormal   Collection Time   03/27/12  4:00 AM      Component Value Range Comment   Total Protein 4.6 (*) 6.0 - 8.3 g/dL  Albumin 2.0 (*) 3.5 - 5.2 g/dL    AST 76 (*) 0 - 37 U/L    ALT 139 (*) 0 - 35 U/L    Alkaline Phosphatase 156 (*) 39 - 117 U/L    Total Bilirubin 4.5 (*) 0.3 - 1.2 mg/dL    Bilirubin, Direct 3.8 (*) 0.0 - 0.3 mg/dL    Indirect Bilirubin 0.7  0.3 - 0.9 mg/dL   LACTIC ACID, PLASMA     Status: Abnormal   Collection Time   03/27/12  4:00 AM      Component Value Range Comment   Lactic Acid, Venous 2.5 (*) 0.5 - 2.2 mmol/L   PROTIME-INR     Status: Abnormal   Collection Time   03/27/12  4:00 AM      Component Value Range Comment   Prothrombin Time 15.7 (*) 11.6 - 15.2 seconds    INR 1.22  0.00 - 1.49   PROCALCITONIN     Status: Normal   Collection Time   03/27/12  4:00 AM      Component Value Range Comment   Procalcitonin 39.94     GLUCOSE, CAPILLARY     Status: Abnormal   Collection Time   03/27/12  4:11 AM      Component Value Range Comment   Glucose-Capillary 141 (*) 70 - 99 mg/dL   GLUCOSE, CAPILLARY     Status: Abnormal   Collection Time   03/27/12  7:22 AM      Component Value Range Comment   Glucose-Capillary 147 (*) 70 - 99 mg/dL    Comment 1 Documented in Chart      Comment 2 Notify RN      Ct Abdomen Pelvis W Contrast  03/26/2012  *RADIOLOGY REPORT*  Clinical Data: Abdominal pain, septic shock, nausea/vomiting  CT ABDOMEN AND PELVIS WITH CONTRAST  Technique:  Multidetector CT imaging  of the abdomen and pelvis was performed following the standard protocol during bolus administration of intravenous contrast.  Contrast: 80mL OMNIPAQUE IOHEXOL 300 MG/ML  SOLN  Comparison: None.  Findings: Very large hiatal hernia, incompletely visualized, containing stomach to the right of midline (series 2/image 7), multiple loops of opacified and nonopacified bowel, and pancreas centrally (series 2/image 6).  Liver is notable for moderate to severe central intrahepatic ductal dilatation.  Common bile duct is markedly dilated with a 1.5 x 1.0 cm abnormal soft tissue lesion centrally (series 2/image 22) and a 2.7 x 1.9 cm obstructing mass distally near the ampulla in the left abdomen (series 2/image 15).  Status post cholecystectomy.  Spleen and adrenal glands are unremarkable.  5 mm nonobstructing left upper pole calculus (series 2/image 11). Right kidney is unremarkable.  No hydronephrosis.  Enteric tube terminates in the proximal duodenum in the left abdomen (series 2/image 23).  Contrast passes into the colon, indicating that the large hernia is nonobstructive.  Colonic diverticulosis, without associated inflammatory changes.  Atherosclerotic calcifications of the abdominal aorta and branch vessels.  Small volume abdominopelvic ascites.  Status post hysterectomy.  No adnexal masses.  Bladder is notable for nondependent gas and an indwelling Foley catheter.  Prior ORIF of the proximal left femur.  Degenerative changes of the visualized thoracolumbar spine.  Mild superior endplate compression deformities at L2 and L5.  IMPRESSION:  Distorted anatomy with very large hiatal hernia containing stomach, multiple loops of bowel, and pancreas, incompletely visualized.  No evidence of bowel obstruction.  Marked dilatation of the common bile duct with at least two intraductal lesions,  as described above, including a 2.7 x 1.9 cm obstructing mass near the ampulla. Moderate to severe intrahepatic ductal dilatation.   Additional ancillary findings as above.  Original Report Authenticated By: Charline Bills, M.D.   Dg Chest Port 1 View  03/26/2012  *RADIOLOGY REPORT*  Clinical Data: Endotracheal tube placement.  Central venous catheter placement.  PORTABLE CHEST - 1 VIEW  Comparison: 03/26/2012.  Findings: Endotracheal tube is 15 mm from the carina.  New right IJ central line is present with its tip one vertebral body below the carina, in the mid-to-lower SVC.  Large bilateral pleural effusions.  Cardiopericardial silhouette mostly obscured by effusions.  Basilar atelectasis.  Pulmonary vascular congestion. Bobby pin projects over the right upper chest.  IMPRESSION:  1.  New endotracheal tube is 15 mm from the carina. 2.  Uncomplicated right IJ central line placement with the tip in the mid SVC. 3.  Bilateral pleural effusions, left greater than right with associated compressive atelectasis.  Probable cardiomegaly with portions of the cardiopericardial silhouette obscured  Original Report Authenticated By: Andreas Newport, M.D.   Dg Chest Port 1 View  03/26/2012  *RADIOLOGY REPORT*  Clinical Data: Chest pressure.  PORTABLE CHEST - 1 VIEW  Comparison: 03/24/2012.  Findings: The cardiopericardial size silhouette is obscured. Surgical clip is present in the lower chest. Bilateral left greater than right pleural effusions are present, increased from prior exam.  Additionally, there is pulmonary vascular congestion and bilateral lower lobe airspace disease, most compatible with pulmonary edema in the interval since the prior exam.  IMPRESSION: Findings compatible with moderate CHF superimposed on chronic changes.  Bilateral left greater than right pleural effusions.  Original Report Authenticated By: Andreas Newport, M.D.    Review of Systems  Constitutional: Positive for fever.  Gastrointestinal: Positive for abdominal pain.  Neurological: Positive for weakness.    Blood pressure 119/69, pulse 77, temperature 98.4 F  (36.9 C), temperature source Oral, resp. rate 18, height 5\' 1"  (1.549 m), weight 102 lb 11.8 oz (46.6 kg), SpO2 100.00%. Physical Exam  Cardiovascular: Normal rate and regular rhythm.   Respiratory: She is in respiratory distress.       On vent  GI: Soft. Bowel sounds are normal.  Musculoskeletal:       Unresponsive - vent  Skin: Skin is warm.     Assessment/Plan CBD obstruction; cholangitis - sepsis Intubated Scheduled for PTC/biliary drain placement pts family aware of procedure benefits and risks and agreeable to proceed Consent signed and in chart Continue Vanco and Zosyn plts 44 today; hanging 1 unit plts now  Toan Mort A 03/27/2012, 9:58 AM

## 2012-03-27 NOTE — Procedures (Signed)
Interventional Radiology Procedure Note  Procedure: Left percutaneous transhepatic cholangiogram and placement of a percutaneous biliary drain.  Drain enters via segment three and terminates in the distal CBD. Distal CBD is completely obstructed. Complications: None Recommendations: - Maintain to gravity drainage - Record biliary output - Follow Br and bile cultures - 5 mg Vitamin K subcutaneously Q week while to external drainage to prevent coagulation factor depletion - After pt decompressed and stabilized, return to IR for cholangiogram and attempt at internalization.   Signed,  Sterling Big, MD Vascular & Interventional Radiologist [facility] Radiology

## 2012-03-28 ENCOUNTER — Inpatient Hospital Stay (HOSPITAL_COMMUNITY): Payer: Medicare Other

## 2012-03-28 LAB — GLUCOSE, CAPILLARY
Glucose-Capillary: 121 mg/dL — ABNORMAL HIGH (ref 70–99)
Glucose-Capillary: 112 mg/dL — ABNORMAL HIGH (ref 70–99)

## 2012-03-28 LAB — BASIC METABOLIC PANEL
Chloride: 97 mEq/L (ref 96–112)
Creatinine, Ser: 0.55 mg/dL (ref 0.50–1.10)
GFR calc Af Amer: >90 mL/min (ref >90)
GFR calc non Af Amer: >90 mL/min (ref >90)
Potassium: 3.8 mEq/L (ref 3.5–5.1)

## 2012-03-28 LAB — CBC
HCT: 29.5 % — ABNORMAL LOW (ref 36.0–46.0)
Hemoglobin: 9.8 g/dL — ABNORMAL LOW (ref 12.0–15.0)
RDW: 13.6 % (ref 11.5–15.5)
WBC: 23 10*3/uL — ABNORMAL HIGH (ref 4.0–10.5)

## 2012-03-28 LAB — DIC (DISSEMINATED INTRAVASCULAR COAGULATION)PANEL
Fibrinogen: 541 mg/dL — ABNORMAL HIGH (ref 204–475)
INR: 1.07 (ref 0.00–1.49)
aPTT: 30 seconds (ref 24–37)

## 2012-03-28 LAB — LACTIC ACID, PLASMA: Lactic Acid, Venous: 1.7 mmol/L (ref 0.5–2.2)

## 2012-03-28 LAB — POCT I-STAT 3, ART BLOOD GAS (G3+)
Bicarbonate: 18.7 mEq/L — ABNORMAL LOW (ref 20.0–24.0)
Patient temperature: 37
pH, Arterial: 7.365 (ref 7.350–7.450)

## 2012-03-28 LAB — PROTIME-INR
INR: 1.12 (ref 0.00–1.49)
Prothrombin Time: 14.6 seconds (ref 11.6–15.2)

## 2012-03-28 LAB — URINE CULTURE

## 2012-03-28 LAB — PREPARE PLATELET PHERESIS

## 2012-03-28 LAB — HEPATIC FUNCTION PANEL
AST: 66 U/L — ABNORMAL HIGH (ref 0–37)
Albumin: 2.1 g/dL — ABNORMAL LOW (ref 3.5–5.2)
Total Bilirubin: 4.5 mg/dL — ABNORMAL HIGH (ref 0.3–1.2)
Total Protein: 4.7 g/dL — ABNORMAL LOW (ref 6.0–8.3)

## 2012-03-28 MED ORDER — CEFTAZIDIME 2 G IJ SOLR: 2.0000 g | Freq: Two times a day (BID) | INTRAMUSCULAR | Status: DC

## 2012-03-28 MED ORDER — SODIUM CHLORIDE 0.9 % IV BOLUS (SEPSIS)
500.0000 mL | Freq: Once | INTRAVENOUS | Status: AC
  Administered 2012-03-28: 500 mL via INTRAVENOUS

## 2012-03-28 MED ORDER — METRONIDAZOLE IN NACL 5-0.79 MG/ML-% IV SOLN
500.0000 mg | Freq: Three times a day (TID) | INTRAVENOUS | Status: DC
  Administered 2012-03-28 – 2012-03-31 (×10): 500 mg via INTRAVENOUS
  Filled 2012-03-28 (×11): qty 100

## 2012-03-28 MED ORDER — VITAL AF 1.2 CAL PO LIQD
1000.0000 mL | ORAL | Status: DC
  Administered 2012-03-28 – 2012-04-01 (×5): 1000 mL
  Filled 2012-03-28 (×7): qty 1000

## 2012-03-28 MED ORDER — DEXTROSE 5 % IV SOLN
2.0000 g | Freq: Two times a day (BID) | INTRAVENOUS | Status: DC
  Administered 2012-03-28 – 2012-03-31 (×6): 2 g via INTRAVENOUS
  Filled 2012-03-28 (×10): qty 2

## 2012-03-28 MED ORDER — ADULT MULTIVITAMIN W/MINERALS CH
1.0000 | ORAL_TABLET | Freq: Every day | ORAL | Status: DC
  Administered 2012-03-28 – 2012-04-02 (×6): 1
  Filled 2012-03-28 (×7): qty 1

## 2012-03-28 NOTE — Progress Notes (Signed)
CRITICAL VALUE ALERT  Critical value received:  Platelets = 23  Date of notification:  03/28/12  Time of notification:  0618  Critical value read back:yes  Nurse who received alert:  Wilhelmina Mcardle, RN  MD notified (1st page):  Dr. Darrick Penna (PCCM)  Time of first page:  0620  MD notified (2nd page):  Time of second page:  Responding MD:  Dr. Darrick Penna (PCCM)  Time MD responded:  [phone]

## 2012-03-28 NOTE — Progress Notes (Signed)
Progress Note following Consultation  Patient Identification: Laurie Sparks  Date of Evaluation: 03/25/2012  Reason for Consult: suicidal ideation  Referring Provider: Dr. Isidoro Donning    DIAGNOSIS:  AXIS I  Major Depression recurrent, severe with suicidal ideation   AXIS II  Deffered   AXIS III  See medical notes.   AXIS IV  economic problems, other psychosocial or environmental problems, problems related to social environment, problems with primary support group and too reluctant to ask for help   AXIS V  51-60 moderate symptoms    Assessment and plan;   COMPUTER MALFUNCTION 8/16-17/13 Seen ~ 1: 45 pm  Spoke with two sons at bedside  RN Pt was transferred to [id number] and intubated for exacerbation of COPD.   Vital signs are variable but trending toward normal.  She is sedated  Dr. Requests medication and Rx is researched for least expensive Rx pt could afford.  RECOMMENDATION:  1.  Suggest fluoxetine  10 mg daily, may increase to 20 mg oral daily for depression 2.  Suggest Buspar, buspirone 10 mg  Oral 3X daily 3.  Suggest trazodone 1/2 - 1 tab 50 mg at bedtime for sleep 4.  No other psychiatric needs identified   MD psychiatrist signs off  Aswad Wandrey J. Ferol Luz, MD Psychiatrist  .03/28/2012 4:00 PM

## 2012-03-28 NOTE — Progress Notes (Signed)
Subjective: Pt remains sedated on vent.  Objective: Physical Exam: BP 128/60  Pulse 67  Temp 97.6 F (36.4 C) (Oral)  Resp 15  Ht 5\' 1"  (1.549 m)  Wt 113 lb 15.7 oz (51.7 kg)  BMI 21.54 kg/m2  SpO2 100% LUQ drain intact, site clean, no retraction Output dark bilious, 150cc recorded yesterday, about 25cc in bag at present.   Labs: CBC  Basename 03/28/12 0415 03/27/12 0400  WBC 23.0* 26.3*  HGB 9.8* 10.3*  HCT 29.5* 31.9*  PLT 23* 44*   BMET  Basename 03/28/12 0415 03/27/12 0400  NA 127* 130*  K 3.8 3.8  CL 97 101  CO2 21 19  GLUCOSE 146* 174*  BUN 27* 24*  CREATININE 0.55 0.68  CALCIUM 8.3* 7.4*   LFT  Basename 03/28/12 0415 03/26/12 0029  PROT 4.7* --  ALBUMIN 2.1* --  AST 66* --  ALT 119* --  ALKPHOS 170* --  BILITOT 4.5* --  BILIDIR 3.4* --  IBILI 1.1* --  LIPASE -- 11   PT/INR  Basename 03/28/12 0415 03/27/12 0400  LABPROT 14.6 15.7*  INR 1.12 1.22     Studies/Results: Ct Abdomen Pelvis W Contrast  03/26/2012  *RADIOLOGY REPORT*  Clinical Data: Abdominal pain, septic shock, nausea/vomiting  CT ABDOMEN AND PELVIS WITH CONTRAST  Technique:  Multidetector CT imaging of the abdomen and pelvis was performed following the standard protocol during bolus administration of intravenous contrast.  Contrast: 80mL OMNIPAQUE IOHEXOL 300 MG/ML  SOLN  Comparison: None.  Findings: Very large hiatal hernia, incompletely visualized, containing stomach to the right of midline (series 2/image 7), multiple loops of opacified and nonopacified bowel, and pancreas centrally (series 2/image 6).  Liver is notable for moderate to severe central intrahepatic ductal dilatation.  Common bile duct is markedly dilated with a 1.5 x 1.0 cm abnormal soft tissue lesion centrally (series 2/image 22) and a 2.7 x 1.9 cm obstructing mass distally near the ampulla in the left abdomen (series 2/image 15).  Status post cholecystectomy.  Spleen and adrenal glands are unremarkable.  5 mm  nonobstructing left upper pole calculus (series 2/image 11). Right kidney is unremarkable.  No hydronephrosis.  Enteric tube terminates in the proximal duodenum in the left abdomen (series 2/image 23).  Contrast passes into the colon, indicating that the large hernia is nonobstructive.  Colonic diverticulosis, without associated inflammatory changes.  Atherosclerotic calcifications of the abdominal aorta and branch vessels.  Small volume abdominopelvic ascites.  Status post hysterectomy.  No adnexal masses.  Bladder is notable for nondependent gas and an indwelling Foley catheter.  Prior ORIF of the proximal left femur.  Degenerative changes of the visualized thoracolumbar spine.  Mild superior endplate compression deformities at L2 and L5.  IMPRESSION:  Distorted anatomy with very large hiatal hernia containing stomach, multiple loops of bowel, and pancreas, incompletely visualized.  No evidence of bowel obstruction.  Marked dilatation of the common bile duct with at least two intraductal lesions, as described above, including a 2.7 x 1.9 cm obstructing mass near the ampulla. Moderate to severe intrahepatic ductal dilatation.  Additional ancillary findings as above.  Original Report Authenticated By: Charline Bills, M.D.   Ir Perc Biliary Drain-ext Only  03/27/2012  *RADIOLOGY REPORT*  IR PERCUTANEOUS TRANSHEPATIC CHOLANGIOGRAM AND PERCUTANEOUS BILIARY DRAIN PLACEMENT  Date: 03/27/2012  Clinical History: 71 year old female with severe sepsis in the setting of obstructed cholangitis. By CT scan, she has an obstructing mass of uncertain etiology in the distal common bile duct.  The  left and right intrahepatic ductal system appeared to communicate to.  We will proceed with placement of a left-sided approach percutaneous biliary drain.  If necessary, bilateral biliary drains will be placed.  Procedures Performed: 1. Ultrasound-guided puncture of a peripheral left bile duct 2.  Percutaneous transhepatic  cholangiogram 3.  Placement of a percutaneous transhepatic biliary drain  Interventional Radiologist:  Sterling Big, MD  Sedation: Moderate (conscious) sedation was used.  2 mg Versed, 50 mcg Fentanyl were administered intravenously.  The patient's vital signs were monitored continuously by radiology nursing throughout the procedure.  Sedation Time: 53 minutes  Fluoroscopy time: 23.4  Contrast volume: 60 ml Omnipaque-300 administered into the biliary system  PROCEDURE/FINDINGS:   Informed consent was obtained from the patient following explanation of the procedure, risks, benefits and alternatives. The patient understands, agrees and consents for the procedure. All questions were addressed. A time out was performed.  Maximal barrier sterile technique utilized including caps, mask, sterile gowns, sterile gloves, large sterile drape, hand hygiene, and betadine skin prep.  The mid epigastric region to the left of midline was interrogated with ultrasound.  Multiple dilated left sided intrahepatic bile duct were identified.  A peripheral (3rd order) bile duct was identified.  Local anesthesia was achieved with infiltration of 1% lidocaine.  Under direct sonographic guidance, the peripheral biliary radicle was punctured with a 21-gauge micropuncture needle. Images obtained stored for the medical record.  An 0.018-inch wire was advanced into the central bile ducts followed by the Accustick sheath.  Hand injection of contrast material through the sheath was performed to retain a percutaneous transhepatic cholangiogram.  The patient's anatomy is markedly distorted secondary to her large hiatal hernia and partial herniation of the stomach, small bowel and pancreas into the chest. Her common bile duct is markedly dilated  and ascends toward the left lower chest.  There is diffuse bilateral intrahepatic biliary ductal dilatation. The distal common bile duct is completely obstructed.  A 4-French Glidecatheter and a glide  wire were used to navigate through the l left biliary ductal system into the common bile duct. The guide wire was then exchanged for the Amplatz wire.  A 8-French percutaneous biliary drain was then advanced over the wire and positioned in the distal common bile duct just proximal to the obstruction under fluoroscopic guidance.  A sample of green bile was aspirated and sent to the lab for culture.  The tube was then connected to a bag for gravity drainage and secured to the skin using 2-0 Prolene suture.  There is no immediate complication, the patient tolerated the procedure well.  She was returned to the intensive care unit in unchanged condition.  IMPRESSION:  1.  Percutaneous transhepatic cholangiogram demonstrates complete obstruction of the distal common bile duct with marked common bile duct and intrahepatic ductal dilatation.  The obstruction is relatively smooth.  Differential considerations include obstructing neoplasm (given location ampullary, duodenal, pancreatic and less likely cholangiocarcinoma are possible primaries), and potentially benign biliary stricture.  2.  Distorted common bile duct anatomy secondary to large hiatal hernia and upward herniation of the duodenum, pancreas and distal common bile duct.  3.  Successful placement of an 8 [place] external percutaneous transhepatic biliary drain. Maintain drain to gravity drainage for now.  Once the patient has stabilized, and decompressed she should return interventional radiology for attempted crossing of the obstructing mass/stricture and internalization of the tube.  4. Once stabilized, further evaluation with MRI and MRCP may be useful to evaluate for  underlying mass.  Given the patient's difficult anatomy, if ERCP is not possible tissue diagnosis can be obtained by percutaneous transhepatic biopsy at the time of internalization of the tube.  Signed,  Sterling Big, MD Vascular & Interventional Radiologist [facility] Radiology  Original  Report Authenticated By: Vilma Prader   Ir Ptc  03/27/2012  *RADIOLOGY REPORT*  IR PERCUTANEOUS TRANSHEPATIC CHOLANGIOGRAM AND PERCUTANEOUS BILIARY DRAIN PLACEMENT  Date: 03/27/2012  Clinical History: 71 year old female with severe sepsis in the setting of obstructed cholangitis. By CT scan, she has an obstructing mass of uncertain etiology in the distal common bile duct.  The left and right intrahepatic ductal system appeared to communicate to.  We will proceed with placement of a left-sided approach percutaneous biliary drain.  If necessary, bilateral biliary drains will be placed.  Procedures Performed: 1. Ultrasound-guided puncture of a peripheral left bile duct 2.  Percutaneous transhepatic cholangiogram 3.  Placement of a percutaneous transhepatic biliary drain  Interventional Radiologist:  Sterling Big, MD  Sedation: Moderate (conscious) sedation was used.  2 mg Versed, 50 mcg Fentanyl were administered intravenously.  The patient's vital signs were monitored continuously by radiology nursing throughout the procedure.  Sedation Time: 53 minutes  Fluoroscopy time: 23.4  Contrast volume: 60 ml Omnipaque-300 administered into the biliary system  PROCEDURE/FINDINGS:   Informed consent was obtained from the patient following explanation of the procedure, risks, benefits and alternatives. The patient understands, agrees and consents for the procedure. All questions were addressed. A time out was performed.  Maximal barrier sterile technique utilized including caps, mask, sterile gowns, sterile gloves, large sterile drape, hand hygiene, and betadine skin prep.  The mid epigastric region to the left of midline was interrogated with ultrasound.  Multiple dilated left sided intrahepatic bile duct were identified.  A peripheral (3rd order) bile duct was identified.  Local anesthesia was achieved with infiltration of 1% lidocaine.  Under direct sonographic guidance, the peripheral biliary radicle was punctured with a  21-gauge micropuncture needle. Images obtained stored for the medical record.  An 0.018-inch wire was advanced into the central bile ducts followed by the Accustick sheath.  Hand injection of contrast material through the sheath was performed to retain a percutaneous transhepatic cholangiogram.  The patient's anatomy is markedly distorted secondary to her large hiatal hernia and partial herniation of the stomach, small bowel and pancreas into the chest. Her common bile duct is markedly dilated  and ascends toward the left lower chest.  There is diffuse bilateral intrahepatic biliary ductal dilatation. The distal common bile duct is completely obstructed.  A 4-French Glidecatheter and a glide wire were used to navigate through the l left biliary ductal system into the common bile duct. The guide wire was then exchanged for the Amplatz wire.  A 8-French percutaneous biliary drain was then advanced over the wire and positioned in the distal common bile duct just proximal to the obstruction under fluoroscopic guidance.  A sample of green bile was aspirated and sent to the lab for culture.  The tube was then connected to a bag for gravity drainage and secured to the skin using 2-0 Prolene suture.  There is no immediate complication, the patient tolerated the procedure well.  She was returned to the intensive care unit in unchanged condition.  IMPRESSION:  1.  Percutaneous transhepatic cholangiogram demonstrates complete obstruction of the distal common bile duct with marked common bile duct and intrahepatic ductal dilatation.  The obstruction is relatively smooth.  Differential considerations include obstructing neoplasm (  given location ampullary, duodenal, pancreatic and less likely cholangiocarcinoma are possible primaries), and potentially benign biliary stricture.  2.  Distorted common bile duct anatomy secondary to large hiatal hernia and upward herniation of the duodenum, pancreas and distal common bile duct.  3.   Successful placement of an 8 [place] external percutaneous transhepatic biliary drain. Maintain drain to gravity drainage for now.  Once the patient has stabilized, and decompressed she should return interventional radiology for attempted crossing of the obstructing mass/stricture and internalization of the tube.  4. Once stabilized, further evaluation with MRI and MRCP may be useful to evaluate for underlying mass.  Given the patient's difficult anatomy, if ERCP is not possible tissue diagnosis can be obtained by percutaneous transhepatic biopsy at the time of internalization of the tube.  Signed,  Sterling Big, MD Vascular & Interventional Radiologist [facility] Radiology  Original Report Authenticated By: Vilma Prader   Ir Fluoro Guide Ndl Plmt / Bx  03/27/2012  *RADIOLOGY REPORT*  IR PERCUTANEOUS TRANSHEPATIC CHOLANGIOGRAM AND PERCUTANEOUS BILIARY DRAIN PLACEMENT  Date: 03/27/2012  Clinical History: 71 year old female with severe sepsis in the setting of obstructed cholangitis. By CT scan, she has an obstructing mass of uncertain etiology in the distal common bile duct.  The left and right intrahepatic ductal system appeared to communicate to.  We will proceed with placement of a left-sided approach percutaneous biliary drain.  If necessary, bilateral biliary drains will be placed.  Procedures Performed: 1. Ultrasound-guided puncture of a peripheral left bile duct 2.  Percutaneous transhepatic cholangiogram 3.  Placement of a percutaneous transhepatic biliary drain  Interventional Radiologist:  Sterling Big, MD  Sedation: Moderate (conscious) sedation was used.  2 mg Versed, 50 mcg Fentanyl were administered intravenously.  The patient's vital signs were monitored continuously by radiology nursing throughout the procedure.  Sedation Time: 53 minutes  Fluoroscopy time: 23.4  Contrast volume: 60 ml Omnipaque-300 administered into the biliary system  PROCEDURE/FINDINGS:   Informed consent was obtained from  the patient following explanation of the procedure, risks, benefits and alternatives. The patient understands, agrees and consents for the procedure. All questions were addressed. A time out was performed.  Maximal barrier sterile technique utilized including caps, mask, sterile gowns, sterile gloves, large sterile drape, hand hygiene, and betadine skin prep.  The mid epigastric region to the left of midline was interrogated with ultrasound.  Multiple dilated left sided intrahepatic bile duct were identified.  A peripheral (3rd order) bile duct was identified.  Local anesthesia was achieved with infiltration of 1% lidocaine.  Under direct sonographic guidance, the peripheral biliary radicle was punctured with a 21-gauge micropuncture needle. Images obtained stored for the medical record.  An 0.018-inch wire was advanced into the central bile ducts followed by the Accustick sheath.  Hand injection of contrast material through the sheath was performed to retain a percutaneous transhepatic cholangiogram.  The patient's anatomy is markedly distorted secondary to her large hiatal hernia and partial herniation of the stomach, small bowel and pancreas into the chest. Her common bile duct is markedly dilated  and ascends toward the left lower chest.  There is diffuse bilateral intrahepatic biliary ductal dilatation. The distal common bile duct is completely obstructed.  A 4-French Glidecatheter and a glide wire were used to navigate through the l left biliary ductal system into the common bile duct. The guide wire was then exchanged for the Amplatz wire.  A 8-French percutaneous biliary drain was then advanced over the wire and positioned in the distal  common bile duct just proximal to the obstruction under fluoroscopic guidance.  A sample of green bile was aspirated and sent to the lab for culture.  The tube was then connected to a bag for gravity drainage and secured to the skin using 2-0 Prolene suture.  There is no  immediate complication, the patient tolerated the procedure well.  She was returned to the intensive care unit in unchanged condition.  IMPRESSION:  1.  Percutaneous transhepatic cholangiogram demonstrates complete obstruction of the distal common bile duct with marked common bile duct and intrahepatic ductal dilatation.  The obstruction is relatively smooth.  Differential considerations include obstructing neoplasm (given location ampullary, duodenal, pancreatic and less likely cholangiocarcinoma are possible primaries), and potentially benign biliary stricture.  2.  Distorted common bile duct anatomy secondary to large hiatal hernia and upward herniation of the duodenum, pancreas and distal common bile duct.  3.  Successful placement of an 8 [place] external percutaneous transhepatic biliary drain. Maintain drain to gravity drainage for now.  Once the patient has stabilized, and decompressed she should return interventional radiology for attempted crossing of the obstructing mass/stricture and internalization of the tube.  4. Once stabilized, further evaluation with MRI and MRCP may be useful to evaluate for underlying mass.  Given the patient's difficult anatomy, if ERCP is not possible tissue diagnosis can be obtained by percutaneous transhepatic biopsy at the time of internalization of the tube.  Signed,  Sterling Big, MD Vascular & Interventional Radiologist [facility] Radiology  Original Report Authenticated By: Alvino Blood Chest Port 1 View  03/28/2012  *RADIOLOGY REPORT*  Clinical Data: Respiratory difficulty  PORTABLE CHEST - 1 VIEW  Comparison: 03/26/2012  Findings: Cardiomegaly.  Endotracheal tube and right internal jugular vein central venous catheter stable.  NG tube tip is in the body of the stomach.  Epigastric biliary drain placed.  Bilateral pleural effusions and bilateral airspace disease not significantly changed.  No pneumothorax.  IMPRESSION: NG tube placed.  Stable bilateral airspace  disease and bilateral pleural effusions.  Original Report Authenticated By: Donavan Burnet, M.D.    Assessment/Plan: S/p Left percutaneous transhepatic cholangiogram and placement of a percutaneous biliary drain. Drain enters via segment three and terminates in the distal CBD. Bilious output Bilirubin unchanged at 4.5, hopefully will see this start to trend down. Cont to follow   LOS: 4 days    Brayton El PA-C 03/28/2012 8:50 AM

## 2012-03-28 NOTE — Progress Notes (Signed)
Name: Laurie Sparks MRN: [id number] DOB: [birth year]    LOS: 4  Referring Provider:  Maryan Puls Reason for Referral:  Shock, hypoxemia  PULMONARY / CRITICAL CARE MEDICINE   Brief patient description:  71 y/o female with CHF and CAD admitted 8/14 with chest pain developed hypotension, tachycardia and worsening hypoxemic respiratory failure likely in the setting of sepsis likely due to cholangitis vs. Less likely SBO/abdominal process.  Events Since Admission: 8/15 >> s/p CT scan abd, CBD obstruction with probable mass 8/16 >> IR guided biliary (CBD) drain  Current Status: Wakes to voice; phenylephrine weaning, urine output low  Vital Signs: Temp:  [97.4 F (36.3 C)-98.4 F (36.9 C)] 97.9 F (36.6 C) (08/17 0400) Pulse Rate:  [43-115] 62  (08/17 0630) Resp:  [14-30] 14  (08/17 0630) BP: (84-178)/(45-116) 145/65 mmHg (08/17 0630) SpO2:  [98 %-100 %] 100 % (08/17 0630) FiO2 (%):  [39.4 %-40.3 %] 40 % (08/17 0630) Weight:  [51.7 kg (113 lb 15.7 oz)] 51.7 kg (113 lb 15.7 oz) (08/17 0400)  Intake/Output Summary (Last 24 hours) at 03/28/12 0704 Last data filed at 03/28/12 0600  Gross per 24 hour  Intake 3068.88 ml  Output    665 ml  Net 2403.88 ml    Physical Examination: Gen: chronically ill appearing, intubated HEENT: NCAT, PERRL, EOMi, ETT in place PULM: Insp crackles in bases bilaterally CV: rrr, no mgr,  AB: BS infrequent, soft, less tender on my exam today Ext: warm, no edema, no clubbing, no cyanosis Derm: no rash or skin breakdown Neuro: sedated but arouses easily, follows commands   Principal Problem:  *Chest pain syndrome Active Problems:  CAD (coronary artery disease)  COPD (chronic obstructive pulmonary disease)  Anxiety  CHF (congestive heart failure)  Vitamin D deficiency disease  Suicidal ideation  UTI (lower urinary tract infection)  Hypotension  Fever  Septic shock  Acute respiratory failure with hypoxia   ASSESSMENT AND PLAN  PULMONARY  Lab  03/28/12 0331 03/26/12 0412 03/26/12 0305 03/26/12 0040  PHART 7.365 7.359 -- 7.363  PCO2ART 32.6* 35.5 -- 39.1  PO2ART 142.0* 68.0* -- 65.0*  HCO3 18.7* 20.0 -- 21.5  O2SAT 99.0 93.0 78.9 93.3   Ventilator Settings: Vent Mode:  [-] PRVC FiO2 (%):  [39.4 %-40.3 %] 40 % Set Rate:  [14 bmp] 14 bmp Vt Set:  [450 mL] 450 mL PEEP:  [5 cmH20] 5 cmH20 Plateau Pressure:  [16 cmH20-21 cmH20] 16 cmH20 CXR:  Bilateral effusions and pulm edema unchanged 8/17 ETT:  8/15 >>  A:  Acute on chronic hypoxemic respiratory failure due to pulmonary edema, effusions; COPD on 3 L O2 at home P:   -continue current PRVC settings today while on pressors -hopefully extubate 8/18  CARDIOVASCULAR  Lab 03/28/12 0415 03/27/12 0400 03/26/12 0130 03/26/12 0029 03/25/12 2200 03/25/12 1742 03/25/12 0927 03/25/12 0147  TROPONINI -- -- <0.30 <0.30 -- <0.30 <0.30 <0.30  LATICACIDVEN 1.7 2.5* 2.5* -- 3.5* -- -- --  PROBNP -- -- -- -- -- -- -- --   ECG:  Sinus tach, peaked T waves, no ST wave changes Lines:  03/26/12 R IJ CVL >> 03/25/12 2D TTE LVEF 60-65%, elevated PA pressure  A: Septic shock and currently volume overloaded based on physical exam and CXR; question mixed shock (cardiogenic?) given exam findings; CVP 8/17 am = 8-9 CAD but ruled out for MI, 03/25/12 TTE normal Lactate 2.5 >> 2.5 on 8/16 P:  -EGDT, goal CVP 8-12 -give 500cc bolus for oliguria now,  but try to limit aggressive resuscitation give volume overload on CXR -see ID section -wean neo, vaso   RENAL  Lab 03/28/12 0415 03/27/12 0400 03/26/12 0440 03/26/12 0029 03/25/12 0521  NA 127* 130* 139 137 138  K 3.8 3.8 -- -- --  CL 97 101 104 103 99  CO2 21 19 23 20 29   BUN 27* 24* 20 19 10   CREATININE 0.55 0.68 0.55 0.55 0.42*  CALCIUM 8.3* 7.4* 8.1* 7.6* 8.8  MG -- -- -- -- 1.6  PHOS -- -- -- -- --   Intake/Output      08/16 0701 - 08/17 0700 08/17 0701 - 08/18 0700   I.V. (mL/kg) 2493.4 (48.2)    Blood 190.5    NG/GT 60    IV  Piggyback 325    Total Intake(mL/kg) 3068.9 (59.4)    Urine (mL/kg/hr) 515 (0.4)    Drains 150    Stool     Total Output 665    Net +2403.9          Foley:  03/25/12  A:  Oliguria, BUN up and CVP 8 Hyponatremia P:   -500cc fluid bolus this AM -check urine osm  GASTROINTESTINAL  Lab 03/28/12 0415 03/27/12 0400 03/26/12 0029 03/25/12 0521  AST 66* 76* 117* 360*  ALT 119* 139* 216* 425*  ALKPHOS 170* 156* 143* 239*  BILITOT 4.5* 4.5* 3.4* 3.6*  PROT 4.7* 4.6* 4.8* 6.0  ALBUMIN 2.1* 2.0* 2.5* 3.5    A:  Elevated transaminases and alk phos, improving,due to cholangitis (hx chole), CBD obstruction Also with infrequent bowel sounds, history of multiple abdominal surgeries, ileus on CT scan and improved bowel sounds 8/17; note huge hiatal hernia P:   -start tube feeds 8/17 -definitive rx for mass at ampula would appear to be stenting or tube drainage; GI Dr Madilyn Fireman consulted 8/15. -s/p IR drain, eventually needs ERCP>> discuss timing with GI (prior to extubation?) -see ID  HEMATOLOGIC  Lab 03/28/12 0415 03/27/12 0400 03/26/12 1530 03/26/12 1215 03/26/12 0440 03/26/12 0029  HGB 9.8* 10.3* 10.3* -- 11.2* 10.3*  HCT 29.5* 31.9* 32.6* -- 35.0* 31.8*  PLT 23* 44* 64* -- 127* 109*  INR 1.12 1.22 -- 1.47 -- --  APTT -- -- -- 37 -- --   A:  Thrombocytopenia with no active bleeding; not on heparin, not in DIC, likely due to sepsis vs. meds (zosyn, vanc, pantop all possible) P:  -d/c asa -repeat PLT in citrate tube -monitor for bleeding  INFECTIOUS  Lab 03/28/12 0415 03/27/12 0400 03/26/12 1530 03/26/12 0440 03/26/12 0130 03/26/12 0029 03/25/12 2200  WBC 23.0* 26.3* 4.4 22.4* -- 15.0* --  PROCALCITON -- 39.94 -- -- 11.59 -- 9.25   Cultures: 03/25/12 blood >> GNR's + GPC's 03/25/12 urine >> NGTD >>  8/16 Biliary drain >> NGTD  Antibiotics: 8/14 zosyn (UTI, Cholangitis)>> 8/14 vanc (empiric sepsis) >>  A:  Septic shock, ddx UTI, cholangitis, bowel perf? P:   -antibiotics,  cultures as above -monitor biliary drain output   ENDOCRINE  Lab 03/28/12 0345 03/27/12 2358 03/27/12 2001 03/27/12 1601 03/27/12 0722  GLUCAP 121* 119* 131* 129* 147*   A:  Hyperglycemia; stress dose HC P:   -ICU hyperglycemia protocol -wean HC when off 2 pressors  NEUROLOGIC  A:  Suicidality on 8/14 P:   -psych to follow up after critical illness  BEST PRACTICE / DISPOSITION Level of Care:  ICU Primary Service:  PCCM Consultants:  psych Code Status:  full Diet:  npo  DVT Px:  lovenox off GI Px:  ppi Skin Integrity:  normal Social / Family:  Updated son and daughter at bedside 8/15  CC time 30 minutes  Yolonda Kida PCCM Pager: [phone] Cell: [phone] If no response, call [phone]

## 2012-03-28 NOTE — Progress Notes (Signed)
MD McQuaid aware of pt's low UOP and AM PLT count. Will monitor- no new orders Felipa Emory

## 2012-03-28 NOTE — Progress Notes (Signed)
Patient is S/P PTC with biliary drainage by IR.  Given the marked distortion of her gastroduodenal anatomy it may be impossible to get a scope into position to do an ERCP/biopsy or evaluate further endoscopically. I would lean toward IRs suggestion of obtaining bx since they already have access to the biliary tree.

## 2012-03-28 NOTE — Progress Notes (Signed)
Nutrition Follow-up  Intervention:   1. Initiate Vital 1.2 @ 15 ml/hr via OGT and increase by 10 ml every 12 hours to goal rate of 35 ml/hr.  At goal rate, tube feeding regimen will provide 1008 kcal, 64 grams of protein, and 681 ml of H2O.  2. Monitor magnesium, potassium, and phosphorus daily for at least 3 days, MD to replete as needed, as pt is at moderate risk for refeeding syndrome given pt thin/ill appearing, low body weight at admission with unknown nutrition hx.  3. If no IV fluids, pt will require additional free water flushes to meet hydration needs.  4. Daily multivitamin as this EN regimen does not meet 100% RDI's for vitamins and minerals  5. RD will continue to follow       Assessment:   RD consulted for initiation and management of TF. Pt remains intubated, possible extubation on 8/18 per notes. S/p IR biliary drain catheter placement on 8/16.   Diet Order:  NPO  Meds: Scheduled Meds:   . albuterol  4 puff Inhalation Q4H  . antiseptic oral rinse  15 mL Mouth Rinse BID  . antiseptic oral rinse  15 mL Mouth Rinse QID  . aspirin EC  81 mg Oral Daily  . chlorhexidine  15 mL Mouth Rinse BID  . docusate sodium  100 mg Oral BID  . hydrocortisone sod succinate (SOLU-CORTEF) injection  50 mg Intravenous Q6H  . insulin aspart  0-3 Units Subcutaneous Q4H  . insulin aspart  0-9 Units Subcutaneous TID WC  . LORazepam  1 mg Intravenous Once  . midazolam      . pantoprazole sodium  40 mg Oral BID AC  . piperacillin-tazobactam (ZOSYN)  IV  3.375 g Intravenous Q8H  . sodium chloride  500 mL Intravenous Once  . sodium chloride  3 mL Intravenous Q12H  . vancomycin  1,000 mg Intravenous Q24H  . DISCONTD: nitroGLYCERIN  0.1 mg Transdermal Daily  . DISCONTD: vancomycin  750 mg Intravenous Q24H   Continuous Infusions:   . sodium chloride 50 mL/hr at 03/27/12 0400  . dextrose    . fentaNYL infusion INTRAVENOUS 100 mcg/hr (03/28/12 0145)  . norepinephrine (LEVOPHED) Adult infusion  Stopped (03/27/12 1854)  . phenylephrine (NEO-SYNEPHRINE) Adult infusion 40 mcg/min (03/28/12 0400)  . vasopressin (PITRESSIN) infusion - *FOR SHOCK* 0.03 Units/min (03/27/12 1012)   PRN Meds:.sodium chloride, acetaminophen, acetaminophen, albuterol, albuterol, dextrose, DOBUTamine, fentaNYL, fentaNYL, iohexol, ipratropium, midazolam, midazolam, morphine injection, nitroGLYCERIN, norepinephrine (LEVOPHED) Adult infusion, ondansetron, ondansetron (ZOFRAN) IV, sodium chloride, sodium chloride  Labs:  CMP     Component Value Date/Time   NA 127* 03/28/2012 0415   K 3.8 03/28/2012 0415   CL 97 03/28/2012 0415   CO2 21 03/28/2012 0415   GLUCOSE 146* 03/28/2012 0415   BUN 27* 03/28/2012 0415   CREATININE 0.55 03/28/2012 0415   CALCIUM 8.3* 03/28/2012 0415   PROT 4.7* 03/28/2012 0415   ALBUMIN 2.1* 03/28/2012 0415   AST 66* 03/28/2012 0415   ALT 119* 03/28/2012 0415   ALKPHOS 170* 03/28/2012 0415   BILITOT 4.5* 03/28/2012 0415   GFRNONAA >90 03/28/2012 0415   GFRAA >90 03/28/2012 0415   Sodium  Date/Time Value Range Status  03/28/2012  4:15 AM 127* 135 - 145 mEq/L Final  03/27/2012  4:00 AM 130* 135 - 145 mEq/L Final     DELTA CHECK NOTED  03/26/2012  4:40 AM 139  135 - 145 mEq/L Final    Potassium  Date/Time Value Range Status  03/28/2012  4:15 AM 3.8  3.5 - 5.1 mEq/L Final  03/27/2012  4:00 AM 3.8  3.5 - 5.1 mEq/L Final     DELTA CHECK NOTED  03/26/2012  4:40 AM 3.1* 3.5 - 5.1 mEq/L Final    No results found for this basename: phos    Magnesium  Date/Time Value Range Status  03/25/2012  5:21 AM 1.6  1.5 - 2.5 mg/dL Final     Intake/Output Summary (Last 24 hours) at 03/28/12 0841 Last data filed at 03/28/12 0700  Gross per 24 hour  Intake 3052.58 ml  Output    655 ml  Net 2397.58 ml   Weight Status:  113 lbs, up 9 lbs from yesterday and 15-16 lbs from admission weight. Some weight gain from fluids, and possible inaccuracy of weight.   Temp (24hrs), Avg:98 F (36.7 C), Min:97.4 F (36.3  C), Max:98.4 F (36.9 C) MVe:6.2-9.3 range, trending about 6.5  Re-estimated needs:  1038 kcal, 60-70 gm protein   Nutrition Dx:  Inadequate oral intake r/t mechanical ventilation AEB pt NPO, ongoing   Goal:  Enteral nutrition initiation, met New Goal: EN to provide >90% of estimated nutrition needs, unmet currently   Monitor:  EN initiation/rate/tolerance, weight, labs   Clarene Duke RD, LDN Pager [phone] After Hours pager [phone]

## 2012-03-28 NOTE — Progress Notes (Signed)
Thrombocytopenia   Most likely secondary to acute illness and medication related bone marrow suppression. We will stop Zosyn and start Flagyl and Ceftazidime. Microbiology pos for [facility] for E Coli and a un speciated GPC (possible enterococcus given the GI tract most likely source). We will obtain a VRE rectal screen.

## 2012-03-28 NOTE — Progress Notes (Signed)
Progress note following consultatiofn Pt is found intubated and sedated.  Note for common bile duct obstruction and placement of drain is found.  Pt has more steady VS today, this afternoon, 03/27/12 Will follow pt Laurie Sparks J. Ferol Luz, MD Psychiatrist  03/28/2012 1:57 AM

## 2012-03-29 ENCOUNTER — Inpatient Hospital Stay (HOSPITAL_COMMUNITY): Payer: Medicare Other

## 2012-03-29 LAB — CBC WITH DIFFERENTIAL/PLATELET
Basophils Absolute: 0 10*3/uL (ref 0.0–0.1)
Eosinophils Absolute: 0 10*3/uL (ref 0.0–0.7)
Lymphocytes Relative: 4 % — ABNORMAL LOW (ref 12–46)
MCH: 30.8 pg (ref 26.0–34.0)
MCHC: 34 g/dL (ref 30.0–36.0)
Monocytes Absolute: 1 10*3/uL (ref 0.1–1.0)
Neutrophils Relative %: 89 % — ABNORMAL HIGH (ref 43–77)
Platelets: 23 10*3/uL — CL (ref 150–400)
RDW: 13.4 % (ref 11.5–15.5)

## 2012-03-29 LAB — BLOOD GAS, ARTERIAL
Acid-base deficit: 3.5 mmol/L — ABNORMAL HIGH (ref 0.0–2.0)
Bicarbonate: 20.3 mEq/L (ref 20.0–24.0)
Drawn by: 31276
FIO2: 0.3 %
O2 Saturation: 98.2 %
RATE: 14 resp/min
pCO2 arterial: 32.4 mmHg — ABNORMAL LOW (ref 35.0–45.0)
pO2, Arterial: 95.5 mmHg (ref 80.0–100.0)

## 2012-03-29 LAB — GLUCOSE, CAPILLARY
Glucose-Capillary: 158 mg/dL — ABNORMAL HIGH (ref 70–99)
Glucose-Capillary: 191 mg/dL — ABNORMAL HIGH (ref 70–99)

## 2012-03-29 LAB — BASIC METABOLIC PANEL
BUN: 35 mg/dL — ABNORMAL HIGH (ref 6–23)
Chloride: 100 mEq/L (ref 96–112)
GFR calc Af Amer: >90 mL/min (ref >90)
GFR calc non Af Amer: >90 mL/min (ref >90)
Glucose, Bld: 178 mg/dL — ABNORMAL HIGH (ref 70–99)
Potassium: 3.6 mEq/L (ref 3.5–5.1)
Sodium: 131 mEq/L — ABNORMAL LOW (ref 135–145)

## 2012-03-29 MED ORDER — INSULIN GLARGINE 100 UNIT/ML ~~LOC~~ SOLN
5.0000 [IU] | Freq: Every day | SUBCUTANEOUS | Status: DC
  Administered 2012-03-30 – 2012-04-02 (×5): 5 [IU] via SUBCUTANEOUS

## 2012-03-29 MED ORDER — SODIUM CHLORIDE 0.9 % IJ SOLN
10.0000 mL | Freq: Two times a day (BID) | INTRAMUSCULAR | Status: DC
  Administered 2012-03-30 – 2012-04-02 (×5): 10 mL
  Administered 2012-04-02 – 2012-04-04 (×4): 30 mL

## 2012-03-29 MED ORDER — INSULIN ASPART 100 UNIT/ML ~~LOC~~ SOLN
0.0000 [IU] | SUBCUTANEOUS | Status: DC
  Administered 2012-03-30 (×2): 3 [IU] via SUBCUTANEOUS
  Administered 2012-03-30: 2 [IU] via SUBCUTANEOUS
  Administered 2012-03-30 (×2): 3 [IU] via SUBCUTANEOUS
  Administered 2012-03-31: 2 [IU] via SUBCUTANEOUS
  Administered 2012-03-31: 3 [IU] via SUBCUTANEOUS
  Administered 2012-04-01 – 2012-04-03 (×9): 2 [IU] via SUBCUTANEOUS

## 2012-03-29 MED ORDER — DOCUSATE SODIUM 50 MG/5ML PO LIQD
100.0000 mg | Freq: Two times a day (BID) | ORAL | Status: DC
  Administered 2012-03-29 – 2012-04-02 (×5): 100 mg via ORAL
  Filled 2012-03-29 (×11): qty 10

## 2012-03-29 MED ORDER — SODIUM CHLORIDE 0.9 % IJ SOLN
10.0000 mL | INTRAMUSCULAR | Status: DC | PRN
Start: 2012-03-29 — End: 2012-04-04
  Administered 2012-03-30: 10 mL

## 2012-03-29 NOTE — Progress Notes (Signed)
The case was discussed this morning with Dr. Kendrick Fries from the critical care team. Given the marked distortion of her gastro- duodenal anatomy we will plan to have interventional radiology do any further interventions necessary for biliary drainage and biopsy of biliary or ampullary abnormalities.  Call us again if we can be of any further assistance from a GI standpoint.

## 2012-03-29 NOTE — Progress Notes (Addendum)
Name: Laurie Sparks MRN: [id number] DOB: [birth year]    LOS: 5  Referring Provider:  Maryan Puls Reason for Referral:  Shock, hypoxemia  PULMONARY / CRITICAL CARE MEDICINE   Brief patient description:  71 y/o female with CHF and CAD admitted 8/14 with chest pain developed hypotension, tachycardia and worsening hypoxemic respiratory failure likely in the setting of sepsis likely due to cholangitis vs. Less likely SBO/abdominal process.  Events Since Admission: 8/15 >> s/p CT scan abd, CBD obstruction with probable mass 8/16 >> IR guided biliary (CBD) drain  Current Status: Phenylephrine off, opens eyes to voice  Vital Signs: Temp:  [97.6 F (36.4 C)-98.6 F (37 C)] 98.6 F (37 C) (08/18 0400) Pulse Rate:  [58-82] 70  (08/18 0700) Resp:  [14-23] 14  (08/18 0700) BP: (86-170)/(48-88) 131/63 mmHg (08/18 0700) SpO2:  [97 %-100 %] 98 % (08/18 0700) FiO2 (%):  [29.6 %-99.1 %] 30.1 % (08/18 0700) Weight:  [53.9 kg (118 lb 13.3 oz)] 53.9 kg (118 lb 13.3 oz) (08/18 0542)  Intake/Output Summary (Last 24 hours) at 03/29/12 0703 Last data filed at 03/29/12 0600  Gross per 24 hour  Intake 2874.71 ml  Output    857 ml  Net 2017.71 ml    Physical Examination: Gen: chronically ill appearing, intubated HEENT: NCAT, PERRL, EOMi, ETT in place PULM: Insp crackles in bases bilaterally CV: rrr, no mgr,  AB: BS infrequent, soft, less tender on my exam today Ext: warm, no edema, no clubbing, no cyanosis Derm: no rash or skin breakdown Neuro: sedated but arouses easily, follows commands   Principal Problem:  *Chest pain syndrome Active Problems:  CAD (coronary artery disease)  COPD (chronic obstructive pulmonary disease)  Anxiety  CHF (congestive heart failure)  Vitamin D deficiency disease  Suicidal ideation  UTI (lower urinary tract infection)  Hypotension  Fever  Septic shock  Acute respiratory failure with hypoxia   ASSESSMENT AND PLAN  PULMONARY  Lab 03/29/12 0339 03/28/12  0331 03/26/12 0412 03/26/12 0305 03/26/12 0040  PHART 7.414 7.365 7.359 -- 7.363  PCO2ART 32.4* 32.6* 35.5 -- 39.1  PO2ART 95.5 142.0* 68.0* -- 65.0*  HCO3 20.3 18.7* 20.0 -- 21.5  O2SAT 98.2 99.0 93.0 78.9 93.3   Ventilator Settings: Vent Mode:  [-] PRVC FiO2 (%):  [29.6 %-99.1 %] 30.1 % Set Rate:  [14 bmp] 14 bmp Vt Set:  [450 mL] 450 mL PEEP:  [5 cmH20] 5 cmH20 Plateau Pressure:  [19 cmH20-20 cmH20] 19 cmH20 CXR:  Bilateral effusions and pulm edema unchanged 8/17 ETT:  8/15 >>  A:  Acute on chronic hypoxemic respiratory failure due to pulmonary edema, effusions; COPD on 3 L O2 at home P:   -full vent support until biliary stenting and brushing can be performed -try to diurese as pressure allows -ho  CARDIOVASCULAR  Lab 03/28/12 0415 03/27/12 0400 03/26/12 0130 03/26/12 0029 03/25/12 2200 03/25/12 1742 03/25/12 0927 03/25/12 0147  TROPONINI -- -- <0.30 <0.30 -- <0.30 <0.30 <0.30  LATICACIDVEN 1.7 2.5* 2.5* -- 3.5* -- -- --  PROBNP -- -- -- -- -- -- -- --   ECG:  Sinus tach, peaked T waves, no ST wave changes Lines:  03/26/12 R IJ CVL >> 03/25/12 2D TTE LVEF 60-65%, elevated PA pressure  A: Septic shock and currently volume overloaded based on physical exam and CXR; question mixed shock (cardiogenic?) given exam findings; CVP 8/17 am = 8-9 CAD but ruled out for MI, 03/25/12 TTE normal Lactate 2.5 >> 2.5 on 8/16  P:  -EGDT complete, try to wean pressors today -see ID section -wean neo, vaso   RENAL  Lab 03/29/12 0345 03/28/12 0415 03/27/12 0400 03/26/12 0440 03/26/12 0029 03/25/12 0521  NA 131* 127* 130* 139 137 --  K 3.6 3.8 -- -- -- --  CL 100 97 101 104 103 --  CO2 22 21 19 23 20  --  BUN 35* 27* 24* 20 19 --  CREATININE 0.56 0.55 0.68 0.55 0.55 --  CALCIUM 8.5 8.3* 7.4* 8.1* 7.6* --  MG -- -- -- -- -- 1.6  PHOS -- -- -- -- -- --   Intake/Output      08/17 0701 - 08/18 0700 08/18 0701 - 08/19 0700   I.V. (mL/kg) 1569.7 (29.1)    Blood     NG/GT 525      IV Piggyback 887.5    Total Intake(mL/kg) 2982.2 (55.3)    Urine (mL/kg/hr) 555 (0.4)    Emesis/NG output 100    Drains 200    Stool 2    Total Output 857    Net +2125.2          Foley:  03/25/12  A:  Oliguria, BUN up and CVP 8 Hyponatremia,  SIADH given urine OSM 502; improving P:   -renal function stable, limit further fluid boluses given volume overload   GASTROINTESTINAL  Lab 03/28/12 0415 03/27/12 0400 03/26/12 0029 03/25/12 0521  AST 66* 76* 117* 360*  ALT 119* 139* 216* 425*  ALKPHOS 170* 156* 143* 239*  BILITOT 4.5* 4.5* 3.4* 3.6*  PROT 4.7* 4.6* 4.8* 6.0  ALBUMIN 2.1* 2.0* 2.5* 3.5    A:  Elevated transaminases and alk phos, improving,due to cholangitis (hx chole), CBD obstruction Also with infrequent bowel sounds, history of multiple abdominal surgeries, ileus on CT scan and improved bowel sounds 8/17; note huge hiatal hernia P:   -start tube feeds 8/17 -s/p IR drain, eventually needs ampullary biopsy; GI recommends discussing with IR because her anatomy would make ERCP technically difficult -likely 8/19 or 8/20 -see ID  HEMATOLOGIC  Lab 03/29/12 0345 03/28/12 2100 03/28/12 0940 03/28/12 0415 03/27/12 0400 03/26/12 1530 03/26/12 1215 03/26/12 0440  HGB 8.9* -- -- 9.8* 10.3* 10.3* -- 11.2*  HCT 26.2* -- -- 29.5* 31.9* 32.6* -- 35.0*  PLT 23* 20* 14* 23* 44* -- -- --  INR -- 1.07 -- 1.12 1.22 -- 1.47 --  APTT -- 30 -- -- -- -- 37 --   A:  Thrombocytopenia with no active bleeding; not on heparin, not in DIC, likely due to sepsis vs. meds (zosyn, vanc, pantop all possible) Held Zosyn 8/17 PM P:  -stop vanc 8/19 if GPC's in blood not enterococcus -monitor for bleeding  INFECTIOUS  Lab 03/29/12 0345 03/28/12 0415 03/27/12 0400 03/26/12 1530 03/26/12 0440 03/26/12 0130 03/25/12 2200  WBC 14.3* 23.0* 26.3* 4.4 22.4* -- --  PROCALCITON -- -- 39.94 -- -- 11.59 9.25   Cultures: 03/25/12 blood >> ecoli + GPC's 03/25/12 urine >> NGTD >>  8/16 Biliary drain >>  NGTD  Antibiotics: 8/14 zosyn (UTI, Cholangitis)>>8/17 8/14 vanc (GPC's in blood) >> 8/17 ceftaz (ecoli bacteremia) >> 8/17 flagyl (cholangitis, bacteremia)>>  A:  Septic shock, ddx UTI, cholangitis, bowel perf? P:   -cont ceftaz/flagyl -cont vanc until GPC's in blood culture speciated -may need to stop vanc sooner, see Heme -monitor biliary drain output   ENDOCRINE  Lab 03/29/12 0404 03/29/12 0005 03/28/12 1958 03/28/12 1540 03/28/12 1146  GLUCAP 175* 181* 135* 112* 81  A:  Hyperglycemia; stress dose HC P:   -ICU hyperglycemia protocol -wean HC when off 2 pressors  NEUROLOGIC  A:  Suicidality on 8/14 P:   -psych to follow up after critical illness  BEST PRACTICE / DISPOSITION Level of Care:  ICU Primary Service:  PCCM Consultants:  psych Code Status:  full Diet:  npo DVT Px:  lovenox off GI Px:  ppi Skin Integrity:  normal Social / Family:  Updated son and daughter at bedside 8/15  CC time 30 minutes  Yolonda Kida PCCM Pager: [phone] Cell: [phone] If no response, call [phone]

## 2012-03-29 NOTE — Progress Notes (Signed)
eLink Physician-Brief Progress Note Patient Name: Laurie Sparks DOB: 11-05-1940 MRN: [id number]  Date of Service  03/29/2012   HPI/Events of Note   Rising glu on tf  eICU Interventions  Add lant, ssi to Minette Headland J. 03/29/2012, 11:54 PM

## 2012-03-29 NOTE — Progress Notes (Signed)
Subjective: Pt remains intubated but awakens and can follow commands.  Objective: Physical Exam: BP 131/63  Pulse 70  Temp 98.1 F (36.7 C) (Oral)  Resp 14  Ht 5\' 1"  (1.549 m)  Wt 118 lb 13.3 oz (53.9 kg)  BMI 22.45 kg/m2  SpO2 98% LUQ drain intact, site clean, no retraction Output dark bilious with some particulate sludge, 200cc recorded yesterday  Labs: CBC  Basename 03/29/12 0345 03/28/12 2100 03/28/12 0415  WBC 14.3* -- 23.0*  HGB 8.9* -- 9.8*  HCT 26.2* -- 29.5*  PLT 23* 20* --   BMET  Basename 03/29/12 0345 03/28/12 0415  NA 131* 127*  K 3.6 3.8  CL 100 97  CO2 22 21  GLUCOSE 178* 146*  BUN 35* 27*  CREATININE 0.56 0.55  CALCIUM 8.5 8.3*   LFT  Basename 03/28/12 0415  PROT 4.7*  ALBUMIN 2.1*  AST 66*  ALT 119*  ALKPHOS 170*  BILITOT 4.5*  BILIDIR 3.4*  IBILI 1.1*  LIPASE --   PT/INR  Basename 03/28/12 2100 03/28/12 0415  LABPROT 14.1 14.6  INR 1.07 1.12     Studies/Results: Ir Perc Biliary Drain-ext Only  03/27/2012  *RADIOLOGY REPORT*  IR PERCUTANEOUS TRANSHEPATIC CHOLANGIOGRAM AND PERCUTANEOUS BILIARY DRAIN PLACEMENT  Date: 03/27/2012  Clinical History: 71 year old female with severe sepsis in the setting of obstructed cholangitis. By CT scan, she has an obstructing mass of uncertain etiology in the distal common bile duct.  The left and right intrahepatic ductal system appeared to communicate to.  We will proceed with placement of a left-sided approach percutaneous biliary drain.  If necessary, bilateral biliary drains will be placed.  Procedures Performed: 1. Ultrasound-guided puncture of a peripheral left bile duct 2.  Percutaneous transhepatic cholangiogram 3.  Placement of a percutaneous transhepatic biliary drain  Interventional Radiologist:  Sterling Big, MD  Sedation: Moderate (conscious) sedation was used.  2 mg Versed, 50 mcg Fentanyl were administered intravenously.  The patient's vital signs were monitored continuously by  radiology nursing throughout the procedure.  Sedation Time: 53 minutes  Fluoroscopy time: 23.4  Contrast volume: 60 ml Omnipaque-300 administered into the biliary system  PROCEDURE/FINDINGS:   Informed consent was obtained from the patient following explanation of the procedure, risks, benefits and alternatives. The patient understands, agrees and consents for the procedure. All questions were addressed. A time out was performed.  Maximal barrier sterile technique utilized including caps, mask, sterile gowns, sterile gloves, large sterile drape, hand hygiene, and betadine skin prep.  The mid epigastric region to the left of midline was interrogated with ultrasound.  Multiple dilated left sided intrahepatic bile duct were identified.  A peripheral (3rd order) bile duct was identified.  Local anesthesia was achieved with infiltration of 1% lidocaine.  Under direct sonographic guidance, the peripheral biliary radicle was punctured with a 21-gauge micropuncture needle. Images obtained stored for the medical record.  An 0.018-inch wire was advanced into the central bile ducts followed by the Accustick sheath.  Hand injection of contrast material through the sheath was performed to retain a percutaneous transhepatic cholangiogram.  The patient's anatomy is markedly distorted secondary to her large hiatal hernia and partial herniation of the stomach, small bowel and pancreas into the chest. Her common bile duct is markedly dilated  and ascends toward the left lower chest.  There is diffuse bilateral intrahepatic biliary ductal dilatation. The distal common bile duct is completely obstructed.  A 4-French Glidecatheter and a glide wire were used to navigate through the  l left biliary ductal system into the common bile duct. The guide wire was then exchanged for the Amplatz wire.  A 8-French percutaneous biliary drain was then advanced over the wire and positioned in the distal common bile duct just proximal to the  obstruction under fluoroscopic guidance.  A sample of green bile was aspirated and sent to the lab for culture.  The tube was then connected to a bag for gravity drainage and secured to the skin using 2-0 Prolene suture.  There is no immediate complication, the patient tolerated the procedure well.  She was returned to the intensive care unit in unchanged condition.  IMPRESSION:  1.  Percutaneous transhepatic cholangiogram demonstrates complete obstruction of the distal common bile duct with marked common bile duct and intrahepatic ductal dilatation.  The obstruction is relatively smooth.  Differential considerations include obstructing neoplasm (given location ampullary, duodenal, pancreatic and less likely cholangiocarcinoma are possible primaries), and potentially benign biliary stricture.  2.  Distorted common bile duct anatomy secondary to large hiatal hernia and upward herniation of the duodenum, pancreas and distal common bile duct.  3.  Successful placement of an 8 [place] external percutaneous transhepatic biliary drain. Maintain drain to gravity drainage for now.  Once the patient has stabilized, and decompressed she should return interventional radiology for attempted crossing of the obstructing mass/stricture and internalization of the tube.  4. Once stabilized, further evaluation with MRI and MRCP may be useful to evaluate for underlying mass.  Given the patient's difficult anatomy, if ERCP is not possible tissue diagnosis can be obtained by percutaneous transhepatic biopsy at the time of internalization of the tube.  Signed,  Sterling Big, MD Vascular & Interventional Radiologist [facility] Radiology  Original Report Authenticated By: Vilma Prader   Ir Ptc  03/27/2012  *RADIOLOGY REPORT*  IR PERCUTANEOUS TRANSHEPATIC CHOLANGIOGRAM AND PERCUTANEOUS BILIARY DRAIN PLACEMENT  Date: 03/27/2012  Clinical History: 71 year old female with severe sepsis in the setting of obstructed cholangitis. By CT scan,  she has an obstructing mass of uncertain etiology in the distal common bile duct.  The left and right intrahepatic ductal system appeared to communicate to.  We will proceed with placement of a left-sided approach percutaneous biliary drain.  If necessary, bilateral biliary drains will be placed.  Procedures Performed: 1. Ultrasound-guided puncture of a peripheral left bile duct 2.  Percutaneous transhepatic cholangiogram 3.  Placement of a percutaneous transhepatic biliary drain  Interventional Radiologist:  Sterling Big, MD  Sedation: Moderate (conscious) sedation was used.  2 mg Versed, 50 mcg Fentanyl were administered intravenously.  The patient's vital signs were monitored continuously by radiology nursing throughout the procedure.  Sedation Time: 53 minutes  Fluoroscopy time: 23.4  Contrast volume: 60 ml Omnipaque-300 administered into the biliary system  PROCEDURE/FINDINGS:   Informed consent was obtained from the patient following explanation of the procedure, risks, benefits and alternatives. The patient understands, agrees and consents for the procedure. All questions were addressed. A time out was performed.  Maximal barrier sterile technique utilized including caps, mask, sterile gowns, sterile gloves, large sterile drape, hand hygiene, and betadine skin prep.  The mid epigastric region to the left of midline was interrogated with ultrasound.  Multiple dilated left sided intrahepatic bile duct were identified.  A peripheral (3rd order) bile duct was identified.  Local anesthesia was achieved with infiltration of 1% lidocaine.  Under direct sonographic guidance, the peripheral biliary radicle was punctured with a 21-gauge micropuncture needle. Images obtained stored for the medical record.  An 0.018-inch wire was advanced into the central bile ducts followed by the Accustick sheath.  Hand injection of contrast material through the sheath was performed to retain a percutaneous transhepatic  cholangiogram.  The patient's anatomy is markedly distorted secondary to her large hiatal hernia and partial herniation of the stomach, small bowel and pancreas into the chest. Her common bile duct is markedly dilated  and ascends toward the left lower chest.  There is diffuse bilateral intrahepatic biliary ductal dilatation. The distal common bile duct is completely obstructed.  A 4-French Glidecatheter and a glide wire were used to navigate through the l left biliary ductal system into the common bile duct. The guide wire was then exchanged for the Amplatz wire.  A 8-French percutaneous biliary drain was then advanced over the wire and positioned in the distal common bile duct just proximal to the obstruction under fluoroscopic guidance.  A sample of green bile was aspirated and sent to the lab for culture.  The tube was then connected to a bag for gravity drainage and secured to the skin using 2-0 Prolene suture.  There is no immediate complication, the patient tolerated the procedure well.  She was returned to the intensive care unit in unchanged condition.  IMPRESSION:  1.  Percutaneous transhepatic cholangiogram demonstrates complete obstruction of the distal common bile duct with marked common bile duct and intrahepatic ductal dilatation.  The obstruction is relatively smooth.  Differential considerations include obstructing neoplasm (given location ampullary, duodenal, pancreatic and less likely cholangiocarcinoma are possible primaries), and potentially benign biliary stricture.  2.  Distorted common bile duct anatomy secondary to large hiatal hernia and upward herniation of the duodenum, pancreas and distal common bile duct.  3.  Successful placement of an 8 [place] external percutaneous transhepatic biliary drain. Maintain drain to gravity drainage for now.  Once the patient has stabilized, and decompressed she should return interventional radiology for attempted crossing of the obstructing mass/stricture  and internalization of the tube.  4. Once stabilized, further evaluation with MRI and MRCP may be useful to evaluate for underlying mass.  Given the patient's difficult anatomy, if ERCP is not possible tissue diagnosis can be obtained by percutaneous transhepatic biopsy at the time of internalization of the tube.  Signed,  Sterling Big, MD Vascular & Interventional Radiologist [facility] Radiology  Original Report Authenticated By: Vilma Prader   Ir Fluoro Guide Ndl Plmt / Bx  03/27/2012  *RADIOLOGY REPORT*  IR PERCUTANEOUS TRANSHEPATIC CHOLANGIOGRAM AND PERCUTANEOUS BILIARY DRAIN PLACEMENT  Date: 03/27/2012  Clinical History: 71 year old female with severe sepsis in the setting of obstructed cholangitis. By CT scan, she has an obstructing mass of uncertain etiology in the distal common bile duct.  The left and right intrahepatic ductal system appeared to communicate to.  We will proceed with placement of a left-sided approach percutaneous biliary drain.  If necessary, bilateral biliary drains will be placed.  Procedures Performed: 1. Ultrasound-guided puncture of a peripheral left bile duct 2.  Percutaneous transhepatic cholangiogram 3.  Placement of a percutaneous transhepatic biliary drain  Interventional Radiologist:  Sterling Big, MD  Sedation: Moderate (conscious) sedation was used.  2 mg Versed, 50 mcg Fentanyl were administered intravenously.  The patient's vital signs were monitored continuously by radiology nursing throughout the procedure.  Sedation Time: 53 minutes  Fluoroscopy time: 23.4  Contrast volume: 60 ml Omnipaque-300 administered into the biliary system  PROCEDURE/FINDINGS:   Informed consent was obtained from the patient following explanation of the procedure, risks, benefits  and alternatives. The patient understands, agrees and consents for the procedure. All questions were addressed. A time out was performed.  Maximal barrier sterile technique utilized including caps, mask, sterile  gowns, sterile gloves, large sterile drape, hand hygiene, and betadine skin prep.  The mid epigastric region to the left of midline was interrogated with ultrasound.  Multiple dilated left sided intrahepatic bile duct were identified.  A peripheral (3rd order) bile duct was identified.  Local anesthesia was achieved with infiltration of 1% lidocaine.  Under direct sonographic guidance, the peripheral biliary radicle was punctured with a 21-gauge micropuncture needle. Images obtained stored for the medical record.  An 0.018-inch wire was advanced into the central bile ducts followed by the Accustick sheath.  Hand injection of contrast material through the sheath was performed to retain a percutaneous transhepatic cholangiogram.  The patient's anatomy is markedly distorted secondary to her large hiatal hernia and partial herniation of the stomach, small bowel and pancreas into the chest. Her common bile duct is markedly dilated  and ascends toward the left lower chest.  There is diffuse bilateral intrahepatic biliary ductal dilatation. The distal common bile duct is completely obstructed.  A 4-French Glidecatheter and a glide wire were used to navigate through the l left biliary ductal system into the common bile duct. The guide wire was then exchanged for the Amplatz wire.  A 8-French percutaneous biliary drain was then advanced over the wire and positioned in the distal common bile duct just proximal to the obstruction under fluoroscopic guidance.  A sample of green bile was aspirated and sent to the lab for culture.  The tube was then connected to a bag for gravity drainage and secured to the skin using 2-0 Prolene suture.  There is no immediate complication, the patient tolerated the procedure well.  She was returned to the intensive care unit in unchanged condition.  IMPRESSION:  1.  Percutaneous transhepatic cholangiogram demonstrates complete obstruction of the distal common bile duct with marked common bile  duct and intrahepatic ductal dilatation.  The obstruction is relatively smooth.  Differential considerations include obstructing neoplasm (given location ampullary, duodenal, pancreatic and less likely cholangiocarcinoma are possible primaries), and potentially benign biliary stricture.  2.  Distorted common bile duct anatomy secondary to large hiatal hernia and upward herniation of the duodenum, pancreas and distal common bile duct.  3.  Successful placement of an 8 [place] external percutaneous transhepatic biliary drain. Maintain drain to gravity drainage for now.  Once the patient has stabilized, and decompressed she should return interventional radiology for attempted crossing of the obstructing mass/stricture and internalization of the tube.  4. Once stabilized, further evaluation with MRI and MRCP may be useful to evaluate for underlying mass.  Given the patient's difficult anatomy, if ERCP is not possible tissue diagnosis can be obtained by percutaneous transhepatic biopsy at the time of internalization of the tube.  Signed,  Sterling Big, MD Vascular & Interventional Radiologist [facility] Radiology  Original Report Authenticated By: Alvino Blood Chest Port 1 View  03/29/2012  *RADIOLOGY REPORT*  Clinical Data: Endotracheal tube check  PORTABLE CHEST - 1 VIEW  Comparison: Yesterday  Findings: Endotracheal tube tip is 1.7 cm from the carina.  Stable NG tube and epigastric biliary drain.  Right internal jugular vein central venous catheter stable.  Bilateral pleural effusions and bibasilar airspace disease stable.  No pneumothorax.  There is continued shift of the mediastinum to the right worrisome for right lower lobe volume loss.  IMPRESSION: Endotracheal tube 1.7 cm from the carina.  Otherwise stable exam.  Original Report Authenticated By: Donavan Burnet, M.D.   Dg Chest Port 1 View  03/28/2012  *RADIOLOGY REPORT*  Clinical Data: Respiratory difficulty  PORTABLE CHEST - 1 VIEW  Comparison:  03/26/2012  Findings: Cardiomegaly.  Endotracheal tube and right internal jugular vein central venous catheter stable.  NG tube tip is in the body of the stomach.  Epigastric biliary drain placed.  Bilateral pleural effusions and bilateral airspace disease not significantly changed.  No pneumothorax.  IMPRESSION: NG tube placed.  Stable bilateral airspace disease and bilateral pleural effusions.  Original Report Authenticated By: Donavan Burnet, M.D.    Assessment/Plan: S/p Left percutaneous transhepatic cholangiogram and placement of a percutaneous biliary drain. Drain enters via segment three and terminates in the distal CBD. Bilious output No LFTs today Cont to follow, planning for doing brushings/bx once pt stable, extubated, etc..   LOS: 5 days    Brayton El PA-C 03/29/2012 8:34 AM

## 2012-03-29 NOTE — Progress Notes (Signed)
ANTIBIOTIC CONSULT NOTE - Follow-Up  Pharmacy Consult for Vancomycin   Indication: rule out sepsis/UTI/abdominal process  Allergies  Allergen Reactions  . Ambien (Zolpidem Tartrate) Other (See Comments)    Hallucinate   . Other     Base metal and something else    Labs:  Basename 03/29/12 0345 03/28/12 2100 03/28/12 0940 03/28/12 0415 03/27/12 0400  WBC 14.3* -- -- 23.0* 26.3*  HGB 8.9* -- -- 9.8* 10.3*  PLT 23* 20* 14* -- --  LABCREA -- -- -- -- --  CREATININE 0.56 -- -- 0.55 0.68   Estimated Creatinine Clearance: 48.7 ml/min (by C-G formula based on Cr of 0.56). No results found for this basename: VANCOTROUGH:2,VANCOPEAK:2,VANCORANDOM:2,GENTTROUGH:2,GENTPEAK:2,GENTRANDOM:2,TOBRATROUGH:2,TOBRAPEAK:2,TOBRARND:2,AMIKACINPEAK:2,AMIKACINTROU:2,AMIKACIN:2, in the last 72 hours   Microbiology: Recent Results (from the past 720 hour(s))  URINE CULTURE     Status: Normal   Collection Time   03/25/12  3:28 PM      Component Value Range Status Comment   Specimen Description URINE, RANDOM   Final    Special Requests NONE   Final    Culture  Setup Time 03/25/2012 15:54   Final    Colony Count 65,000 COLONIES/ML   Final    Culture     Final    Value: STAPHYLOCOCCUS SPECIES (COAGULASE NEGATIVE)     Note: RIFAMPIN AND GENTAMICIN SHOULD NOT BE USED AS SINGLE DRUGS FOR TREATMENT OF STAPH INFECTIONS.   Report Status 03/28/2012 FINAL   Final    Organism ID, Bacteria STAPHYLOCOCCUS SPECIES (COAGULASE NEGATIVE)   Final   CULTURE, BLOOD (ROUTINE X 2)     Status: Normal (Preliminary result)   Collection Time   03/25/12  3:55 PM      Component Value Range Status Comment   Specimen Description BLOOD LEFT HAND   Final    Special Requests BOTTLES DRAWN AEROBIC ONLY 5CC   Final    Culture  Setup Time 03/25/2012 20:33   Final    Culture     Final    Value: ESCHERICHIA COLI     GRAM POSITIVE COCCI IN CHAINS     Note: Gram Stain Report Called to,Read Back By and Verified With: CARRIE H @1021   03/26/12 BY KRAWS   Report Status PENDING   Incomplete    Organism ID, Bacteria ESCHERICHIA COLI   Final   CULTURE, BLOOD (ROUTINE X 2)     Status: Normal (Preliminary result)   Collection Time   03/25/12  4:00 PM      Component Value Range Status Comment   Specimen Description BLOOD LEFT ARM   Final    Special Requests BOTTLES DRAWN AEROBIC ONLY 5CC   Final    Culture  Setup Time 03/25/2012 20:33   Final    Culture     Final    Value: ESCHERICHIA COLI     Note: SUSCEPTIBILITIES PERFORMED ON PREVIOUS CULTURE WITHIN THE LAST 5 DAYS.     GRAM POSITIVE COCCI IN CHAINS     Note: Gram Stain Report Called to,Read Back By and Verified With: CARRIE H @1021  03/26/12 BY KRAWS   Report Status PENDING   Incomplete   MRSA PCR SCREENING     Status: Normal   Collection Time   03/25/12  9:10 PM      Component Value Range Status Comment   MRSA by PCR NEGATIVE  NEGATIVE Final   ANAEROBIC CULTURE     Status: Normal (Preliminary result)   Collection Time   03/27/12 12:55 PM  Component Value Range Status Comment   Specimen Description FLUID BILE   Final    Special Requests NONE   Final    Gram Stain     Final    Value: NO WBC SEEN     NO ORGANISMS SEEN   Culture     Final    Value: NO ANAEROBES ISOLATED; CULTURE IN PROGRESS FOR 5 DAYS   Report Status PENDING   Incomplete   BODY FLUID CULTURE     Status: Normal (Preliminary result)   Collection Time   03/27/12 12:55 PM      Component Value Range Status Comment   Specimen Description FLUID BILE   Final    Special Requests NONE   Final    Gram Stain     Final    Value: NO WBC SEEN     NO ORGANISMS SEEN   Culture ABUNDANT GRAM NEGATIVE RODS   Final    Report Status PENDING   Incomplete     Medical History: Past Medical History  Diagnosis Date  . Coronary artery disease   . Myocardial infarct   . CHF (congestive heart failure)     Medications:  Prescriptions prior to admission  Medication Sig Dispense Refill  . aspirin EC 81 MG tablet  Take 81 mg by mouth daily.      . carvedilol (COREG) 12.5 MG tablet Take 12.5 mg by mouth 2 (two) times daily with a meal.      . HYDROcodone-acetaminophen (NORCO) 7.5-325 MG per tablet Take 1 tablet by mouth 3 (three) times daily.      . nitroGLYCERIN (NITROSTAT) 0.4 MG SL tablet Place 0.4 mg under the tongue every 5 (five) minutes as needed. For chest pain       Assessment: 71 yo female who developed hypotension, tachycardia, and worsening hypoxemic respiratory failure on the first day of admission here with probable UTI, sepsis, and abdominal process.   Blood x2 8/14 E. Coli, GPC in chains. Afebrile, WBC trending down, renal function stable, CrCl ~45-50 ml/min.   Cefepime 8/14 (1 day) Vancomycin 8/14 >> Fortaz/Flagyl 8/17>>    Goal of Therapy:  Vancomycin trough level 15-20 mcg/ml  Plan:  Continue Vancomycin to 1000 mg IV q24 hours Follow up culture results - awaiting ID of GPC Follow up vancomycin trough at steady state if clinically indicated   Okey Regal, PharmD [phone]  03/29/2012,8:29 AM

## 2012-03-30 DIAGNOSIS — R45851 Suicidal ideations: Secondary | ICD-10-CM

## 2012-03-30 LAB — CBC WITH DIFFERENTIAL/PLATELET
Basophils Absolute: 0 10*3/uL (ref 0.0–0.1)
Eosinophils Absolute: 0 10*3/uL (ref 0.0–0.7)
HCT: 29.3 % — ABNORMAL LOW (ref 36.0–46.0)
Lymphocytes Relative: 3 % — ABNORMAL LOW (ref 12–46)
MCH: 31.1 pg (ref 26.0–34.0)
MCHC: 34.1 g/dL (ref 30.0–36.0)
Monocytes Absolute: 2.1 10*3/uL — ABNORMAL HIGH (ref 0.1–1.0)
Neutro Abs: 16.3 10*3/uL — ABNORMAL HIGH (ref 1.7–7.7)
RDW: 13.6 % (ref 11.5–15.5)

## 2012-03-30 LAB — GLUCOSE, CAPILLARY
Glucose-Capillary: 186 mg/dL — ABNORMAL HIGH (ref 70–99)
Glucose-Capillary: 182 mg/dL — ABNORMAL HIGH (ref 70–99)
Glucose-Capillary: 166 mg/dL — ABNORMAL HIGH (ref 70–99)
Glucose-Capillary: 114 mg/dL — ABNORMAL HIGH (ref 70–99)

## 2012-03-30 LAB — BASIC METABOLIC PANEL
BUN: 36 mg/dL — ABNORMAL HIGH (ref 6–23)
Chloride: 109 mEq/L (ref 96–112)
Creatinine, Ser: 0.55 mg/dL (ref 0.50–1.10)
GFR calc Af Amer: >90 mL/min (ref >90)
GFR calc non Af Amer: >90 mL/min (ref >90)

## 2012-03-30 LAB — VANCOMYCIN, TROUGH: Vancomycin Tr: 12.8 ug/mL (ref 10.0–20.0)

## 2012-03-30 MED ORDER — POTASSIUM CHLORIDE 20 MEQ/15ML (10%) PO LIQD
40.0000 meq | ORAL | Status: AC
  Administered 2012-03-30 (×3): 40 meq
  Filled 2012-03-30 (×3): qty 30

## 2012-03-30 MED ORDER — FENTANYL CITRATE 0.05 MG/ML IJ SOLN
25.0000 ug | INTRAMUSCULAR | Status: DC | PRN
  Administered 2012-03-30 – 2012-04-04 (×43): 100 ug via INTRAVENOUS
  Filled 2012-03-30 (×46): qty 2

## 2012-03-30 NOTE — Progress Notes (Signed)
  Subjective: External biliary drain placed 8/16 Output good Pt up in bed; still intubated Trying to communicate with pen/paper  Objective: Vital signs in last 24 hours: Temp:  [97.9 F (36.6 C)-99.1 F (37.3 C)] 98.4 F (36.9 C) (08/19 0730) Pulse Rate:  [70-105] 77  (08/19 0700) Resp:  [14-23] 14  (08/19 0700) BP: (96-142)/(44-82) 114/56 mmHg (08/19 0700) SpO2:  [95 %-100 %] 97 % (08/19 0700) FiO2 (%):  [29.7 %-30.4 %] 30.1 % (08/19 0851) Weight:  [120 lb 5.9 oz (54.6 kg)] 120 lb 5.9 oz (54.6 kg) (08/19 0545) Last BM Date: 03/29/12  Intake/Output from previous day: 08/18 0701 - 08/19 0700 In: 2631.2 [I.V.:956.2; NG/GT:975; IV Piggyback:700] Out: 3010 [Urine:2890; Drains:120] Intake/Output this shift:    PE:  Afeb; VSS Output 120cc yesterday 150 cc in bag now: bile Site clean and dry; no sign of inf; NT Cxs : Ecoli  Lab Results:   Basename 03/30/12 0424 03/29/12 0345  WBC 19.0* 14.3*  HGB 10.0* 8.9*  HCT 29.3* 26.2*  PLT 45* 23*   BMET  Basename 03/30/12 0424 03/29/12 0345  NA 141 131*  K 2.9* 3.6  CL 109 100  CO2 25 22  GLUCOSE 202* 178*  BUN 36* 35*  CREATININE 0.55 0.56  CALCIUM 9.0 8.5   PT/INR  Basename 03/28/12 2100 03/28/12 0415  LABPROT 14.1 14.6  INR 1.07 1.12   ABG  Basename 03/29/12 0339 03/28/12 0331  PHART 7.414 7.365  HCO3 20.3 18.7*    Studies/Results: Dg Chest Port 1 View  03/29/2012  *RADIOLOGY REPORT*  Clinical Data: Endotracheal tube check  PORTABLE CHEST - 1 VIEW  Comparison: Yesterday  Findings: Endotracheal tube tip is 1.7 cm from the carina.  Stable NG tube and epigastric biliary drain.  Right internal jugular vein central venous catheter stable.  Bilateral pleural effusions and bibasilar airspace disease stable.  No pneumothorax.  There is continued shift of the mediastinum to the right worrisome for right lower lobe volume loss.  IMPRESSION: Endotracheal tube 1.7 cm from the carina.  Otherwise stable exam.  Original  Report Authenticated By: Donavan Burnet, M.D.    Anti-infectives:   Assessment/Plan: s/p External Bili drain 8/16  Discussed with Dr Bonnielee Haff Plan: need pt to be stable per pulmonary and off vent Will plan to Internalize drain and perform brush biopsy When we are able to bring to Radiology Will continue to follow and hopes to move forward in next few days  Coron Rossano A 03/30/2012

## 2012-03-30 NOTE — Progress Notes (Signed)
eLink Physician-Brief Progress Note Patient Name: Laurie Sparks DOB: 03/01/1941 MRN: [id number]  Date of Service  03/30/2012   HPI/Events of Note  Low k   eICU Interventions  Nita Sickle. 03/30/2012, 5:18 AM

## 2012-03-30 NOTE — Progress Notes (Signed)
Weaning on 10/5 as tolerated, pt needs to go to IR.

## 2012-03-30 NOTE — Progress Notes (Signed)
IV Fentanyl drip wasted in sink with Harlow Asa, RN.

## 2012-03-30 NOTE — Progress Notes (Signed)
Name: Laurie Sparks MRN: [id number] DOB: [birth year]    LOS: 6 days  Referring Provider:  Maryan Puls Reason for Referral:  Shock, hypoxemia  PULMONARY / CRITICAL CARE MEDICINE   Brief patient description:  71 y/o female with CHF and CAD admitted 8/14 with chest pain developed hypotension, tachycardia and worsening hypoxemic respiratory failure likely in the setting of sepsis likely due to cholangitis vs. Less likely SBO/abdominal process.  Events Since Admission: 8/15 >> s/p CT scan abd, CBD obstruction with probable mass 8/16 >> IR guided biliary (CBD) drain 8/17: Phenylephrine off, opens eyes to voice. Gi signed off  Current Status: 03/30/2012: Off pressors. Still on sedation gtt. Normal WUA. Starting SBT.  Per IR planning procedure next 1-2 days;    Vital Signs: Temp:  [97.9 F (36.6 C)-99.1 F (37.3 C)] 98.4 F (36.9 C) (08/19 0730) Pulse Rate:  [69-105] 77  (08/19 0700) Resp:  [14-23] 14  (08/19 0700) BP: (96-142)/(44-82) 114/56 mmHg (08/19 0700) SpO2:  [95 %-100 %] 97 % (08/19 0700) FiO2 (%):  [29.7 %-30.4 %] 30 % (08/19 0700) Weight:  [54.6 kg (120 lb 5.9 oz)] 54.6 kg (120 lb 5.9 oz) (08/19 0545)  Intake/Output Summary (Last 24 hours) at 03/30/12 0851 Last data filed at 03/30/12 0700  Gross per 24 hour  Intake 2582.2 ml  Output   2985 ml  Net -402.8 ml    Physical Examination: Gen: chronically ill and critically ill appearing, intubated HEENT: NCAT, PERRL, EOMi, ETT in place PULM: Insp crackles in bases bilaterally CV: rrr, no mgr,  AB: BS infrequent, soft, less tender on my exam today Ext: warm, no edema, no clubbing, no cyanosis Derm: no rash or skin breakdown per RN Neuro: sedated but arouses easily, follows commands. RASS -1. moes all 4s   Dg Chest Port 1 View  03/29/2012  *RADIOLOGY REPORT*  Clinical Data: Endotracheal tube check  PORTABLE CHEST - 1 VIEW  Comparison: Yesterday  Findings: Endotracheal tube tip is 1.7 cm from the carina.  Stable NG tube and  epigastric biliary drain.  Right internal jugular vein central venous catheter stable.  Bilateral pleural effusions and bibasilar airspace disease stable.  No pneumothorax.  There is continued shift of the mediastinum to the right worrisome for right lower lobe volume loss.  IMPRESSION: Endotracheal tube 1.7 cm from the carina.  Otherwise stable exam.  Original Report Authenticated By: Donavan Burnet, M.D.     Principal Problem:  *Chest pain syndrome Active Problems:  CAD (coronary artery disease)  COPD (chronic obstructive pulmonary disease)  Anxiety  CHF (congestive heart failure)  Vitamin D deficiency disease  Suicidal ideation  UTI (lower urinary tract infection)  Hypotension  Fever  Septic shock  Acute respiratory failure with hypoxia   ASSESSMENT AND PLAN  PULMONARY  Lab 03/29/12 0339 03/28/12 0331 03/26/12 0412 03/26/12 0305 03/26/12 0040  PHART 7.414 7.365 7.359 -- 7.363  PCO2ART 32.4* 32.6* 35.5 -- 39.1  PO2ART 95.5 142.0* 68.0* -- 65.0*  HCO3 20.3 18.7* 20.0 -- 21.5  O2SAT 98.2 99.0 93.0 78.9 93.3   Ventilator Settings: Vent Mode:  [-] PRVC FiO2 (%):  [29.7 %-30.4 %] 30 % Set Rate:  [14 bmp] 14 bmp Vt Set:  [450 mL] 450 mL PEEP:  [5 cmH20] 5 cmH20 Plateau Pressure:  [17 cmH20-19 cmH20] 19 cmH20 CXR:  Bilateral effusions and pulm edema unchanged 8/17 ETT:  8/15 >>  A:  Acute on chronic hypoxemic respiratory failure due to pulmonary edema, effusions; COPD on 3 L  O2 at home  - on 03/30/2012: Meets SBT criteria  P:  SBT as tolerated; no extubation  until biliary stenting and brushing can be performed -try to diurese as pressure allows   CARDIOVASCULAR  Lab 03/28/12 0415 03/27/12 0400 03/26/12 0130 03/26/12 0029 03/25/12 2200 03/25/12 1742 03/25/12 0927 03/25/12 0147  TROPONINI -- -- <0.30 <0.30 -- <0.30 <0.30 <0.30  LATICACIDVEN 1.7 2.5* 2.5* -- 3.5* -- -- --  PROBNP -- -- -- -- -- -- -- --   ECG:  Sinus tach, peaked T waves, no ST wave changes Lines:    03/26/12 R IJ CVL >> 03/25/12 2D TTE LVEF 60-65%, elevated PA pressure  A: Septic shock and currently volume overloaded based on physical exam and CXR; question mixed shock (cardiogenic?) given exam findings; CVP 8/17 am = 8-9. S/p EGDT CAD but ruled out for MI, 03/25/12 TTE normal Lactate 2.5 >> 2.5 on 8/16   - on 03/30/12: normal BP  P:  -monitor -see ID section - dc hydrocort    RENAL  Lab 03/30/12 0424 03/29/12 0345 03/28/12 0415 03/27/12 0400 03/26/12 0440 03/25/12 0521  NA 141 131* 127* 130* 139 --  K 2.9* 3.6 -- -- -- --  CL 109 100 97 101 104 --  CO2 25 22 21 19 23  --  BUN 36* 35* 27* 24* 20 --  CREATININE 0.55 0.56 0.55 0.68 0.55 --  CALCIUM 9.0 8.5 8.3* 7.4* 8.1* --  MG -- -- -- -- -- 1.6  PHOS -- -- -- -- -- --   Intake/Output      08/18 0701 - 08/19 0700 08/19 0701 - 08/20 0700   I.V. (mL/kg) 956.2 (17.5)    NG/GT 975    IV Piggyback 700    Total Intake(mL/kg) 2631.2 (48.2)    Urine (mL/kg/hr) 2890 (2.2)    Emesis/NG output     Drains 120    Stool     Total Output 3010    Net -378.8          Foley:  03/25/12  A:  Oliguria, and Hyponatremia all resolved. Low K 03/30/12 repleted by elink  P:   -kvo only - monitor bmp  GASTROINTESTINAL  Lab 03/28/12 0415 03/27/12 0400 03/26/12 0029 03/25/12 0521  AST 66* 76* 117* 360*  ALT 119* 139* 216* 425*  ALKPHOS 170* 156* 143* 239*  BILITOT 4.5* 4.5* 3.4* 3.6*  PROT 4.7* 4.6* 4.8* 6.0  ALBUMIN 2.1* 2.0* 2.5* 3.5    A:  Elevated transaminases and alk phos, improving,due to cholangitis (hx chole), CBD obstruction Also with infrequent bowel sounds, history of multiple abdominal surgeries, ileus on CT scan and improved bowel sounds 8/17; note huge hiatal hernia P:   -start tube feeds 8/17 -s/p IR drain, eventually needs ampullary biopsy; GI recommends discussing with IR because her anatomy would make ERCP technically difficult - Call placed to IR dr [facility] 03/30/12; awaiting reply -see  ID  HEMATOLOGIC  Lab 03/30/12 0424 03/29/12 0345 03/28/12 2100 03/28/12 0940 03/28/12 0415 03/27/12 0400 03/26/12 1530 03/26/12 1215  HGB 10.0* 8.9* -- -- 9.8* 10.3* 10.3* --  HCT 29.3* 26.2* -- -- 29.5* 31.9* 32.6* --  PLT 45* 23* 20* 14* 23* -- -- --  INR -- -- 1.07 -- 1.12 1.22 -- 1.47  APTT -- -- 30 -- -- -- -- 37   A:  Thrombocytopenia with no active bleeding; not on heparin, not in DIC, likely due to sepsis vs. meds (zosyn, vanc, pantop all possible)  Held Zosyn 8/17 PM  - on 03/30/12: Plat improving  P:  -stop vanc 8/19 if GPC's in blood not enterococcus -monitor for bleeding; PRBC only for Hgb < 7gm% unless actively bleeding  INFECTIOUS  Lab 03/30/12 0424 03/29/12 0345 03/28/12 0415 03/27/12 0400 03/26/12 1530 03/26/12 0130 03/25/12 2200  WBC 19.0* 14.3* 23.0* 26.3* 4.4 -- --  PROCALCITON -- -- -- 39.94 -- 11.59 9.25   Cultures: 03/25/12 blood >> ecoli +  ENterococcus 03/25/12 urine >> COAG NEG STAPH  8/16 Biliary drain >> E colii and Enterococcus +  Results for orders placed during the hospital encounter of 03/24/12  URINE CULTURE     Status: Normal   Collection Time   03/25/12  3:28 PM      Component Value Range Status Comment   Specimen Description URINE, RANDOM   Final    Special Requests NONE   Final    Culture  Setup Time 03/25/2012 15:54   Final    Colony Count 65,000 COLONIES/ML   Final    Culture     Final    Value: STAPHYLOCOCCUS SPECIES (COAGULASE NEGATIVE)     Note: RIFAMPIN AND GENTAMICIN SHOULD NOT BE USED AS SINGLE DRUGS FOR TREATMENT OF STAPH INFECTIONS.   Report Status 03/28/2012 FINAL   Final    Organism ID, Bacteria STAPHYLOCOCCUS SPECIES (COAGULASE NEGATIVE)   Final   CULTURE, BLOOD (ROUTINE X 2)     Status: Normal (Preliminary result)   Collection Time   03/25/12  3:55 PM      Component Value Range Status Comment   Specimen Description BLOOD LEFT HAND   Final    Special Requests BOTTLES DRAWN AEROBIC ONLY 5CC   Final    Culture  Setup Time  03/25/2012 20:33   Final    Culture     Final    Value: ESCHERICHIA COLI     ENTEROCOCCUS SPECIES     Note: Gram Stain Report Called to,Read Back By and Verified With: CARRIE H @1021  03/26/12 BY KRAWS   Report Status PENDING   Incomplete    Organism ID, Bacteria ESCHERICHIA COLI   Final   CULTURE, BLOOD (ROUTINE X 2)     Status: Normal (Preliminary result)   Collection Time   03/25/12  4:00 PM      Component Value Range Status Comment   Specimen Description BLOOD LEFT ARM   Final    Special Requests BOTTLES DRAWN AEROBIC ONLY 5CC   Final    Culture  Setup Time 03/25/2012 20:33   Final    Culture     Final    Value: ESCHERICHIA COLI     Note: SUSCEPTIBILITIES PERFORMED ON PREVIOUS CULTURE WITHIN THE LAST 5 DAYS.     GRAM POSITIVE COCCI IN CHAINS     Note: Gram Stain Report Called to,Read Back By and Verified With: CARRIE H @1021  03/26/12 BY KRAWS   Report Status PENDING   Incomplete   MRSA PCR SCREENING     Status: Normal   Collection Time   03/25/12  9:10 PM      Component Value Range Status Comment   MRSA by PCR NEGATIVE  NEGATIVE Final   ANAEROBIC CULTURE     Status: Normal (Preliminary result)   Collection Time   03/27/12 12:55 PM      Component Value Range Status Comment   Specimen Description FLUID BILE   Final    Special Requests NONE   Final    Gram Stain  Final    Value: NO WBC SEEN     NO ORGANISMS SEEN   Culture     Final    Value: NO ANAEROBES ISOLATED; CULTURE IN PROGRESS FOR 5 DAYS   Report Status PENDING   Incomplete   BODY FLUID CULTURE     Status: Normal (Preliminary result)   Collection Time   03/27/12 12:55 PM      Component Value Range Status Comment   Specimen Description FLUID BILE   Final    Special Requests NONE   Final    Gram Stain     Final    Value: NO WBC SEEN     NO ORGANISMS SEEN   Culture     Final    Value: ABUNDANT ESCHERICHIA COLI     MODERATE ENTEROCOCCUS SPECIES   Report Status PENDING   Incomplete    Organism ID, Bacteria ESCHERICHIA  COLI   Final      Antibiotics: 8/14 zosyn (UTI, Cholangitis)>>8/17 8/14 vanc (GPC's in blood) >> 8/17 ceftaz (ecoli bacteremia) >> 8/17 flagyl (cholangitis, bacteremia)>>  A:  Septic shock, ddx UTI, cholangitis, bowel perf?. S/p egdt  P:   -cont ceftaz/flagyl and vanc ---monitor biliary drain output   ENDOCRINE  Lab 03/30/12 0719 03/30/12 0415 03/29/12 2345 03/29/12 2007 03/29/12 1555  GLUCAP 182* 186* 191* 166* 158*   A:  Hyperglycemia  P:   -ICU hyperglycemia protocol  NEUROLOGIC  A:  Suicidality on 8/14  - on 03/30/12: Normal WUA  P:    - change to prn sedation starting 03/30/12; avoid benzo -psych to follow up after critical illness  BEST PRACTICE / DISPOSITION Level of Care:  ICU Primary Service:  PCCM Consultants:  psych Code Status:  full Diet:  npo DVT Px:  lovenox off GI Px:  ppi Skin Integrity:  normal Social / Family:  Updated son and daughter at bedside 8/15. None at bedside 8/19.     The patient is critically ill with multiple organ systems failure and requires high complexity decision making for assessment and support, frequent evaluation and titration of therapies, application of advanced monitoring technologies and extensive interpretation of multiple databases.   Critical Care Time devoted to patient care services described in this note is  35  Minutes.  Dr. Kalman Shan, M.D., [facility].C.P Pulmonary and Critical Care Medicine Staff Physician [facility] Pager: [phone], If no answer or between  15:00h - 7:00h: call [phone]  03/30/2012 9:00 AM

## 2012-03-31 ENCOUNTER — Encounter (HOSPITAL_COMMUNITY): Payer: Self-pay | Admitting: Radiology

## 2012-03-31 ENCOUNTER — Inpatient Hospital Stay (HOSPITAL_COMMUNITY): Payer: Medicare Other

## 2012-03-31 DIAGNOSIS — A419 Sepsis, unspecified organism: Secondary | ICD-10-CM

## 2012-03-31 DIAGNOSIS — K449 Diaphragmatic hernia without obstruction or gangrene: Secondary | ICD-10-CM

## 2012-03-31 DIAGNOSIS — K8050 Calculus of bile duct without cholangitis or cholecystitis without obstruction: Secondary | ICD-10-CM

## 2012-03-31 LAB — CBC WITH DIFFERENTIAL/PLATELET
Basophils Relative: 0 % (ref 0–1)
HCT: 30.4 % — ABNORMAL LOW (ref 36.0–46.0)
Hemoglobin: 10.4 g/dL — ABNORMAL LOW (ref 12.0–15.0)
Lymphocytes Relative: 5 % — ABNORMAL LOW (ref 12–46)
Monocytes Relative: 14 % — ABNORMAL HIGH (ref 3–12)
Neutro Abs: 18.1 10*3/uL — ABNORMAL HIGH (ref 1.7–7.7)
RBC: 3.3 MIL/uL — ABNORMAL LOW (ref 3.87–5.11)
WBC: 22.3 10*3/uL — ABNORMAL HIGH (ref 4.0–10.5)

## 2012-03-31 LAB — GLUCOSE, CAPILLARY
Glucose-Capillary: 106 mg/dL — ABNORMAL HIGH (ref 70–99)
Glucose-Capillary: 111 mg/dL — ABNORMAL HIGH (ref 70–99)
Glucose-Capillary: 131 mg/dL — ABNORMAL HIGH (ref 70–99)
Glucose-Capillary: 162 mg/dL — ABNORMAL HIGH (ref 70–99)

## 2012-03-31 LAB — CULTURE, BLOOD (ROUTINE X 2)

## 2012-03-31 LAB — COMPREHENSIVE METABOLIC PANEL
AST: 18 U/L (ref 0–37)
Albumin: 2.1 g/dL — ABNORMAL LOW (ref 3.5–5.2)
Calcium: 9.2 mg/dL (ref 8.4–10.5)
Chloride: 116 mEq/L — ABNORMAL HIGH (ref 96–112)
Creatinine, Ser: 0.44 mg/dL — ABNORMAL LOW (ref 0.50–1.10)

## 2012-03-31 LAB — BODY FLUID CULTURE: Gram Stain: NONE SEEN

## 2012-03-31 LAB — PHOSPHORUS: Phosphorus: 1.2 mg/dL — ABNORMAL LOW (ref 2.3–4.6)

## 2012-03-31 MED ORDER — DEXTROSE 5 % IV SOLN
INTRAVENOUS | Status: DC
  Administered 2012-04-01: 20 mL via INTRAVENOUS
  Administered 2012-04-02 – 2012-04-03 (×2): via INTRAVENOUS

## 2012-03-31 MED ORDER — FUROSEMIDE 10 MG/ML IJ SOLN
INTRAMUSCULAR | Status: AC
  Filled 2012-03-31: qty 4

## 2012-03-31 MED ORDER — PIPERACILLIN-TAZOBACTAM 3.375 G IVPB
3.3750 g | Freq: Three times a day (TID) | INTRAVENOUS | Status: DC
  Administered 2012-03-31 – 2012-04-06 (×18): 3.375 g via INTRAVENOUS
  Filled 2012-03-31 (×21): qty 50

## 2012-03-31 MED ORDER — VANCOMYCIN HCL 500 MG IV SOLR
250.0000 mg | Freq: Once | INTRAVENOUS | Status: AC
  Administered 2012-03-31: 250 mg via INTRAVENOUS
  Filled 2012-03-31: qty 250

## 2012-03-31 MED ORDER — IOHEXOL 300 MG/ML  SOLN
50.0000 mL | Freq: Once | INTRAMUSCULAR | Status: AC | PRN
  Administered 2012-03-31: 25 mL

## 2012-03-31 MED ORDER — FUROSEMIDE 10 MG/ML IJ SOLN
20.0000 mg | Freq: Once | INTRAMUSCULAR | Status: AC
  Administered 2012-03-31: 20 mg via INTRAVENOUS

## 2012-03-31 MED ORDER — MIDAZOLAM HCL 5 MG/5ML IJ SOLN
INTRAMUSCULAR | Status: AC | PRN
  Administered 2012-03-31: 2 mg via INTRAVENOUS

## 2012-03-31 MED ORDER — VANCOMYCIN HCL 1000 MG IV SOLR
1250.0000 mg | INTRAVENOUS | Status: DC
  Filled 2012-03-31: qty 1250

## 2012-03-31 MED ORDER — POTASSIUM PHOSPHATE DIBASIC 3 MMOLE/ML IV SOLN
24.0000 mmol | Freq: Once | INTRAVENOUS | Status: AC
  Administered 2012-03-31: 24 mmol via INTRAVENOUS
  Filled 2012-03-31: qty 8

## 2012-03-31 NOTE — Progress Notes (Signed)
Name: Laurie Sparks MRN: [id number] DOB: December 07, 1940    LOS: 7 days  Referring Provider:  Maryan Puls Reason for Referral:  Shock, hypoxemia  PULMONARY / CRITICAL CARE MEDICINE   Brief patient description:  71 y/o female with CHF and CAD admitted 8/14 with chest pain developed hypotension, tachycardia and worsening hypoxemic respiratory failure likely in the setting of sepsis likely due to cholangitis vs. Less likely SBO/abdominal process.  Events Since Admission: 8/15 >> s/p CT scan abd, CBD obstruction with probable mass 8/16 >> IR guided biliary (CBD) drain 8/17: Phenylephrine off, opens eyes to voice. Gi signed off 03/30/2012: Off pressors. Still on sedation gtt. Normal WUA. Starting SBT.    Current Status: 03/31/12: Normal WUA. Failed SBT in 1h cp dyspnea and got hypertensive. D/w Dr Bonnielee Haff - IR procedure later today  Vital Signs: Temp:  [98 F (36.7 C)-98.6 F (37 C)] 98.3 F (36.8 C) (08/20 0747) Pulse Rate:  [83-107] 107  (08/20 0800) Resp:  [14-24] 24  (08/20 0800) BP: (116-211)/(60-167) 211/167 mmHg (08/20 0800) SpO2:  [83 %-99 %] 98 % (08/20 0800) FiO2 (%):  [29.8 %-30.3 %] 30 % (08/20 0813) Weight:  [55.5 kg (122 lb 5.7 oz)] 55.5 kg (122 lb 5.7 oz) (08/20 0600)  Intake/Output Summary (Last 24 hours) at 03/31/12 0916 Last data filed at 03/31/12 0700  Gross per 24 hour  Intake   2030 ml  Output   1247 ml  Net    783 ml    Physical Examination: Gen: chronically ill and critically ill appearing, intubated HEENT: NCAT, PERRL, EOMi, ETT in place PULM: Insp crackles in bases bilaterally. Failed SBT CV: rrr, no mgr,  AB: BS infrequent, soft, less tender on my exam today Ext: warm, no edema, no clubbing, no cyanosis Derm: no rash or skin breakdown per RN Neuro: RASS 0. CAM-ICU negative for delirium  Dg Chest Port 1 View  03/31/2012  *RADIOLOGY REPORT*  Clinical Data: Sepsis  PORTABLE CHEST - 1 VIEW  Comparison: 03/29/2012  Findings: Endotracheal tube 2.2 cm above the  carina.  Other support apparatus stable.  Bilateral pleural effusions persist with bibasilar airspace disease/consolidation.  Large hiatal hernia noted with left hemidiaphragm elevation as before.  Stable heart size and vascularity.  No pneumothorax.  No significant interval change.  IMPRESSION: Stable portable chest exam   Original Report Authenticated By: Judie Petit. Ruel Favors, M.D.      Principal Problem:  *Chest pain syndrome Active Problems:  CAD (coronary artery disease)  COPD (chronic obstructive pulmonary disease)  Anxiety  CHF (congestive heart failure)  Vitamin D deficiency disease  Suicidal ideation  UTI (lower urinary tract infection)  Hypotension  Fever  Septic shock  Acute respiratory failure with hypoxia   ASSESSMENT AND PLAN  PULMONARY  Lab 03/29/12 0339 03/28/12 0331 03/26/12 0412 03/26/12 0305 03/26/12 0040  PHART 7.414 7.365 7.359 -- 7.363  PCO2ART 32.4* 32.6* 35.5 -- 39.1  PO2ART 95.5 142.0* 68.0* -- 65.0*  HCO3 20.3 18.7* 20.0 -- 21.5  O2SAT 98.2 99.0 93.0 78.9 93.3   Ventilator Settings: Vent Mode:  [-] PRVC FiO2 (%):  [29.8 %-30.3 %] 30 % Set Rate:  [14 bmp] 14 bmp Vt Set:  [450 mL] 450 mL PEEP:  [4.4 cmH20-5 cmH20] 5 cmH20 Pressure Support:  [5 cmH20-10 cmH20] 5 cmH20 Plateau Pressure:  [18 cmH20] 18 cmH20 CXR:  Bilateral effusions and pulm edema unchanged 8/17 ETT:  8/15 >>  A:  Acute on chronic hypoxemic respiratory failure due to pulmonary edema, effusions;  COPD on 3 L O2 at home  - on 03/31/2012: Meets SBT criteria but failed SBT  P:  SBT as tolerated; no extubation  until biliary stenting and brushing can be performed -try to diurese as pressure allows - check bnp    CARDIOVASCULAR  Lab 03/28/12 0415 03/27/12 0400 03/26/12 0130 03/26/12 0029 03/25/12 2200 03/25/12 1742 03/25/12 0927 03/25/12 0147  TROPONINI -- -- <0.30 <0.30 -- <0.30 <0.30 <0.30  LATICACIDVEN 1.7 2.5* 2.5* -- 3.5* -- -- --  PROBNP -- -- -- -- -- -- -- --   ECG:  Sinus  tach, peaked T waves, no ST wave changes Lines:  03/26/12 R IJ CVL >> 03/25/12 2D TTE LVEF 60-65%, elevated PA pressure  A: Septic shock and currently volume overloaded based on physical exam and CXR; question mixed shock (cardiogenic?) given exam findings; CVP 8/17 am = 8-9. S/p EGDT CAD but ruled out for MI, 03/25/12 TTE normal Lactate 2.5 >> 2.5 on 8/16   - on 03/30/12 and 03/31/12: normal BP  P:  -monitor -see ID section - check bnp (faled sbt andcxr wet)    RENAL  Lab 03/31/12 0419 03/30/12 0424 03/29/12 0345 03/28/12 0415 03/27/12 0400 03/25/12 0521  NA 149* 141 131* 127* 130* --  K 3.4* 2.9* -- -- -- --  CL 116* 109 100 97 101 --  CO2 27 25 22 21 19  --  BUN 26* 36* 35* 27* 24* --  CREATININE 0.44* 0.55 0.56 0.55 0.68 --  CALCIUM 9.2 9.0 8.5 8.3* 7.4* --  MG 2.1 -- -- -- -- 1.6  PHOS 1.2* -- -- -- -- --   Intake/Output      08/19 0701 - 08/20 0700 08/20 0701 - 08/21 0700   I.V. (mL/kg) 480 (8.6)    NG/GT 1020    IV Piggyback 700    Total Intake(mL/kg) 2200 (39.6)    Urine (mL/kg/hr) 1135 (0.9)    Drains 210    Stool 2    Total Output 1347    Net +853          Foley:  03/25/12  A:  Oliguria, and Hyponatremia all resolved 8/19  - on 03/31/2012: Hypokalemic and Low Phos and Hypernatremic   P:   -kvo only but change to d5 water -K phos repletion - monitor bmp  GASTROINTESTINAL  Lab 03/31/12 0419 03/28/12 0415 03/27/12 0400 03/26/12 0029 03/25/12 0521  AST 18 66* 76* 117* 360*  ALT 49* 119* 139* 216* 425*  ALKPHOS 140* 170* 156* 143* 239*  BILITOT 2.0* 4.5* 4.5* 3.4* 3.6*  PROT 4.7* 4.7* 4.6* 4.8* 6.0  ALBUMIN 2.1* 2.1* 2.0* 2.5* 3.5    A:  Elevated transaminases and alk phos, improving,due to cholangitis (hx chole), CBD obstruction Also with infrequent bowel sounds, history of multiple abdominal surgeries, ileus on CT scan and improved bowel sounds 8/17; note huge hiatal hernia   -8/16 external biliary drain  - on 03/31/12: Tolerating tube feeds  since 8/17  P:   - IR procedure 03/31/2012 for internalizing biliary drain and brush bx of ampullary/biliary abnormalities -see ID  HEMATOLOGIC  Lab 03/31/12 0419 03/30/12 0424 03/29/12 0345 03/28/12 2100 03/28/12 0940 03/28/12 0415 03/27/12 0400 03/26/12 1215  HGB 10.4* 10.0* 8.9* -- -- 9.8* 10.3* --  HCT 30.4* 29.3* 26.2* -- -- 29.5* 31.9* --  PLT 68* 45* 23* 20* 14* -- -- --  INR -- -- -- 1.07 -- 1.12 1.22 1.47  APTT -- -- -- 30 -- -- --  37   A:  Thrombocytopenia - likely sepsis related (not on heparin, not in DIC)   - on 03/2012: Plat improving  P:  -monitor for bleeding; PRBC only for Hgb < 7gm% unless actively bleeding  INFECTIOUS  Lab 03/31/12 0419 03/30/12 0424 03/29/12 0345 03/28/12 0415 03/27/12 0400 03/26/12 0130 03/25/12 2200  WBC 22.3* 19.0* 14.3* 23.0* 26.3* -- --  PROCALCITON -- -- -- -- 39.94 11.59 9.25   Cultures: 03/25/12 blood >> ecoli +  ENterococcus 03/25/12 urine >> COAG NEG STAPH  8/16 Biliary drain >> E colii and Enterococcus +  Results for orders placed during the hospital encounter of 03/24/12  URINE CULTURE     Status: Normal   Collection Time   03/25/12  3:28 PM      Component Value Range Status Comment   Specimen Description URINE, RANDOM   Final    Special Requests NONE   Final    Culture  Setup Time 03/25/2012 15:54   Final    Colony Count 65,000 COLONIES/ML   Final    Culture     Final    Value: STAPHYLOCOCCUS SPECIES (COAGULASE NEGATIVE)     Note: RIFAMPIN AND GENTAMICIN SHOULD NOT BE USED AS SINGLE DRUGS FOR TREATMENT OF STAPH INFECTIONS.   Report Status 03/28/2012 FINAL   Final    Organism ID, Bacteria STAPHYLOCOCCUS SPECIES (COAGULASE NEGATIVE)   Final   CULTURE, BLOOD (ROUTINE X 2)     Status: Normal (Preliminary result)   Collection Time   03/25/12  3:55 PM      Component Value Range Status Comment   Specimen Description BLOOD LEFT HAND   Final    Special Requests BOTTLES DRAWN AEROBIC ONLY 5CC   Final    Culture  Setup Time  03/25/2012 20:33   Final    Culture     Final    Value: ESCHERICHIA COLI     ENTEROCOCCUS SPECIES     Note: Gram Stain Report Called to,Read Back By and Verified With: CARRIE H @1021  03/26/12 BY KRAWS   Report Status PENDING   Incomplete    Organism ID, Bacteria ESCHERICHIA COLI   Final   CULTURE, BLOOD (ROUTINE X 2)     Status: Normal (Preliminary result)   Collection Time   03/25/12  4:00 PM      Component Value Range Status Comment   Specimen Description BLOOD LEFT ARM   Final    Special Requests BOTTLES DRAWN AEROBIC ONLY 5CC   Final    Culture  Setup Time 03/25/2012 20:33   Final    Culture     Final    Value: ESCHERICHIA COLI     Note: SUSCEPTIBILITIES PERFORMED ON PREVIOUS CULTURE WITHIN THE LAST 5 DAYS.     GRAM POSITIVE COCCI IN CHAINS     Note: Gram Stain Report Called to,Read Back By and Verified With: CARRIE H @1021  03/26/12 BY KRAWS   Report Status PENDING   Incomplete   MRSA PCR SCREENING     Status: Normal   Collection Time   03/25/12  9:10 PM      Component Value Range Status Comment   MRSA by PCR NEGATIVE  NEGATIVE Final   ANAEROBIC CULTURE     Status: Normal (Preliminary result)   Collection Time   03/27/12 12:55 PM      Component Value Range Status Comment   Specimen Description FLUID BILE   Final    Special Requests NONE   Final  Gram Stain     Final    Value: NO WBC SEEN     NO ORGANISMS SEEN   Culture     Final    Value: NO ANAEROBES ISOLATED; CULTURE IN PROGRESS FOR 5 DAYS   Report Status PENDING   Incomplete   BODY FLUID CULTURE     Status: Normal (Preliminary result)   Collection Time   03/27/12 12:55 PM      Component Value Range Status Comment   Specimen Description FLUID BILE   Final    Special Requests NONE   Final    Gram Stain     Final    Value: NO WBC SEEN     NO ORGANISMS SEEN   Culture     Final    Value: ABUNDANT ESCHERICHIA COLI     MODERATE ENTEROCOCCUS SPECIES   Report Status PENDING   Incomplete    Organism ID, Bacteria ESCHERICHIA  COLI   Final   VRE CULTURE     Status: Normal (Preliminary result)   Collection Time   03/29/12  5:16 AM      Component Value Range Status Comment   Specimen Description STOOL   Final    Special Requests NONE   Final    Culture Culture reincubated for better growth   Final    Report Status PENDING   Incomplete      Antibiotics: 8/14 zosyn (UTI, Cholangitis)>>8/17 8/14 vanc (GPC's in blood) >> 8/17 ceftaz (ecoli bacteremia) >> 8/17 flagyl (cholangitis, bacteremia)>>  A:  Septic shock, ddx UTI, cholangitis, bowel perf?. S/p egdt  P:   -cont ceftaz/flagyl and vanc ---monitor biliary drain output  - wait for IR procedure completeion 8/20  ENDOCRINE  Lab 03/31/12 0745 03/31/12 0414 03/31/12 0010 03/30/12 1948 03/30/12 1555  GLUCAP 114* 111* 131* 114* 131*   A:  Hyperglycemia  P:   -ICU hyperglycemia protocol  NEUROLOGIC  A:  Suicidality on 8/14  - on 03/30/12 abnd 03/31/12: Normal WUA  P:    - change to prn sedation starting 03/30/12; avoid benzo -psych to follow up after critical illness  BEST PRACTICE / DISPOSITION Level of Care:  ICU Primary Service:  PCCM Consultants:  psych Code Status:  full Diet:  npo DVT Px:  lovenox off GI Px:  ppi Skin Integrity:  normal Social / Family:  Updated son and daughter at bedside 8/15. None at bedside 8/19 and 03/31/12    The patient is critically ill with multiple organ systems failure and requires high complexity decision making for assessment and support, frequent evaluation and titration of therapies, application of advanced monitoring technologies and extensive interpretation of multiple databases.   Critical Care Time devoted to patient care services described in this note is  35  Minutes.  Dr. Kalman Shan, M.D., [facility].C.P Pulmonary and Critical Care Medicine Staff Physician [facility] Pager: [phone], If no answer or between  15:00h - 7:00h: call [phone]  03/31/2012 9:16 AM

## 2012-03-31 NOTE — Progress Notes (Signed)
Nutrition Follow-up  Intervention:    Once K & Phos repleted, will increase Vital AF 1.2 formula to goal rate of 45 ml/hr to provide 1296 kcals, 81 gm protein, 876 ml of free water RD to follow for nutrition care plan  Assessment:   Patient remains intubated on ventilator support.  MV: 8.9 Temp: 36.8 C  Vital AF 1.2 formula infusing at 35 ml/hr via OGT providing 1008 kcals, 64 gm protein, 681 ml of free water. S/p IR procedure: internalizing biliary drain and brush biopsy of ampullary/biliary abnormalities. + K and Phos repletion. CT of abdomen/pelvis 8/15 reviewed -- noted distortion of gastro-duodenal anatomy.  Diet Order:  NPO  Meds: Scheduled Meds:   . albuterol  4 puff Inhalation Q4H  . antiseptic oral rinse  15 mL Mouth Rinse BID  . antiseptic oral rinse  15 mL Mouth Rinse QID  . aspirin EC  81 mg Oral Daily  . cefTAZidime (FORTAZ)  IV  2 g Intravenous Q12H  . chlorhexidine  15 mL Mouth Rinse BID  . docusate  100 mg Oral BID  . feeding supplement (VITAL AF 1.2 CAL)  1,000 mL Per Tube Q24H  . furosemide  20 mg Intravenous Once  . insulin aspart  0-15 Units Subcutaneous Q4H  . insulin glargine  5 Units Subcutaneous QHS  . metronidazole  500 mg Intravenous Q8H  . multivitamin with minerals  1 tablet Per Tube Daily  . pantoprazole sodium  40 mg Oral BID AC  . potassium chloride  40 mEq Per Tube Q4H  . potassium phosphate IVPB (mmol)  24 mmol Intravenous Once  . sodium chloride  10-40 mL Intracatheter Q12H  . vancomycin  1,250 mg Intravenous Q24H  . vancomycin  250 mg Intravenous Once  . DISCONTD: sodium chloride  3 mL Intravenous Q12H  . DISCONTD: vancomycin  1,000 mg Intravenous Q24H   Continuous Infusions:   . dextrose    . dextrose    . DISCONTD: sodium chloride 20 mL/hr (03/30/12 1007)   PRN Meds:.acetaminophen, acetaminophen, albuterol, albuterol, dextrose, fentaNYL, iohexol, ipratropium, midazolam, midazolam, nitroGLYCERIN, ondansetron, ondansetron (ZOFRAN) IV,  sodium chloride, DISCONTD: sodium chloride, DISCONTD:  morphine injection, DISCONTD: sodium chloride  Labs:  CMP     Component Value Date/Time   NA 149* 03/31/2012 0419   K 3.4* 03/31/2012 0419   CL 116* 03/31/2012 0419   CO2 27 03/31/2012 0419   GLUCOSE 125* 03/31/2012 0419   BUN 26* 03/31/2012 0419   CREATININE 0.44* 03/31/2012 0419   CALCIUM 9.2 03/31/2012 0419   PROT 4.7* 03/31/2012 0419   ALBUMIN 2.1* 03/31/2012 0419   AST 18 03/31/2012 0419   ALT 49* 03/31/2012 0419   ALKPHOS 140* 03/31/2012 0419   BILITOT 2.0* 03/31/2012 0419   GFRNONAA >90 03/31/2012 0419   GFRAA >90 03/31/2012 0419    Phosphorus  Date Value Range Status  03/31/2012 1.2* 2.3 - 4.6 mg/dL Final    Magnesium  Date Value Range Status  03/31/2012 2.1  1.5 - 2.5 mg/dL Final     Intake/Output Summary (Last 24 hours) at 03/31/12 1120 Last data filed at 03/31/12 1000  Gross per 24 hour  Intake   1970 ml  Output   1292 ml  Net    678 ml    CBG (last 3)   Basename 03/31/12 0745 03/31/12 0414 03/31/12 0010  GLUCAP 114* 111* 131*    Weight Status:  55.5 kg (8/20) -- fluctuating  Re-estimated needs:  1200-1300 kcals, 80-90 gm protein  Nutrition Dx: Inadequate Oral Intake r/t inability to eat as evidenced by NPO status, ongoing  Goal:  EN to meet >/= 90% of estimated nutrition needs, progressing  Monitor:  EN regimen & tolerance, respiratory status, weight, labs, I/O's  Kirkland Hun, RD, LDN Pager #: [phone] After-Hours Pager #: [phone]

## 2012-03-31 NOTE — Progress Notes (Signed)
Subjective: Laurie Sparks remains intubated but very awake and alert. Dr. Bonnielee Haff d/w Dr. Marchelle Gearing plans to attempt internalization of bilary drain and obtain brush biopsy while intubated. Laurie Sparks understands all discussed  Objective: Physical Exam: BP 211/167  Pulse 107  Temp 98.3 F (36.8 C) (Oral)  Resp 24  Ht 5\' 1"  (1.549 m)  Wt 122 lb 5.7 oz (55.5 kg)  BMI 23.12 kg/m2  SpO2 98% Awake and alert Intubated Lungs: CTA Heart: Reg Abd: Biliary drain intact, output thin bilious with some sediment.   Labs: CBC  Basename 03/31/12 0419 03/30/12 0424  WBC 22.3* 19.0*  HGB 10.4* 10.0*  HCT 30.4* 29.3*  PLT 68* 45*   BMET  Basename 03/31/12 0419 03/30/12 0424  NA 149* 141  K 3.4* 2.9*  CL 116* 109  CO2 27 25  GLUCOSE 125* 202*  BUN 26* 36*  CREATININE 0.44* 0.55  CALCIUM 9.2 9.0   LFT  Basename 03/31/12 0419  PROT 4.7*  ALBUMIN 2.1*  AST 18  ALT 49*  ALKPHOS 140*  BILITOT 2.0*  BILIDIR --  IBILI --  LIPASE --   Laurie Sparks/INR  Basename 03/28/12 2100  LABPROT 14.1  INR 1.07     Studies/Results: Dg Chest Port 1 View  03/31/2012  *RADIOLOGY REPORT*  Clinical Data: Sepsis  PORTABLE CHEST - 1 VIEW  Comparison: 03/29/2012  Findings: Endotracheal tube 2.2 cm above the carina.  Other support apparatus stable.  Bilateral pleural effusions persist with bibasilar airspace disease/consolidation.  Large hiatal hernia noted with left hemidiaphragm elevation as before.  Stable heart size and vascularity.  No pneumothorax.  No significant interval change.  IMPRESSION: Stable portable chest exam   Original Report Authenticated By: Judie Petit. Ruel Favors, M.D.     Assessment/Plan: Biliary obstruction S/p ext bili drain 8/16. Bilirubin down to 2.0 WBC trending up past 2 days. PLTs up to 68 Will do cholangiogram, brush biopsy, and attempt at internalization of biliary drain today. Discussed with Laurie Sparks as well as son via telephone Consent obtained from son.    LOS: 7 days    Brayton El  PA-C 03/31/2012 9:16 AM

## 2012-03-31 NOTE — Progress Notes (Signed)
ANTIBIOTIC CONSULT NOTE - Follow-Up  Pharmacy Consult for zosyn  Indication: bacteremia/cholangitis  Allergies  Allergen Reactions  . Ambien (Zolpidem Tartrate) Other (See Comments)    Hallucinate   . Other     Base metal and something else    Labs:  Basename 03/31/12 0419 03/30/12 0424 03/29/12 0345  WBC 22.3* 19.0* 14.3*  HGB 10.4* 10.0* 8.9*  PLT 68* 45* 23*  LABCREA -- -- --  CREATININE 0.44* 0.55 0.56   Estimated Creatinine Clearance: 48.7 ml/min (by C-G formula based on Cr of 0.44).  Basename 03/30/12 2300  VANCOTROUGH 12.8  VANCOPEAK --  VANCORANDOM --  GENTTROUGH --  GENTPEAK --  GENTRANDOM --  TOBRATROUGH --  TOBRAPEAK --  TOBRARND --  AMIKACINPEAK --  AMIKACINTROU --  AMIKACIN --     Microbiology: Recent Results (from the past 720 hour(s))  URINE CULTURE     Status: Normal   Collection Time   03/25/12  3:28 PM      Component Value Range Status Comment   Specimen Description URINE, RANDOM   Final    Special Requests NONE   Final    Culture  Setup Time 03/25/2012 15:54   Final    Colony Count 65,000 COLONIES/ML   Final    Culture     Final    Value: STAPHYLOCOCCUS SPECIES (COAGULASE NEGATIVE)     Note: RIFAMPIN AND GENTAMICIN SHOULD NOT BE USED AS SINGLE DRUGS FOR TREATMENT OF STAPH INFECTIONS.   Report Status 03/28/2012 FINAL   Final    Organism ID, Bacteria STAPHYLOCOCCUS SPECIES (COAGULASE NEGATIVE)   Final   CULTURE, BLOOD (ROUTINE X 2)     Status: Normal   Collection Time   03/25/12  3:55 PM      Component Value Range Status Comment   Specimen Description BLOOD LEFT HAND   Final    Special Requests BOTTLES DRAWN AEROBIC ONLY 5CC   Final    Culture  Setup Time 03/25/2012 20:33   Final    Culture     Final    Value: ESCHERICHIA COLI     ENTEROCOCCUS GALLINARUM     Note: Gram Stain Report Called to,Read Back By and Verified With: CARRIE H @1021  03/26/12 BY KRAWS   Report Status 03/31/2012 FINAL   Final    Organism ID, Bacteria ESCHERICHIA COLI    Final    Organism ID, Bacteria ENTEROCOCCUS GALLINARUM   Final   CULTURE, BLOOD (ROUTINE X 2)     Status: Normal (Preliminary result)   Collection Time   03/25/12  4:00 PM      Component Value Range Status Comment   Specimen Description BLOOD LEFT ARM   Final    Special Requests BOTTLES DRAWN AEROBIC ONLY 5CC   Final    Culture  Setup Time 03/25/2012 20:33   Final    Culture     Final    Value: ESCHERICHIA COLI     Note: SUSCEPTIBILITIES PERFORMED ON PREVIOUS CULTURE WITHIN THE LAST 5 DAYS.     GRAM POSITIVE COCCI IN CHAINS     Note: Gram Stain Report Called to,Read Back By and Verified With: CARRIE H @1021  03/26/12 BY KRAWS   Report Status PENDING   Incomplete   MRSA PCR SCREENING     Status: Normal   Collection Time   03/25/12  9:10 PM      Component Value Range Status Comment   MRSA by PCR NEGATIVE  NEGATIVE Final   ANAEROBIC CULTURE  Status: Normal (Preliminary result)   Collection Time   03/27/12 12:55 PM      Component Value Range Status Comment   Specimen Description FLUID BILE   Final    Special Requests NONE   Final    Gram Stain     Final    Value: NO WBC SEEN     NO ORGANISMS SEEN   Culture     Final    Value: NO ANAEROBES ISOLATED; CULTURE IN PROGRESS FOR 5 DAYS   Report Status PENDING   Incomplete   BODY FLUID CULTURE     Status: Normal   Collection Time   03/27/12 12:55 PM      Component Value Range Status Comment   Specimen Description FLUID BILE   Final    Special Requests NONE   Final    Gram Stain     Final    Value: NO WBC SEEN     NO ORGANISMS SEEN   Culture     Final    Value: ABUNDANT ESCHERICHIA COLI     MODERATE ENTEROCOCCUS GALLINARUM     Note: CRITICAL RESULT CALLED TO, READ BACK BY AND VERIFIED WITH: CARRIE H @1259  03/31/12 BY KRAWS   Report Status 03/31/2012 FINAL   Final    Organism ID, Bacteria ESCHERICHIA COLI   Final    Organism ID, Bacteria ENTEROCOCCUS GALLINARUM   Final   VRE CULTURE     Status: Normal (Preliminary result)    Collection Time   03/29/12  5:16 AM      Component Value Range Status Comment   Specimen Description STOOL   Final    Special Requests NONE   Final    Culture Culture reincubated for better growth   Final    Report Status PENDING   Incomplete     Medical History: Past Medical History  Diagnosis Date  . Coronary artery disease   . Myocardial infarct   . CHF (congestive heart failure)     Medications:  Prescriptions prior to admission  Medication Sig Dispense Refill  . aspirin EC 81 MG tablet Take 81 mg by mouth daily.      . carvedilol (COREG) 12.5 MG tablet Take 12.5 mg by mouth 2 (two) times daily with a meal.      . HYDROcodone-acetaminophen (NORCO) 7.5-325 MG per tablet Take 1 tablet by mouth 3 (three) times daily.      . nitroGLYCERIN (NITROSTAT) 0.4 MG SL tablet Place 0.4 mg under the tongue every 5 (five) minutes as needed. For chest pain       Assessment: 71 yo female who was admitted for cholangitis with associated bacteremia. Blood culture came back with e.coli as well as enterococcus gallinarum. This particular enterococcus species is intermediate to vanc. D/w Dr. Sung Amabile, change vanc/fortaz/flagyl to zosyn to cover for both organisms.   Cefepime 8/14 (1 day) Vancomycin 8/14 >>8/20 Fortaz/Flagyl 8/17>>8/20 Zosyn 8/20>>    Plan:  1. Zosyn 3.375g IV q8 extended infusion

## 2012-03-31 NOTE — Consult Note (Addendum)
Reason for Consult: Cholangitis with obstruction/sepsis Referring Physician: Dr. Sung Amabile Critical [facility]  Laurie Sparks is an 71 y.o. female.  HPI: The patient is a 71 year old female was brought to the emergency room 03/24/2014 with chest pain. Her son says she thought she was having a heart attack. On arrival in the ER here she was hypotensive and tachycardic. CT scan on admission a very large hiatal hernia containing stomach to the right of the midline and stomach pancreas and some of the duodenum was noted to be in the chest. Common bile duct was markedly dilated with a 1.5 x 1 cm abnormal soft tissue release and and a 2.7 x 1.9 cm obstructing mass distally near the ampulla and the left abdomen. She has a history of prior cholecystectomy. At this point a diagnosis of severe sepsis with obstructing cholangitis. She required intubation and underwent transhepatic percutaneous cholangiogram, with successful placement of an 8 [place] external percutaneous trans-hepatic biliary drain. 03/31/2012 she was taken back to IR  Her cholangiogram demonstrates the common bile duct extends cephalad as left of midline. Cholangiogram showed a large filling defect in the common bile duct consistent with a very large stone. Additional filling defects throughout the biliary system were suggestive of additional stones. The ampulla was widely patent and contrast now spontaneously drained and the small bowel the underlying stricture lesion was not identified. It was his opinion the cause of the obstructive jaundice with due biliary stones with a large stones in the distal common bile duct which were normal throughout the procedure. She had good internal/external biliary drainage at this point. She is hemodynamically stable and is recovering from her sepsis. Cultures have shown both Escherichia coli and enterococcus. Critical care medicine has her on appropriate antibiotics. She is being fed  Thru an orogastric tube with tube  feedings. She is stable on the ventilator, alert, and watching TV. We were asked to see for possible surgical intervention if needed. Past Medical History  Diagnosis Date  . Coronary artery disease   . Myocardial infarct   . CHF (congestive heart failure) COPD HX of anxiety/depression  Chronic pain     Past Surgical History  Procedure Date  . Cholecystectomy Hiatal hernia repair about 2006 at [facility], then this reportedly failed and she had a second operation at [facility] in [place].  She could not be completely reduced per her son because of the length of the esophagus.   . Abdominal hysterectomy   . Ankle surgery Abdominal plasty of some type in 1980's Left ORIF left femur      No family history on file.  Social History:  reports that she has never smoked. She does not have any smokeless tobacco history on file. She reports that she does not drink alcohol or use illicit drugs.  Allergies:  Allergies  Allergen Reactions  . Ambien (Zolpidem Tartrate) Other (See Comments)    Hallucinate   . Other     Base metal and something else    Medications:  Prior to Admission:  Prescriptions prior to admission  Medication Sig Dispense Refill  . aspirin EC 81 MG tablet Take 81 mg by mouth daily.      . carvedilol (COREG) 12.5 MG tablet Take 12.5 mg by mouth 2 (two) times daily with a meal.      . HYDROcodone-acetaminophen (NORCO) 7.5-325 MG per tablet Take 1 tablet by mouth 3 (three) times daily.      . nitroGLYCERIN (NITROSTAT) 0.4 MG SL tablet  Place 0.4 mg under the tongue every 5 (five) minutes as needed. For chest pain       Scheduled:   . albuterol  4 puff Inhalation Q4H  . antiseptic oral rinse  15 mL Mouth Rinse BID  . antiseptic oral rinse  15 mL Mouth Rinse QID  . aspirin EC  81 mg Oral Daily  . chlorhexidine  15 mL Mouth Rinse BID  . docusate  100 mg Oral BID  . feeding supplement (VITAL AF 1.2 CAL)  1,000 mL Per Tube Q24H  . furosemide      .  furosemide  20 mg Intravenous Once  . insulin aspart  0-15 Units Subcutaneous Q4H  . insulin glargine  5 Units Subcutaneous QHS  . multivitamin with minerals  1 tablet Per Tube Daily  . pantoprazole sodium  40 mg Oral BID AC  . piperacillin-tazobactam (ZOSYN)  IV  3.375 g Intravenous Q8H  . potassium phosphate IVPB (mmol)  24 mmol Intravenous Once  . sodium chloride  10-40 mL Intracatheter Q12H  . vancomycin  250 mg Intravenous Once  . DISCONTD: cefTAZidime (FORTAZ)  IV  2 g Intravenous Q12H  . DISCONTD: metronidazole  500 mg Intravenous Q8H  . DISCONTD: sodium chloride  3 mL Intravenous Q12H  . DISCONTD: vancomycin  1,250 mg Intravenous Q24H  . DISCONTD: vancomycin  1,000 mg Intravenous Q24H   Continuous:   . dextrose    . dextrose    . DISCONTD: sodium chloride 20 mL/hr (03/30/12 1007)   BJY:[id number], acetaminophen, albuterol, albuterol, dextrose, fentaNYL, iohexol, ipratropium, midazolam, midazolam, nitroGLYCERIN, ondansetron, ondansetron (ZOFRAN) IV, sodium chloride, DISCONTD: sodium chloride, DISCONTD: sodium chloride Anti-infectives     Start     Dose/Rate Route Frequency Ordered Stop   04/01/12 0100   vancomycin (VANCOCIN) 1,250 mg in sodium chloride 0.9 % 250 mL IVPB  Status:  Discontinued        1,250 mg 166.7 mL/hr over 90 Minutes Intravenous Every 24 hours 03/31/12 0006 03/31/12 1604   03/31/12 1630  piperacillin-tazobactam (ZOSYN) IVPB 3.375 g       3.375 g 12.5 mL/hr over 240 Minutes Intravenous Every 8 hours 03/31/12 1600     03/31/12 0100   vancomycin (VANCOCIN) 250 mg in sodium chloride 0.9 % 100 mL IVPB        250 mg 100 mL/hr over 60 Minutes Intravenous  Once 03/31/12 0028 03/31/12 0227   03/28/12 2200   cefTAZidime (FORTAZ) injection 2 g  Status:  Discontinued        2 g Intramuscular Every 12 hours 03/28/12 1613 03/28/12 1619   03/28/12 1700   metroNIDAZOLE (FLAGYL) IVPB 500 mg  Status:  Discontinued        500 mg 100 mL/hr over 60 Minutes  Intravenous Every 8 hours 03/28/12 1613 03/31/12 1604   03/28/12 1700   cefTAZidime (FORTAZ) 2 g in dextrose 5 % 50 mL IVPB  Status:  Discontinued        2 g 100 mL/hr over 30 Minutes Intravenous Every 12 hours 03/28/12 1619 03/31/12 1604   03/27/12 2300   vancomycin (VANCOCIN) IVPB 1000 mg/200 mL premix  Status:  Discontinued        1,000 mg 200 mL/hr over 60 Minutes Intravenous Every 24 hours 03/27/12 1135 03/31/12 0004   03/26/12 0600   piperacillin-tazobactam (ZOSYN) IVPB 3.375 g  Status:  Discontinued        3.375 g 12.5 mL/hr over 240 Minutes Intravenous 3 times per day 03/26/12  0116 03/28/12 1618   03/26/12 0130  piperacillin-tazobactam (ZOSYN) IVPB 3.375 g       3.375 g 100 mL/hr over 30 Minutes Intravenous  Once 03/26/12 0109 03/26/12 0402   03/26/12 0115   piperacillin-tazobactam (ZOSYN) IVPB 3.375 g  Status:  Discontinued        3.375 g 12.5 mL/hr over 240 Minutes Intravenous 3 times per day 03/26/12 0115 03/26/12 0116   03/25/12 2300   vancomycin (VANCOCIN) 750 mg in sodium chloride 0.9 % 150 mL IVPB  Status:  Discontinued        750 mg 150 mL/hr over 60 Minutes Intravenous Every 24 hours 03/25/12 2240 03/27/12 1135   03/25/12 2300   ceFEPIme (MAXIPIME) 1 g in dextrose 5 % 50 mL IVPB  Status:  Discontinued        1 g 100 mL/hr over 30 Minutes Intravenous Every 24 hours 03/25/12 2240 03/26/12 0108   03/25/12 1630   cefTRIAXone (ROCEPHIN) 1 g in dextrose 5 % 50 mL IVPB  Status:  Discontinued        1 g 100 mL/hr over 30 Minutes Intravenous Every 24 hours 03/25/12 1619 03/25/12 2225          Results for orders placed during the hospital encounter of 03/24/12 (from the past 48 hour(s))  GLUCOSE, CAPILLARY     Status: Abnormal   Collection Time   03/29/12  8:07 PM      Component Value Range Comment   Glucose-Capillary 166 (*) 70 - 99 mg/dL   GLUCOSE, CAPILLARY     Status: Abnormal   Collection Time   03/29/12 11:45 PM      Component Value Range Comment    Glucose-Capillary 191 (*) 70 - 99 mg/dL    Comment 1 Notify RN     GLUCOSE, CAPILLARY     Status: Abnormal   Collection Time   03/30/12  4:15 AM      Component Value Range Comment   Glucose-Capillary 186 (*) 70 - 99 mg/dL    Comment 1 Notify RN     BASIC METABOLIC PANEL     Status: Abnormal   Collection Time   03/30/12  4:24 AM      Component Value Range Comment   Sodium 141  135 - 145 mEq/L    Potassium 2.9 (*) 3.5 - 5.1 mEq/L    Chloride 109  96 - 112 mEq/L    CO2 25  19 - 32 mEq/L    Glucose, Bld 202 (*) 70 - 99 mg/dL    BUN 36 (*) 6 - 23 mg/dL    Creatinine, Ser 1.61  0.50 - 1.10 mg/dL    Calcium 9.0  8.4 - 09.6 mg/dL    GFR calc non Af Amer >90  >90 mL/min    GFR calc Af Amer >90  >90 mL/min   CBC WITH DIFFERENTIAL     Status: Abnormal   Collection Time   03/30/12  4:24 AM      Component Value Range Comment   WBC 19.0 (*) 4.0 - 10.5 K/uL    RBC 3.22 (*) 3.87 - 5.11 MIL/uL    Hemoglobin 10.0 (*) 12.0 - 15.0 g/dL    HCT 04.5 (*) 40.9 - 46.0 %    MCV 91.0  78.0 - 100.0 fL    MCH 31.1  26.0 - 34.0 pg    MCHC 34.1  30.0 - 36.0 g/dL    RDW 81.1  91.4 - 78.2 %  Platelets 45 (*) 150 - 400 K/uL    Neutrophils Relative 86 (*) 43 - 77 %    Lymphocytes Relative 3 (*) 12 - 46 %    Monocytes Relative 11  3 - 12 %    Eosinophils Relative 0  0 - 5 %    Basophils Relative 0  0 - 1 %    Neutro Abs 16.3 (*) 1.7 - 7.7 K/uL    Lymphs Abs 0.6 (*) 0.7 - 4.0 K/uL    Monocytes Absolute 2.1 (*) 0.1 - 1.0 K/uL    Eosinophils Absolute 0.0  0.0 - 0.7 K/uL    Basophils Absolute 0.0  0.0 - 0.1 K/uL    WBC Morphology TOXIC GRANULATION     GLUCOSE, CAPILLARY     Status: Abnormal   Collection Time   03/30/12  7:19 AM      Component Value Range Comment   Glucose-Capillary 182 (*) 70 - 99 mg/dL   GLUCOSE, CAPILLARY     Status: Abnormal   Collection Time   03/30/12 11:49 AM      Component Value Range Comment   Glucose-Capillary 166 (*) 70 - 99 mg/dL   GLUCOSE, CAPILLARY     Status: Abnormal    Collection Time   03/30/12  3:55 PM      Component Value Range Comment   Glucose-Capillary 131 (*) 70 - 99 mg/dL    Comment 1 Documented in Chart      Comment 2 Notify RN     GLUCOSE, CAPILLARY     Status: Abnormal   Collection Time   03/30/12  7:48 PM      Component Value Range Comment   Glucose-Capillary 114 (*) 70 - 99 mg/dL    Comment 1 Notify RN      Comment 2 Documented in Chart     VANCOMYCIN, TROUGH     Status: Normal   Collection Time   03/30/12 11:00 PM      Component Value Range Comment   Vancomycin Tr 12.8  10.0 - 20.0 ug/mL   GLUCOSE, CAPILLARY     Status: Abnormal   Collection Time   03/31/12 12:10 AM      Component Value Range Comment   Glucose-Capillary 131 (*) 70 - 99 mg/dL   GLUCOSE, CAPILLARY     Status: Abnormal   Collection Time   03/31/12  4:14 AM      Component Value Range Comment   Glucose-Capillary 111 (*) 70 - 99 mg/dL   COMPREHENSIVE METABOLIC PANEL     Status: Abnormal   Collection Time   03/31/12  4:19 AM      Component Value Range Comment   Sodium 149 (*) 135 - 145 mEq/L DELTA CHECK NOTED   Potassium 3.4 (*) 3.5 - 5.1 mEq/L    Chloride 116 (*) 96 - 112 mEq/L    CO2 27  19 - 32 mEq/L    Glucose, Bld 125 (*) 70 - 99 mg/dL    BUN 26 (*) 6 - 23 mg/dL    Creatinine, Ser 4.54 (*) 0.50 - 1.10 mg/dL    Calcium 9.2  8.4 - 09.8 mg/dL    Total Protein 4.7 (*) 6.0 - 8.3 g/dL    Albumin 2.1 (*) 3.5 - 5.2 g/dL    AST 18  0 - 37 U/L    ALT 49 (*) 0 - 35 U/L    Alkaline Phosphatase 140 (*) 39 - 117 U/L    Total Bilirubin 2.0 (*)  0.3 - 1.2 mg/dL    GFR calc non Af Amer >90  >90 mL/min    GFR calc Af Amer >90  >90 mL/min   MAGNESIUM     Status: Normal   Collection Time   03/31/12  4:19 AM      Component Value Range Comment   Magnesium 2.1  1.5 - 2.5 mg/dL   PHOSPHORUS     Status: Abnormal   Collection Time   03/31/12  4:19 AM      Component Value Range Comment   Phosphorus 1.2 (*) 2.3 - 4.6 mg/dL   CBC WITH DIFFERENTIAL     Status: Abnormal   Collection  Time   03/31/12  4:19 AM      Component Value Range Comment   WBC 22.3 (*) 4.0 - 10.5 K/uL WHITE COUNT CONFIRMED ON SMEAR   RBC 3.30 (*) 3.87 - 5.11 MIL/uL    Hemoglobin 10.4 (*) 12.0 - 15.0 g/dL    HCT 16.1 (*) 09.6 - 46.0 %    MCV 92.1  78.0 - 100.0 fL    MCH 31.5  26.0 - 34.0 pg    MCHC 34.2  30.0 - 36.0 g/dL    RDW 04.5  40.9 - 81.1 %    Platelets 68 (*) 150 - 400 K/uL    Neutrophils Relative 81 (*) 43 - 77 %    Lymphocytes Relative 5 (*) 12 - 46 %    Monocytes Relative 14 (*) 3 - 12 %    Eosinophils Relative 0  0 - 5 %    Basophils Relative 0  0 - 1 %    Neutro Abs 18.1 (*) 1.7 - 7.7 K/uL    Lymphs Abs 1.1  0.7 - 4.0 K/uL    Monocytes Absolute 3.1 (*) 0.1 - 1.0 K/uL    Eosinophils Absolute 0.0  0.0 - 0.7 K/uL    Basophils Absolute 0.0  0.0 - 0.1 K/uL    WBC Morphology TOXIC GRANULATION   MILD LEFT SHIFT (1-5% METAS, OCC MYELO, OCC BANDS)   Smear Review PLATELETS APPEAR DECREASED     GLUCOSE, CAPILLARY     Status: Abnormal   Collection Time   03/31/12  7:45 AM      Component Value Range Comment   Glucose-Capillary 114 (*) 70 - 99 mg/dL    Comment 1 Documented in Chart      Comment 2 Notify RN     GLUCOSE, CAPILLARY     Status: Abnormal   Collection Time   03/31/12 11:31 AM      Component Value Range Comment   Glucose-Capillary 119 (*) 70 - 99 mg/dL    Comment 1 Documented in Chart      Comment 2 Notify RN     GLUCOSE, CAPILLARY     Status: Abnormal   Collection Time   03/31/12  3:50 PM      Component Value Range Comment   Glucose-Capillary 162 (*) 70 - 99 mg/dL    Comment 1 Notify RN      Comment 2 Documented in Chart       Ir Cholan Exist Tube  03/31/2012  *RADIOLOGY REPORT*  Clinical history:71 year old with obstructive jaundice.  The patient currently has an external biliary drain.  PROCEDURE(S): CHOLANGIOGRAM THROUGH EXISTING TUBE; PLACEMENT OF INTERNAL EXTERNAL BILIARY DRAIN  Physician: Rachelle Hora. Henn, MD  Medications:Versed 2 mg IV  Fluoroscopy time: 5.5 minutes   Contrast:  25 ml Omnipaque-300  Procedure:Informed consent was obtained  for a cholangiogram and internalization of biliary drain.  The patient was also consented for a possible brush biopsy.  The patient was placed supine on the interventional table.  The existing biliary drain was prepped and draped in a sterile fashion.  Maximal barrier sterile technique was utilized including caps, mask, sterile gowns, sterile gloves, sterile drape, hand hygiene and skin antiseptic.  Contrast was injected into the biliary drain.  The biliary catheter was cut and removed over a Bentson wire.  5-French Kumpe catheter was used to perform additional cholangiograms.  Catheter was also advanced into the duodenum.  A Bentson wire was placed in the small bowel and a 10-French internal-external biliary drain was placed over a Bentson wire.  The distal aspect of the drain was reconstituted in the small bowel.  Catheter sutured to the skin and attached to a gravity bag.  Findings:The cholangiogram demonstrated an external biliary drain. The patient's anatomy is very unusual in that the common bile duct extends cephalad and is left of midline.  Cholangiogram demonstrated a very large filling defect in the distal common bile duct consistent with a large stone.  There are additional filling defects throughout the biliary system suggestive for additional stones.  The ampulla is widely patent and contrast is now spontaneously draining into the small bowel.  An underlying stricture or lesion was not identified.  Complications: None  Impression:  Successful placement of an internal-external biliary drain.  The cause of the obstructive jaundice is due to biliary stones. There was a very large stone in the distal common bile duct which was mobile throughout the procedure.  Plan:  Plan internal-external biliary drainage until the patient has fully recovered from sepsis and respiratory distress.  Will need GI's opinion about whether ERCP and stone  extraction are possible due to the patient's abnormal anatomy.  Otherwise, the patient may be a candidate for percutaneous sphincteroplasty and stone removal.   Original Report Authenticated By: Richarda Overlie, M.D.    Ir Biliary Drain Catheter Placement  03/31/2012  *RADIOLOGY REPORT*  Clinical history:71 year old with obstructive jaundice.  The patient currently has an external biliary drain.  PROCEDURE(S): CHOLANGIOGRAM THROUGH EXISTING TUBE; PLACEMENT OF INTERNAL EXTERNAL BILIARY DRAIN  Physician: Rachelle Hora. Henn, MD  Medications:Versed 2 mg IV  Fluoroscopy time: 5.5 minutes  Contrast:  25 ml Omnipaque-300  Procedure:Informed consent was obtained for a cholangiogram and internalization of biliary drain.  The patient was also consented for a possible brush biopsy.  The patient was placed supine on the interventional table.  The existing biliary drain was prepped and draped in a sterile fashion.  Maximal barrier sterile technique was utilized including caps, mask, sterile gowns, sterile gloves, sterile drape, hand hygiene and skin antiseptic.  Contrast was injected into the biliary drain.  The biliary catheter was cut and removed over a Bentson wire.  5-French Kumpe catheter was used to perform additional cholangiograms.  Catheter was also advanced into the duodenum.  A Bentson wire was placed in the small bowel and a 10-French internal-external biliary drain was placed over a Bentson wire.  The distal aspect of the drain was reconstituted in the small bowel.  Catheter sutured to the skin and attached to a gravity bag.  Findings:The cholangiogram demonstrated an external biliary drain. The patient's anatomy is very unusual in that the common bile duct extends cephalad and is left of midline.  Cholangiogram demonstrated a very large filling defect in the distal common bile duct consistent with a large stone.  There  are additional filling defects throughout the biliary system suggestive for additional stones.  The  ampulla is widely patent and contrast is now spontaneously draining into the small bowel.  An underlying stricture or lesion was not identified.  Complications: None  Impression:  Successful placement of an internal-external biliary drain.  The cause of the obstructive jaundice is due to biliary stones. There was a very large stone in the distal common bile duct which was mobile throughout the procedure.  Plan:  Plan internal-external biliary drainage until the patient has fully recovered from sepsis and respiratory distress.  Will need GI's opinion about whether ERCP and stone extraction are possible due to the patient's abnormal anatomy.  Otherwise, the patient may be a candidate for percutaneous sphincteroplasty and stone removal.   Original Report Authenticated By: Richarda Overlie, M.D.    Dg Chest Port 1 View  03/31/2012  *RADIOLOGY REPORT*  Clinical Data: Sepsis  PORTABLE CHEST - 1 VIEW  Comparison: 03/29/2012  Findings: Endotracheal tube 2.2 cm above the carina.  Other support apparatus stable.  Bilateral pleural effusions persist with bibasilar airspace disease/consolidation.  Large hiatal hernia noted with left hemidiaphragm elevation as before.  Stable heart size and vascularity.  No pneumothorax.  No significant interval change.  IMPRESSION: Stable portable chest exam   Original Report Authenticated By: Judie Petit. Ruel Favors, M.D.     Review of Systems  Unable to perform ROS: intubated   Blood pressure 156/83, pulse 89, temperature 98.1 F (36.7 C), temperature source Oral, resp. rate 15, height 5\' 1"  (1.549 m), weight 122 lb 5.7 oz (55.5 kg), SpO2 97.00%. Physical Exam  Constitutional: No distress.       Frail elderly woman on the Vent.  OG tube in place  HENT:  Head: Normocephalic and atraumatic.  Nose: Nose normal.       Intubated with NG placed orally with tube feeds in place  Eyes: Conjunctivae and EOM are normal. Pupils are equal, round, and reactive to light. Right eye exhibits no discharge.  Left eye exhibits no discharge. No scleral icterus.  Neck: Normal range of motion. Neck supple. No JVD present. No tracheal deviation present. No thyromegaly present.  Cardiovascular: Normal rate, regular rhythm and intact distal pulses.  Exam reveals no gallop.   No murmur heard. Respiratory: No respiratory distress. She has no wheezes. She has no rales. She exhibits no tenderness.       On vent, anterior breath sounds are clear.  GI: Soft. Bowel sounds are normal. She exhibits no distension and no mass. There is no tenderness. There is no rebound and no guarding.       She has had several surgeries with scars well healed.  Musculoskeletal: She exhibits no edema and no tenderness.  Lymphadenopathy:    She has no cervical adenopathy.  Neurological: She is alert. No cranial nerve deficit.       Currently on Vent watching TV with family in the room.  Skin: Skin is warm and dry. No rash noted. She is not diaphoretic. No erythema.  Psychiatric: She has a normal mood and affect. Her behavior is normal.       On Vent    Assessment/Plan: 1. Cholangitis with obstructive jaundice due to biliary stones in the distal common bile duct. 2. Hiatal hernia with displacement of the stomach, pancreas and duodenum. 3. Status post placement of the internal-external biliary drain. 4. Status post 2 prior surgeries for hiatal hernia 5. History of coronary disease 6. History of congestive  heart failure 7. History of COPD/patient reports she was never a smoker/currently on ventilator. 8. History of chronic pain/anxiety/depression.  Plan:  Dr. Janee Morn is seen the patient and reviewed the findings of interventional radiology with Dr. Lowella Dandy.  At this point the recommendation is she be allowed to recover completely from her sepsis and respiratory distress. GI will see and discuss whether ERCP with stone extraction are possible. If not she could possibly be a candidate for percutaneous sphincteroplasty and stone  removal by IR. We'll follow with you. Will [facility] physician assistant for Dr. Violeta Gelinas. Rockwell Zentz 03/31/2012, 4:57 PM

## 2012-03-31 NOTE — Progress Notes (Signed)
eLink Physician-Brief Progress Note Patient Name: Laurie Sparks DOB: 09-Mar-1941 MRN: [id number]  Date of Service  03/31/2012   HPI/Events of Note  IR findings noted and discussed with RN. Will likely need surgical intervention - [facility] consult requested.  Micro results reviewed. BCs positive for E coli and Enterococcus which is only intermediately sens to Vanc.    eICU Interventions  [facility] Consult requested  Simplify abx to Zosyn which will cover enterococcus more effectively. E coli was sens to [employer] 03/31/2012, 4:00 PM

## 2012-03-31 NOTE — Progress Notes (Signed)
ANTIBIOTIC CONSULT NOTE - Follow-Up  Pharmacy Consult for Vancomycin   Indication: rule out sepsis/UTI/abdominal process  Allergies  Allergen Reactions  . Ambien (Zolpidem Tartrate) Other (See Comments)    Hallucinate   . Other     Base metal and something else    Labs:  Basename 03/30/12 0424 03/29/12 0345 03/28/12 2100 03/28/12 0415  WBC 19.0* 14.3* -- 23.0*  HGB 10.0* 8.9* -- 9.8*  PLT 45* 23* 20* --  LABCREA -- -- -- --  CREATININE 0.55 0.56 -- 0.55   Estimated Creatinine Clearance: 48.7 ml/min (by C-G formula based on Cr of 0.55).  Basename 03/30/12 2300  VANCOTROUGH 12.8  VANCOPEAK --  VANCORANDOM --  GENTTROUGH --  GENTPEAK --  GENTRANDOM --  TOBRATROUGH --  TOBRAPEAK --  TOBRARND --  AMIKACINPEAK --  AMIKACINTROU --  AMIKACIN --     Microbiology: Recent Results (from the past 720 hour(s))  URINE CULTURE     Status: Normal   Collection Time   03/25/12  3:28 PM      Component Value Range Status Comment   Specimen Description URINE, RANDOM   Final    Special Requests NONE   Final    Culture  Setup Time 03/25/2012 15:54   Final    Colony Count 65,000 COLONIES/ML   Final    Culture     Final    Value: STAPHYLOCOCCUS SPECIES (COAGULASE NEGATIVE)     Note: RIFAMPIN AND GENTAMICIN SHOULD NOT BE USED AS SINGLE DRUGS FOR TREATMENT OF STAPH INFECTIONS.   Report Status 03/28/2012 FINAL   Final    Organism ID, Bacteria STAPHYLOCOCCUS SPECIES (COAGULASE NEGATIVE)   Final   CULTURE, BLOOD (ROUTINE X 2)     Status: Normal (Preliminary result)   Collection Time   03/25/12  3:55 PM      Component Value Range Status Comment   Specimen Description BLOOD LEFT HAND   Final    Special Requests BOTTLES DRAWN AEROBIC ONLY 5CC   Final    Culture  Setup Time 03/25/2012 20:33   Final    Culture     Final    Value: ESCHERICHIA COLI     ENTEROCOCCUS SPECIES     Note: Gram Stain Report Called to,Read Back By and Verified With: CARRIE H @1021  03/26/12 BY KRAWS   Report Status  PENDING   Incomplete    Organism ID, Bacteria ESCHERICHIA COLI   Final   CULTURE, BLOOD (ROUTINE X 2)     Status: Normal (Preliminary result)   Collection Time   03/25/12  4:00 PM      Component Value Range Status Comment   Specimen Description BLOOD LEFT ARM   Final    Special Requests BOTTLES DRAWN AEROBIC ONLY 5CC   Final    Culture  Setup Time 03/25/2012 20:33   Final    Culture     Final    Value: ESCHERICHIA COLI     Note: SUSCEPTIBILITIES PERFORMED ON PREVIOUS CULTURE WITHIN THE LAST 5 DAYS.     GRAM POSITIVE COCCI IN CHAINS     Note: Gram Stain Report Called to,Read Back By and Verified With: CARRIE H @1021  03/26/12 BY KRAWS   Report Status PENDING   Incomplete   MRSA PCR SCREENING     Status: Normal   Collection Time   03/25/12  9:10 PM      Component Value Range Status Comment   MRSA by PCR NEGATIVE  NEGATIVE Final   ANAEROBIC CULTURE  Status: Normal (Preliminary result)   Collection Time   03/27/12 12:55 PM      Component Value Range Status Comment   Specimen Description FLUID BILE   Final    Special Requests NONE   Final    Gram Stain     Final    Value: NO WBC SEEN     NO ORGANISMS SEEN   Culture     Final    Value: NO ANAEROBES ISOLATED; CULTURE IN PROGRESS FOR 5 DAYS   Report Status PENDING   Incomplete   BODY FLUID CULTURE     Status: Normal (Preliminary result)   Collection Time   03/27/12 12:55 PM      Component Value Range Status Comment   Specimen Description FLUID BILE   Final    Special Requests NONE   Final    Gram Stain     Final    Value: NO WBC SEEN     NO ORGANISMS SEEN   Culture     Final    Value: ABUNDANT ESCHERICHIA COLI     MODERATE ENTEROCOCCUS SPECIES   Report Status PENDING   Incomplete    Organism ID, Bacteria ESCHERICHIA COLI   Final   VRE CULTURE     Status: Normal (Preliminary result)   Collection Time   03/29/12  5:16 AM      Component Value Range Status Comment   Specimen Description STOOL   Final    Special Requests NONE    Final    Culture Culture reincubated for better growth   Final    Report Status PENDING   Incomplete     Medical History: Past Medical History  Diagnosis Date  . Coronary artery disease   . Myocardial infarct   . CHF (congestive heart failure)     Medications:  Prescriptions prior to admission  Medication Sig Dispense Refill  . aspirin EC 81 MG tablet Take 81 mg by mouth daily.      . carvedilol (COREG) 12.5 MG tablet Take 12.5 mg by mouth 2 (two) times daily with a meal.      . HYDROcodone-acetaminophen (NORCO) 7.5-325 MG per tablet Take 1 tablet by mouth 3 (three) times daily.      . nitroGLYCERIN (NITROSTAT) 0.4 MG SL tablet Place 0.4 mg under the tongue every 5 (five) minutes as needed. For chest pain       Assessment: 71 yo female who developed hypotension, tachycardia, and worsening hypoxemic respiratory failure on the first day of admission here with probable UTI, sepsis, and abdominal process. Vancomycin trough is very slightly subtherapeutic at 12.8.  Blood x2 8/14 E. Coli, GPC in chains. Afebrile, WBC trending down, renal function stable, CrCl ~45-50 ml/min.   Cefepime 8/14 (1 day) Vancomycin 8/14 >> Fortaz/Flagyl 8/17>>    Goal of Therapy:  Vancomycin trough level 15-20 mcg/ml  Plan:  1. Increase vancomycin to 1250mg  IV Q24H 2. Check a trough at steady state  Lysle Pearl, PharmD, [place] Pager # [phone] 03/31/2012 12:09 AM

## 2012-04-01 ENCOUNTER — Inpatient Hospital Stay (HOSPITAL_COMMUNITY): Payer: Medicare Other

## 2012-04-01 LAB — BASIC METABOLIC PANEL
BUN: 18 mg/dL (ref 6–23)
CO2: 32 mEq/L (ref 19–32)
Chloride: 110 mEq/L (ref 96–112)
Creatinine, Ser: 0.43 mg/dL — ABNORMAL LOW (ref 0.50–1.10)
GFR calc Af Amer: >90 mL/min (ref >90)
Glucose, Bld: 130 mg/dL — ABNORMAL HIGH (ref 70–99)
Potassium: 3.2 mEq/L — ABNORMAL LOW (ref 3.5–5.1)

## 2012-04-01 LAB — CBC
Hemoglobin: 9.2 g/dL — ABNORMAL LOW (ref 12.0–15.0)
MCHC: 33.6 g/dL (ref 30.0–36.0)
Platelets: 96 10*3/uL — ABNORMAL LOW (ref 150–400)

## 2012-04-01 LAB — GLUCOSE, CAPILLARY
Glucose-Capillary: 141 mg/dL — ABNORMAL HIGH (ref 70–99)
Glucose-Capillary: 134 mg/dL — ABNORMAL HIGH (ref 70–99)
Glucose-Capillary: 143 mg/dL — ABNORMAL HIGH (ref 70–99)

## 2012-04-01 LAB — CULTURE, BLOOD (ROUTINE X 2)

## 2012-04-01 LAB — PHOSPHORUS: Phosphorus: 2.9 mg/dL (ref 2.3–4.6)

## 2012-04-01 LAB — MAGNESIUM: Magnesium: 1.9 mg/dL (ref 1.5–2.5)

## 2012-04-01 LAB — ANAEROBIC CULTURE

## 2012-04-01 MED ORDER — FUROSEMIDE 10 MG/ML IJ SOLN
INTRAMUSCULAR | Status: AC
  Filled 2012-04-01: qty 4

## 2012-04-01 MED ORDER — POTASSIUM CHLORIDE 20 MEQ/15ML (10%) PO LIQD
60.0000 meq | Freq: Once | ORAL | Status: AC
  Administered 2012-04-01: 60 meq
  Filled 2012-04-01: qty 45

## 2012-04-01 MED ORDER — GERHARDT'S BUTT CREAM
TOPICAL_CREAM | Freq: Every day | CUTANEOUS | Status: DC
  Administered 2012-04-01 – 2012-04-05 (×5): via TOPICAL
  Administered 2012-04-06: 1 via TOPICAL
  Administered 2012-04-07 – 2012-04-08 (×2): via TOPICAL
  Filled 2012-04-01: qty 1

## 2012-04-01 MED ORDER — MAGNESIUM SULFATE 50 % IJ SOLN
2.0000 g | Freq: Once | INTRAVENOUS | Status: AC
  Administered 2012-04-01: 2 g via INTRAVENOUS
  Filled 2012-04-01: qty 4

## 2012-04-01 MED ORDER — POTASSIUM CHLORIDE 20 MEQ/15ML (10%) PO LIQD
20.0000 meq | Freq: Three times a day (TID) | ORAL | Status: DC
  Administered 2012-04-01 – 2012-04-02 (×6): 20 meq
  Filled 2012-04-01 (×11): qty 15

## 2012-04-01 MED ORDER — METOCLOPRAMIDE HCL 5 MG/ML IJ SOLN
5.0000 mg | Freq: Three times a day (TID) | INTRAMUSCULAR | Status: DC | PRN
  Administered 2012-04-01 – 2012-04-02 (×2): 5 mg via INTRAVENOUS
  Filled 2012-04-01 (×3): qty 1

## 2012-04-01 MED ORDER — FUROSEMIDE 10 MG/ML IJ SOLN
20.0000 mg | Freq: Three times a day (TID) | INTRAMUSCULAR | Status: DC
  Administered 2012-04-01 – 2012-04-03 (×4): 20 mg via INTRAVENOUS
  Filled 2012-04-01 (×8): qty 2

## 2012-04-01 NOTE — Progress Notes (Signed)
Subjective: Biliary drain placed 8/16 Cholangiogram and I/E bili drain 8/20 Obstruction secondary to Biliary stones Draining well Pt alert/ on vent  Objective: Vital signs in last 24 hours: Temp:  [98 F (36.7 C)-99.1 F (37.3 C)] 98.9 F (37.2 C) (08/21 1148) Pulse Rate:  [77-99] 96  (08/21 1112) Resp:  [14-24] 17  (08/21 1112) BP: (105-171)/(55-86) 125/60 mmHg (08/21 1112) SpO2:  [97 %-100 %] 100 % (08/21 1145) FiO2 (%):  [30 %-40.3 %] 40 % (08/21 1145) Weight:  [113 lb 5.1 oz (51.4 kg)] 113 lb 5.1 oz (51.4 kg) (08/21 0600) Last BM Date: 04/01/12  Intake/Output from previous day: 08/20 0701 - 08/21 0700 In: 2143 [I.V.:140; NG/GT:1050; IV Piggyback:953] Out: 3355 [Urine:2520; Drains:835] Intake/Output this shift: Total I/O In: 292.5 [I.V.:60; NG/GT:195; IV Piggyback:37.5] Out: 137 [Urine:135; Stool:2]  PE: on vent Afeb; vss Wbc trending down Alert; communicating to friends in room by writing Output 835 cc yesterday - bile 200 cc in bag now Site clean and dry; NT  Lab Results:   [facility] 04/01/12 0415 03/31/12 0419  WBC 19.1* 22.3*  HGB 9.2* 10.4*  HCT 27.4* 30.4*  PLT 96* 68*   BMET  Basename 04/01/12 0415 03/31/12 0419  NA 148* 149*  K 3.2* 3.4*  CL 110 116*  CO2 32 27  GLUCOSE 130* 125*  BUN 18 26*  CREATININE 0.43* 0.44*  CALCIUM 8.3* 9.2   PT/INR No results found for this basename: LABPROT:2,INR:2 in the last 72 hours ABG No results found for this basename: PHART:2,PCO2:2,PO2:2,HCO3:2 in the last 72 hours  Studies/Results: Ir Cholan Exist Tube  03/31/2012  *RADIOLOGY REPORT*  Clinical history:71 year old with obstructive jaundice.  The patient currently has an external biliary drain.  PROCEDURE(S): CHOLANGIOGRAM THROUGH EXISTING TUBE; PLACEMENT OF INTERNAL EXTERNAL BILIARY DRAIN  Physician: Rachelle Hora. Henn, MD  Medications:Versed 2 mg IV  Fluoroscopy time: 5.5 minutes  Contrast:  25 ml Omnipaque-300  Procedure:Informed consent was obtained for a  cholangiogram and internalization of biliary drain.  The patient was also consented for a possible brush biopsy.  The patient was placed supine on the interventional table.  The existing biliary drain was prepped and draped in a sterile fashion.  Maximal barrier sterile technique was utilized including caps, mask, sterile gowns, sterile gloves, sterile drape, hand hygiene and skin antiseptic.  Contrast was injected into the biliary drain.  The biliary catheter was cut and removed over a Bentson wire.  5-French Kumpe catheter was used to perform additional cholangiograms.  Catheter was also advanced into the duodenum.  A Bentson wire was placed in the small bowel and a 10-French internal-external biliary drain was placed over a Bentson wire.  The distal aspect of the drain was reconstituted in the small bowel.  Catheter sutured to the skin and attached to a gravity bag.  Findings:The cholangiogram demonstrated an external biliary drain. The patient's anatomy is very unusual in that the common bile duct extends cephalad and is left of midline.  Cholangiogram demonstrated a very large filling defect in the distal common bile duct consistent with a large stone.  There are additional filling defects throughout the biliary system suggestive for additional stones.  The ampulla is widely patent and contrast is now spontaneously draining into the small bowel.  An underlying stricture or lesion was not identified.  Complications: None  Impression:  Successful placement of an internal-external biliary drain.  The cause of the obstructive jaundice is due to biliary stones. There was a very large stone in the  distal common bile duct which was mobile throughout the procedure.  Plan:  Plan internal-external biliary drainage until the patient has fully recovered from sepsis and respiratory distress.  Will need GI's opinion about whether ERCP and stone extraction are possible due to the patient's abnormal anatomy.  Otherwise, the  patient may be a candidate for percutaneous sphincteroplasty and stone removal.   Original Report Authenticated By: Richarda Overlie, M.D.    Ir Biliary Drain Catheter Placement  03/31/2012  *RADIOLOGY REPORT*  Clinical history:71 year old with obstructive jaundice.  The patient currently has an external biliary drain.  PROCEDURE(S): CHOLANGIOGRAM THROUGH EXISTING TUBE; PLACEMENT OF INTERNAL EXTERNAL BILIARY DRAIN  Physician: Rachelle Hora. Henn, MD  Medications:Versed 2 mg IV  Fluoroscopy time: 5.5 minutes  Contrast:  25 ml Omnipaque-300  Procedure:Informed consent was obtained for a cholangiogram and internalization of biliary drain.  The patient was also consented for a possible brush biopsy.  The patient was placed supine on the interventional table.  The existing biliary drain was prepped and draped in a sterile fashion.  Maximal barrier sterile technique was utilized including caps, mask, sterile gowns, sterile gloves, sterile drape, hand hygiene and skin antiseptic.  Contrast was injected into the biliary drain.  The biliary catheter was cut and removed over a Bentson wire.  5-French Kumpe catheter was used to perform additional cholangiograms.  Catheter was also advanced into the duodenum.  A Bentson wire was placed in the small bowel and a 10-French internal-external biliary drain was placed over a Bentson wire.  The distal aspect of the drain was reconstituted in the small bowel.  Catheter sutured to the skin and attached to a gravity bag.  Findings:The cholangiogram demonstrated an external biliary drain. The patient's anatomy is very unusual in that the common bile duct extends cephalad and is left of midline.  Cholangiogram demonstrated a very large filling defect in the distal common bile duct consistent with a large stone.  There are additional filling defects throughout the biliary system suggestive for additional stones.  The ampulla is widely patent and contrast is now spontaneously draining into the small  bowel.  An underlying stricture or lesion was not identified.  Complications: None  Impression:  Successful placement of an internal-external biliary drain.  The cause of the obstructive jaundice is due to biliary stones. There was a very large stone in the distal common bile duct which was mobile throughout the procedure.  Plan:  Plan internal-external biliary drainage until the patient has fully recovered from sepsis and respiratory distress.  Will need GI's opinion about whether ERCP and stone extraction are possible due to the patient's abnormal anatomy.  Otherwise, the patient may be a candidate for percutaneous sphincteroplasty and stone removal.   Original Report Authenticated By: Richarda Overlie, M.D.    Dg Chest Port 1 View  04/01/2012  *RADIOLOGY REPORT*  Clinical Data: Evaluate ET tube placement  PORTABLE CHEST - 1 VIEW  Comparison: 03/31/2012  Findings: The endotracheal tube tip is above the carina.  There is a right IJ catheter with tip in the projection of the SVC. Bilateral pleural effusions are unchanged. There is a mild interstitial edema.  Large hiatal hernia is again noted.  IMPRESSION:  1.  No change in position of ET tube. 2.  Persistent pleural effusions and mild interstitial edema.   Original Report Authenticated By: Rosealee Albee, M.D.    Dg Chest Port 1 View  03/31/2012  *RADIOLOGY REPORT*  Clinical Data: Sepsis  PORTABLE CHEST - 1  VIEW  Comparison: 03/29/2012  Findings: Endotracheal tube 2.2 cm above the carina.  Other support apparatus stable.  Bilateral pleural effusions persist with bibasilar airspace disease/consolidation.  Large hiatal hernia noted with left hemidiaphragm elevation as before.  Stable heart size and vascularity.  No pneumothorax.  No significant interval change.  IMPRESSION: Stable portable chest exam   Original Report Authenticated By: Judie Petit. Ruel Favors, M.D.     Anti-infectives:   Assessment/Plan: s/p * No surgery found *  Biliary stones;  obstruction; Internal/External bili drain in now Rad discussing with GI as to next plan  Reema Chick A 04/01/2012

## 2012-04-01 NOTE — Consult Note (Signed)
Continue drain to allow sepsis to resolve.  Once well, GI or IR approach to clear large CBD stones. Patient examined and I agree with the assessment and plan  Violeta Gelinas, MD, MPH, FACS Pager: [phone]  04/01/2012 6:43 AM

## 2012-04-01 NOTE — Progress Notes (Signed)
Name: Laurie Sparks MRN: [id number] DOB: [birth year]    LOS: 8 days  Referring Provider:  Maryan Puls Reason for Referral:  Shock, hypoxemia  PULMONARY / CRITICAL CARE MEDICINE   Brief patient description:  71 y/o female with CHF and CAD admitted 8/14 with chest pain developed hypotension, tachycardia and worsening hypoxemic respiratory failure likely in the setting of sepsis likely due to cholangitis vs. Less likely SBO/abdominal process.  Events Since Admission: 8/15 >> s/p CT scan abd, CBD obstruction with probable mass 8/16 >> IR guided biliary (CBD) drain 8/17: Phenylephrine off, opens eyes to voice. Gi signed off 03/30/2012: Off pressors. Still on sedation gtt. Normal WUA. Starting SBT.  03/31/12: IR procedure - dx is gall stones. CCS consult - recommending GI guided ERCP sphinctertom +/- stent as first step and if fails/not possible IR guided approach as next step (CCS feels complicated anantomy for surgical cholecystectomy)      -  :  Successful placement of an internal-external biliary drain.  The cause of the obstructive jaundice is due to biliary stones. There was a very large stone in the distal common bile duct which was mobile throughout the procedure.  Plan:  Plan internal-external biliary drainage until the patient has fully recovered from sepsis and respiratory distress.  Will need GI's opinion about whether ERCP and stone extraction are possible due to the patient's abnormal anatomy.  Otherwise, the patient may be a candidate for percutaneous sphincteroplasty and stone removal.   Original Report Authenticated By: Richarda Overlie, M.D.      Current Status: 04/01/12: Did well on 10/5 PSV.  Failed on 5/5 but repeating 5/5 PSV again   Vital Signs: Temp:  [98 F (36.7 C)-99.1 F (37.3 C)] 99.1 F (37.3 C) (08/21 0755) Pulse Rate:  [77-99] 91  (08/21 0900) Resp:  [14-24] 16  (08/21 0900) BP: (105-200)/(55-94) 120/65 mmHg (08/21 0900) SpO2:  [97 %-100 %] 98 % (08/21 0900) FiO2 (%):   [30 %-40.3 %] 40.1 % (08/21 0900) Weight:  [51.4 kg (113 lb 5.1 oz)] 51.4 kg (113 lb 5.1 oz) (08/21 0600)  Intake/Output Summary (Last 24 hours) at 04/01/12 0949 Last data filed at 04/01/12 0900  Gross per 24 hour  Intake   2108 ml  Output   3280 ml  Net  -1172 ml    Physical Examination: Gen: chronically ill and critically ill appearing, intubated HEENT: NCAT, PERRL, EOMi, ETT in place PULM: Insp crackles in bases bilaterally.  CVS rrr, no mgr,  AB: BS infrequent, soft, less tender on my exam today Ext: warm, no edema, no clubbing, no cyanosis Derm: no rash or skin breakdown per RN Neuro: RASS 0. CAM-ICU negative for delirium    Ir Cholan Exist Tube  03/31/2012  *RADIOLOGY REPORT*  Clinical history:71 year old with obstructive jaundice.  The patient currently has an external biliary drain.  PROCEDURE(S): CHOLANGIOGRAM THROUGH EXISTING TUBE; PLACEMENT OF INTERNAL EXTERNAL BILIARY DRAIN  Physician: Rachelle Hora. Henn, MD  Medications:Versed 2 mg IV  Fluoroscopy time: 5.5 minutes  Contrast:  25 ml Omnipaque-300  Procedure:Informed consent was obtained for a cholangiogram and internalization of biliary drain.  The patient was also consented for a possible brush biopsy.  The patient was placed supine on the interventional table.  The existing biliary drain was prepped and draped in a sterile fashion.  Maximal barrier sterile technique was utilized including caps, mask, sterile gowns, sterile gloves, sterile drape, hand hygiene and skin antiseptic.  Contrast was injected into the biliary drain.  The biliary  catheter was cut and removed over a Bentson wire.  5-French Kumpe catheter was used to perform additional cholangiograms.  Catheter was also advanced into the duodenum.  A Bentson wire was placed in the small bowel and a 10-French internal-external biliary drain was placed over a Bentson wire.  The distal aspect of the drain was reconstituted in the small bowel.  Catheter sutured to the skin and  attached to a gravity bag.  Findings:The cholangiogram demonstrated an external biliary drain. The patient's anatomy is very unusual in that the common bile duct extends cephalad and is left of midline.  Cholangiogram demonstrated a very large filling defect in the distal common bile duct consistent with a large stone.  There are additional filling defects throughout the biliary system suggestive for additional stones.  The ampulla is widely patent and contrast is now spontaneously draining into the small bowel.  An underlying stricture or lesion was not identified.  Complications: None  Impression:  Successful placement of an internal-external biliary drain.  The cause of the obstructive jaundice is due to biliary stones. There was a very large stone in the distal common bile duct which was mobile throughout the procedure.  Plan:  Plan internal-external biliary drainage until the patient has fully recovered from sepsis and respiratory distress.  Will need GI's opinion about whether ERCP and stone extraction are possible due to the patient's abnormal anatomy.  Otherwise, the patient may be a candidate for percutaneous sphincteroplasty and stone removal.   Original Report Authenticated By: Richarda Overlie, M.D.    Ir Biliary Drain Catheter Placement  03/31/2012  *RADIOLOGY REPORT*  Clinical history:71 year old with obstructive jaundice.  The patient currently has an external biliary drain.  PROCEDURE(S): CHOLANGIOGRAM THROUGH EXISTING TUBE; PLACEMENT OF INTERNAL EXTERNAL BILIARY DRAIN  Physician: Rachelle Hora. Henn, MD  Medications:Versed 2 mg IV  Fluoroscopy time: 5.5 minutes  Contrast:  25 ml Omnipaque-300  Procedure:Informed consent was obtained for a cholangiogram and internalization of biliary drain.  The patient was also consented for a possible brush biopsy.  The patient was placed supine on the interventional table.  The existing biliary drain was prepped and draped in a sterile fashion.  Maximal barrier sterile  technique was utilized including caps, mask, sterile gowns, sterile gloves, sterile drape, hand hygiene and skin antiseptic.  Contrast was injected into the biliary drain.  The biliary catheter was cut and removed over a Bentson wire.  5-French Kumpe catheter was used to perform additional cholangiograms.  Catheter was also advanced into the duodenum.  A Bentson wire was placed in the small bowel and a 10-French internal-external biliary drain was placed over a Bentson wire.  The distal aspect of the drain was reconstituted in the small bowel.  Catheter sutured to the skin and attached to a gravity bag.  Findings:The cholangiogram demonstrated an external biliary drain. The patient's anatomy is very unusual in that the common bile duct extends cephalad and is left of midline.  Cholangiogram demonstrated a very large filling defect in the distal common bile duct consistent with a large stone.  There are additional filling defects throughout the biliary system suggestive for additional stones.  The ampulla is widely patent and contrast is now spontaneously draining into the small bowel.  An underlying stricture or lesion was not identified.  Complications: None  Impression:  Successful placement of an internal-external biliary drain.  The cause of the obstructive jaundice is due to biliary stones. There was a very large stone in the distal common bile  duct which was mobile throughout the procedure.  Plan:  Plan internal-external biliary drainage until the patient has fully recovered from sepsis and respiratory distress.  Will need GI's opinion about whether ERCP and stone extraction are possible due to the patient's abnormal anatomy.  Otherwise, the patient may be a candidate for percutaneous sphincteroplasty and stone removal.   Original Report Authenticated By: Richarda Overlie, M.D.    Dg Chest Port 1 View  04/01/2012  *RADIOLOGY REPORT*  Clinical Data: Evaluate ET tube placement  PORTABLE CHEST - 1 VIEW  Comparison:  03/31/2012  Findings: The endotracheal tube tip is above the carina.  There is a right IJ catheter with tip in the projection of the SVC. Bilateral pleural effusions are unchanged. There is a mild interstitial edema.  Large hiatal hernia is again noted.  IMPRESSION:  1.  No change in position of ET tube. 2.  Persistent pleural effusions and mild interstitial edema.   Original Report Authenticated By: Rosealee Albee, M.D.    Dg Chest Port 1 View  03/31/2012  *RADIOLOGY REPORT*  Clinical Data: Sepsis  PORTABLE CHEST - 1 VIEW  Comparison: 03/29/2012  Findings: Endotracheal tube 2.2 cm above the carina.  Other support apparatus stable.  Bilateral pleural effusions persist with bibasilar airspace disease/consolidation.  Large hiatal hernia noted with left hemidiaphragm elevation as before.  Stable heart size and vascularity.  No pneumothorax.  No significant interval change.  IMPRESSION: Stable portable chest exam   Original Report Authenticated By: Judie Petit. Ruel Favors, M.D.      Principal Problem:  *Chest pain syndrome Active Problems:  CAD (coronary artery disease)  COPD (chronic obstructive pulmonary disease)  Anxiety  CHF (congestive heart failure)  Vitamin D deficiency disease  Suicidal ideation  UTI (lower urinary tract infection)  Hypotension  Fever  Septic shock  Acute respiratory failure with hypoxia   ASSESSMENT AND PLAN  PULMONARY  Lab 03/29/12 0339 03/28/12 0331 03/26/12 0412 03/26/12 0305 03/26/12 0040  PHART 7.414 7.365 7.359 -- 7.363  PCO2ART 32.4* 32.6* 35.5 -- 39.1  PO2ART 95.5 142.0* 68.0* -- 65.0*  HCO3 20.3 18.7* 20.0 -- 21.5  O2SAT 98.2 99.0 93.0 78.9 93.3   Ventilator Settings: Vent Mode:  [-] CPAP FiO2 (%):  [30 %-40.3 %] 40.1 % Set Rate:  [14 bmp] 14 bmp Vt Set:  [450 mL] 450 mL PEEP:  [5 cmH20] 5 cmH20 Pressure Support:  [10 cmH20] 10 cmH20 Plateau Pressure:  [16 cmH20-20 cmH20] 16 cmH20 CXR:  Bilateral effusions and pulm edema unchanged 8/17 ETT:  8/15  >>  A:  Acute on chronic hypoxemic respiratory failure due to pulmonary edema, effusions; COPD on 3 L O2 at home  - on 04/01/2012: Meets SBT criteria but failed SBT on 5/5.  CXR still with effusions and congested  P:  SBT 5/5 again; extubation depending in GI plan for ERCP; might be best to do ERCP on vent support Start diuresis (BNP 3K)   CARDIOVASCULAR  Lab 04/01/12 0415 03/28/12 0415 03/27/12 0400 03/26/12 0130 03/26/12 0029 03/25/12 2200 03/25/12 1742  TROPONINI -- -- -- <0.30 <0.30 -- <0.30  LATICACIDVEN -- 1.7 2.5* 2.5* -- 3.5* --  PROBNP 2793.0* -- -- -- -- -- --   ECG:  Sinus tach, peaked T waves, no ST wave changes Lines:  03/26/12 R IJ CVL >> 03/25/12 2D TTE LVEF 60-65%, elevated PA pressure  A: Septic shock and currently volume overloaded based on physical exam and CXR; question mixed shock (cardiogenic?) given exam  findings; CVP 8/17 am = 8-9. S/p EGDT CAD but ruled out for MI, 03/25/12 TTE normal Lactate 2.5 >> 2.5 on 8/16   - on 03/30/12 and 03/31/12 and 04/01/12: normal BP  P:  -monitor -see ID section - start diuresis 04/01/12    RENAL  Lab 04/01/12 0415 03/31/12 0419 03/30/12 0424 03/29/12 0345 03/28/12 0415  NA 148* 149* 141 131* 127*  K 3.2* 3.4* -- -- --  CL 110 116* 109 100 97  CO2 32 27 25 22 21   BUN 18 26* 36* 35* 27*  CREATININE 0.43* 0.44* 0.55 0.56 0.55  CALCIUM 8.3* 9.2 9.0 8.5 8.3*  MG 1.9 2.1 -- -- --  PHOS 2.9 1.2* -- -- --   Intake/Output      08/20 0701 - 08/21 0700 08/21 0701 - 08/22 0700   I.V. (mL/kg) 140 (2.7) 40 (0.8)   NG/GT 1050 100   IV Piggyback 953 25   Total Intake(mL/kg) 2143 (41.7) 165 (3.2)   Urine (mL/kg/hr) 2520 (2) 75   Drains 835    Stool     Total Output 3355 75   Net -1212 +90         Foley:  03/25/12  A:  Oliguria, and Hyponatremia all resolved 8/19  - on 04/01/2012: Hypokalemic and Low mag. Na improving  P:   -kvo d5 water -Mag and K  repletion - monitor bmp  GASTROINTESTINAL  Lab 03/31/12 0419  03/28/12 0415 03/27/12 0400 03/26/12 0029  AST 18 66* 76* 117*  ALT 49* 119* 139* 216*  ALKPHOS 140* 170* 156* 143*  BILITOT 2.0* 4.5* 4.5* 3.4*  PROT 4.7* 4.7* 4.6* 4.8*  ALBUMIN 2.1* 2.1* 2.0* 2.5*    A:  Elevated transaminases and alk phos, improving,due to cholangitis (hx chole), CBD obstruction Also with infrequent bowel sounds, history of multiple abdominal surgeries, ileus on CT scan and improved bowel sounds 8/17; note huge hiatal hernia   -8/16 external biliary drain - 817  Start tube feeds  - on 03/31/12:  IR procedure - gall stones blocking CBD. No evidence of malignancy. S/p internal external biliary drain. S/p CCS consult - recommending GI ERCP sphincterotomy  P:   - GI consult recalled for ERCP sphincterotomy and +/- stent placement (d/w Dr Evette Cristal); will hold off on extubation for this to happen next 24-48h HEMATOLOGIC  Lab 04/01/12 0415 03/31/12 0419 03/30/12 0424 03/29/12 0345 03/28/12 2100 03/28/12 0415 03/27/12 0400 03/26/12 1215  HGB 9.2* 10.4* 10.0* 8.9* -- 9.8* -- --  HCT 27.4* 30.4* 29.3* 26.2* -- 29.5* -- --  PLT 96* 68* 45* 23* 20* -- -- --  INR -- -- -- -- 1.07 1.12 1.22 1.47  APTT -- -- -- -- 30 -- -- 37   A:  Thrombocytopenia - likely sepsis related (not on heparin, not in DIC)       Anemia of critical illness   - on 03/2112: Plat improving  P:   - PRBC only for Hgb < 7gm% unless actively bleeding - monitor platelent and bleeding  INFECTIOUS  Lab 04/01/12 0415 03/31/12 0419 03/30/12 0424 03/29/12 0345 03/28/12 0415 03/27/12 0400 03/26/12 0130 03/25/12 2200  WBC 19.1* 22.3* 19.0* 14.3* 23.0* -- -- --  PROCALCITON -- -- -- -- -- 39.94 11.59 9.25   Cultures: 03/25/12 blood >> ecoli +  ENterococcus 03/25/12 urine >> COAG NEG STAPH  8/16 Biliary drain >> E colii and Enterococcus +  Results for orders placed during the hospital encounter of 03/24/12  URINE  CULTURE     Status: Normal   Collection Time   03/25/12  3:28 PM      Component Value Range  Status Comment   Specimen Description URINE, RANDOM   Final    Special Requests NONE   Final    Culture  Setup Time 03/25/2012 15:54   Final    Colony Count 65,000 COLONIES/ML   Final    Culture     Final    Value: STAPHYLOCOCCUS SPECIES (COAGULASE NEGATIVE)     Note: RIFAMPIN AND GENTAMICIN SHOULD NOT BE USED AS SINGLE DRUGS FOR TREATMENT OF STAPH INFECTIONS.   Report Status 03/28/2012 FINAL   Final    Organism ID, Bacteria STAPHYLOCOCCUS SPECIES (COAGULASE NEGATIVE)   Final   CULTURE, BLOOD (ROUTINE X 2)     Status: Normal   Collection Time   03/25/12  3:55 PM      Component Value Range Status Comment   Specimen Description BLOOD LEFT HAND   Final    Special Requests BOTTLES DRAWN AEROBIC ONLY 5CC   Final    Culture  Setup Time 03/25/2012 20:33   Final    Culture     Final    Value: ESCHERICHIA COLI     ENTEROCOCCUS GALLINARUM     Note: Gram Stain Report Called to,Read Back By and Verified With: CARRIE H @1021  03/26/12 BY KRAWS   Report Status 03/31/2012 FINAL   Final    Organism ID, Bacteria ESCHERICHIA COLI   Final    Organism ID, Bacteria ENTEROCOCCUS GALLINARUM   Final   CULTURE, BLOOD (ROUTINE X 2)     Status: Normal (Preliminary result)   Collection Time   03/25/12  4:00 PM      Component Value Range Status Comment   Specimen Description BLOOD LEFT ARM   Final    Special Requests BOTTLES DRAWN AEROBIC ONLY 5CC   Final    Culture  Setup Time 03/25/2012 20:33   Final    Culture     Final    Value: ESCHERICHIA COLI     Note: SUSCEPTIBILITIES PERFORMED ON PREVIOUS CULTURE WITHIN THE LAST 5 DAYS.     GRAM POSITIVE COCCI IN CHAINS     Note: Gram Stain Report Called to,Read Back By and Verified With: CARRIE H @1021  03/26/12 BY KRAWS   Report Status PENDING   Incomplete   MRSA PCR SCREENING     Status: Normal   Collection Time   03/25/12  9:10 PM      Component Value Range Status Comment   MRSA by PCR NEGATIVE  NEGATIVE Final   ANAEROBIC CULTURE     Status: Normal   Collection  Time   03/27/12 12:55 PM      Component Value Range Status Comment   Specimen Description FLUID BILE   Final    Special Requests NONE   Final    Gram Stain     Final    Value: NO WBC SEEN     NO ORGANISMS SEEN   Culture NO ANAEROBES ISOLATED   Final    Report Status 04/01/2012 FINAL   Final   BODY FLUID CULTURE     Status: Normal   Collection Time   03/27/12 12:55 PM      Component Value Range Status Comment   Specimen Description FLUID BILE   Final    Special Requests NONE   Final    Gram Stain     Final    Value: NO  WBC SEEN     NO ORGANISMS SEEN   Culture     Final    Value: ABUNDANT ESCHERICHIA COLI     MODERATE ENTEROCOCCUS GALLINARUM     Note: CRITICAL RESULT CALLED TO, READ BACK BY AND VERIFIED WITH: CARRIE H @1259  03/31/12 BY KRAWS   Report Status 03/31/2012 FINAL   Final    Organism ID, Bacteria ESCHERICHIA COLI   Final    Organism ID, Bacteria ENTEROCOCCUS GALLINARUM   Final   VRE CULTURE     Status: Normal (Preliminary result)   Collection Time   03/29/12  5:16 AM      Component Value Range Status Comment   Specimen Description STOOL   Final    Special Requests NONE   Final    Culture Culture reincubated for better growth   Final    Report Status PENDING   Incomplete      Antibiotics: 8/14 zosyn (UTI, Cholangitis)>>8/17 8/14 vanc (GPC's in blood) >> 820 8/17 ceftaz (ecoli bacteremia) >> 8/17 flagyl (cholangitis, bacteremia)>>  A:  Septic shock, ddx UTI, cholangitis with classic gall stone organism. S/p egdt  P:   -cont abx as above ---monitor biliary drain output    ENDOCRINE  Lab 04/01/12 0754 04/01/12 0400 04/01/12 0008 03/31/12 2034 03/31/12 1550  GLUCAP 141* 126* 119* 106* 162*   A:  Hyperglycemia  P:   -ICU hyperglycemia protocol  NEUROLOGIC  A:  Suicidality on 8/14  - on 03/30/12 abnd 04/01/12: Normal WUA  P:    - change to prn sedation starting 03/30/12; avoid benzo -psych to follow up after critical illness  BEST PRACTICE /  DISPOSITION Level of Care:  ICU Primary Service:  PCCM Consultants:  psych Code Status:  full Diet:  npo DVT Px:  lovenox off GI Px:  ppi Skin Integrity:  normal Social / Family:  Updated son and daughter at bedside 8/15. None at bedside 8/19 and 03/31/12. Son DPOA updated over phone 04/01/12    The patient is critically ill with multiple organ systems failure and requires high complexity decision making for assessment and support, frequent evaluation and titration of therapies, application of advanced monitoring technologies and extensive interpretation of multiple databases.   Critical Care Time devoted to patient care services described in this note is  35  Minutes.  Dr. Kalman Shan, M.D., [facility].C.P Pulmonary and Critical Care Medicine Staff Physician  System [place] [facility] Pager: [phone], If no answer or between  15:00h - 7:00h: call [phone]  04/01/2012 9:49 AM

## 2012-04-01 NOTE — Progress Notes (Signed)
Still some abdominal tenderness but HD better. Drain in. GI to evaluate for consideration of ERCP.  I D/W Dr. Colletta [place]. Patient examined and I agree with the assessment and plan  Violeta Gelinas, MD, MPH, FACS Pager: [phone]  04/01/2012 2:23 PM

## 2012-04-01 NOTE — Progress Notes (Signed)
Patient ID: Laurie Sparks, female   DOB: 02/20/1941, 71 y.o.   MRN: [id number]    Subjective: Pt intubated but awake and alert, c/o abd pain across upper quad  Objective: Vital signs in last 24 hours: Temp:  [98 F (36.7 C)-99.1 F (37.3 C)] 99.1 F (37.3 C) (08/21 0755) Pulse Rate:  [77-99] 87  (08/21 0746) Resp:  [14-24] 16  (08/21 0746) BP: (105-200)/(55-94) 123/62 mmHg (08/21 0746) SpO2:  [97 %-100 %] 100 % (08/21 0755) FiO2 (%):  [30 %-40.3 %] 40 % (08/21 0755) Weight:  [113 lb 5.1 oz (51.4 kg)] 113 lb 5.1 oz (51.4 kg) (08/21 0600) Last BM Date: 03/30/12  Intake/Output from previous day: 08/20 0701 - 08/21 0700 In: 2143 [I.V.:140; NG/GT:1050; IV Piggyback:953] Out: 3355 [Urine:2520; Drains:835] Intake/Output this shift:    PE:  General: intubated but awake and alert Lungs: CTA bil Heart: RRR Abd: soft, tender in upper quads, ecchymosis right flank, +bs, perc drain with bilious output  Lab Results:   Basename 04/01/12 0415 03/31/12 0419  WBC 19.1* 22.3*  HGB 9.2* 10.4*  HCT 27.4* 30.4*  PLT 96* 68*   BMET  Basename 04/01/12 0415 03/31/12 0419  NA 148* 149*  K 3.2* 3.4*  CL 110 116*  CO2 32 27  GLUCOSE 130* 125*  BUN 18 26*  CREATININE 0.43* 0.44*  CALCIUM 8.3* 9.2   PT/INR No results found for this basename: LABPROT:2,INR:2 in the last 72 hours CMP     Component Value Date/Time   NA 148* 04/01/2012 0415   K 3.2* 04/01/2012 0415   CL 110 04/01/2012 0415   CO2 32 04/01/2012 0415   GLUCOSE 130* 04/01/2012 0415   BUN 18 04/01/2012 0415   CREATININE 0.43* 04/01/2012 0415   CALCIUM 8.3* 04/01/2012 0415   PROT 4.7* 03/31/2012 0419   ALBUMIN 2.1* 03/31/2012 0419   AST 18 03/31/2012 0419   ALT 49* 03/31/2012 0419   ALKPHOS 140* 03/31/2012 0419   BILITOT 2.0* 03/31/2012 0419   GFRNONAA >90 04/01/2012 0415   GFRAA >90 04/01/2012 0415   Lipase     Component Value Date/Time   LIPASE 139* 04/01/2012 0415       Studies/Results: Ir Cholan Exist Tube  03/31/2012   *RADIOLOGY REPORT*  Clinical history:71 year old with obstructive jaundice.  The patient currently has an external biliary drain.  PROCEDURE(S): CHOLANGIOGRAM THROUGH EXISTING TUBE; PLACEMENT OF INTERNAL EXTERNAL BILIARY DRAIN  Physician: Rachelle Hora. Henn, MD  Medications:Versed 2 mg IV  Fluoroscopy time: 5.5 minutes  Contrast:  25 ml Omnipaque-300  Procedure:Informed consent was obtained for a cholangiogram and internalization of biliary drain.  The patient was also consented for a possible brush biopsy.  The patient was placed supine on the interventional table.  The existing biliary drain was prepped and draped in a sterile fashion.  Maximal barrier sterile technique was utilized including caps, mask, sterile gowns, sterile gloves, sterile drape, hand hygiene and skin antiseptic.  Contrast was injected into the biliary drain.  The biliary catheter was cut and removed over a Bentson wire.  5-French Kumpe catheter was used to perform additional cholangiograms.  Catheter was also advanced into the duodenum.  A Bentson wire was placed in the small bowel and a 10-French internal-external biliary drain was placed over a Bentson wire.  The distal aspect of the drain was reconstituted in the small bowel.  Catheter sutured to the skin and attached to a gravity bag.  Findings:The cholangiogram demonstrated an external biliary drain. The patient's anatomy  is very unusual in that the common bile duct extends cephalad and is left of midline.  Cholangiogram demonstrated a very large filling defect in the distal common bile duct consistent with a large stone.  There are additional filling defects throughout the biliary system suggestive for additional stones.  The ampulla is widely patent and contrast is now spontaneously draining into the small bowel.  An underlying stricture or lesion was not identified.  Complications: None  Impression:  Successful placement of an internal-external biliary drain.  The cause of the obstructive  jaundice is due to biliary stones. There was a very large stone in the distal common bile duct which was mobile throughout the procedure.  Plan:  Plan internal-external biliary drainage until the patient has fully recovered from sepsis and respiratory distress.  Will need GI's opinion about whether ERCP and stone extraction are possible due to the patient's abnormal anatomy.  Otherwise, the patient may be a candidate for percutaneous sphincteroplasty and stone removal.   Original Report Authenticated By: Richarda Overlie, M.D.    Ir Biliary Drain Catheter Placement  03/31/2012  *RADIOLOGY REPORT*  Clinical history:71 year old with obstructive jaundice.  The patient currently has an external biliary drain.  PROCEDURE(S): CHOLANGIOGRAM THROUGH EXISTING TUBE; PLACEMENT OF INTERNAL EXTERNAL BILIARY DRAIN  Physician: Rachelle Hora. Henn, MD  Medications:Versed 2 mg IV  Fluoroscopy time: 5.5 minutes  Contrast:  25 ml Omnipaque-300  Procedure:Informed consent was obtained for a cholangiogram and internalization of biliary drain.  The patient was also consented for a possible brush biopsy.  The patient was placed supine on the interventional table.  The existing biliary drain was prepped and draped in a sterile fashion.  Maximal barrier sterile technique was utilized including caps, mask, sterile gowns, sterile gloves, sterile drape, hand hygiene and skin antiseptic.  Contrast was injected into the biliary drain.  The biliary catheter was cut and removed over a Bentson wire.  5-French Kumpe catheter was used to perform additional cholangiograms.  Catheter was also advanced into the duodenum.  A Bentson wire was placed in the small bowel and a 10-French internal-external biliary drain was placed over a Bentson wire.  The distal aspect of the drain was reconstituted in the small bowel.  Catheter sutured to the skin and attached to a gravity bag.  Findings:The cholangiogram demonstrated an external biliary drain. The patient's anatomy is  very unusual in that the common bile duct extends cephalad and is left of midline.  Cholangiogram demonstrated a very large filling defect in the distal common bile duct consistent with a large stone.  There are additional filling defects throughout the biliary system suggestive for additional stones.  The ampulla is widely patent and contrast is now spontaneously draining into the small bowel.  An underlying stricture or lesion was not identified.  Complications: None  Impression:  Successful placement of an internal-external biliary drain.  The cause of the obstructive jaundice is due to biliary stones. There was a very large stone in the distal common bile duct which was mobile throughout the procedure.  Plan:  Plan internal-external biliary drainage until the patient has fully recovered from sepsis and respiratory distress.  Will need GI's opinion about whether ERCP and stone extraction are possible due to the patient's abnormal anatomy.  Otherwise, the patient may be a candidate for percutaneous sphincteroplasty and stone removal.   Original Report Authenticated By: Richarda Overlie, M.D.    Dg Chest Port 1 View  04/01/2012  *RADIOLOGY REPORT*  Clinical Data: Evaluate ET  tube placement  PORTABLE CHEST - 1 VIEW  Comparison: 03/31/2012  Findings: The endotracheal tube tip is above the carina.  There is a right IJ catheter with tip in the projection of the SVC. Bilateral pleural effusions are unchanged. There is a mild interstitial edema.  Large hiatal hernia is again noted.  IMPRESSION:  1.  No change in position of ET tube. 2.  Persistent pleural effusions and mild interstitial edema.   Original Report Authenticated By: Rosealee Albee, M.D.    Dg Chest Port 1 View  03/31/2012  *RADIOLOGY REPORT*  Clinical Data: Sepsis  PORTABLE CHEST - 1 VIEW  Comparison: 03/29/2012  Findings: Endotracheal tube 2.2 cm above the carina.  Other support apparatus stable.  Bilateral pleural effusions persist with bibasilar airspace  disease/consolidation.  Large hiatal hernia noted with left hemidiaphragm elevation as before.  Stable heart size and vascularity.  No pneumothorax.  No significant interval change.  IMPRESSION: Stable portable chest exam   Original Report Authenticated By: Judie Petit. Ruel Favors, M.D.     Anti-infectives: Anti-infectives     Start     Dose/Rate Route Frequency Ordered Stop   04/01/12 0100   vancomycin (VANCOCIN) 1,250 mg in sodium chloride 0.9 % 250 mL IVPB  Status:  Discontinued        1,250 mg 166.7 mL/hr over 90 Minutes Intravenous Every 24 hours 03/31/12 0006 03/31/12 1604   03/31/12 1630  piperacillin-tazobactam (ZOSYN) IVPB 3.375 g       3.375 g 12.5 mL/hr over 240 Minutes Intravenous Every 8 hours 03/31/12 1600     03/31/12 0100   vancomycin (VANCOCIN) 250 mg in sodium chloride 0.9 % 100 mL IVPB        250 mg 100 mL/hr over 60 Minutes Intravenous  Once 03/31/12 0028 03/31/12 0227   03/28/12 2200   cefTAZidime (FORTAZ) injection 2 g  Status:  Discontinued        2 g Intramuscular Every 12 hours 03/28/12 1613 03/28/12 1619   03/28/12 1700   metroNIDAZOLE (FLAGYL) IVPB 500 mg  Status:  Discontinued        500 mg 100 mL/hr over 60 Minutes Intravenous Every 8 hours 03/28/12 1613 03/31/12 1604   03/28/12 1700   cefTAZidime (FORTAZ) 2 g in dextrose 5 % 50 mL IVPB  Status:  Discontinued        2 g 100 mL/hr over 30 Minutes Intravenous Every 12 hours 03/28/12 1619 03/31/12 1604   03/27/12 2300   vancomycin (VANCOCIN) IVPB 1000 mg/200 mL premix  Status:  Discontinued        1,000 mg 200 mL/hr over 60 Minutes Intravenous Every 24 hours 03/27/12 1135 03/31/12 0004   03/26/12 0600   piperacillin-tazobactam (ZOSYN) IVPB 3.375 g  Status:  Discontinued        3.375 g 12.5 mL/hr over 240 Minutes Intravenous 3 times per day 03/26/12 0116 03/28/12 1618   03/26/12 0130  piperacillin-tazobactam (ZOSYN) IVPB 3.375 g       3.375 g 100 mL/hr over 30 Minutes Intravenous  Once 03/26/12 0109 03/26/12  0402   03/26/12 0115   piperacillin-tazobactam (ZOSYN) IVPB 3.375 g  Status:  Discontinued        3.375 g 12.5 mL/hr over 240 Minutes Intravenous 3 times per day 03/26/12 0115 03/26/12 0116   03/25/12 2300   vancomycin (VANCOCIN) 750 mg in sodium chloride 0.9 % 150 mL IVPB  Status:  Discontinued        750 mg 150  mL/hr over 60 Minutes Intravenous Every 24 hours 03/25/12 2240 03/27/12 1135   03/25/12 2300   ceFEPIme (MAXIPIME) 1 g in dextrose 5 % 50 mL IVPB  Status:  Discontinued        1 g 100 mL/hr over 30 Minutes Intravenous Every 24 hours 03/25/12 2240 03/26/12 0108   03/25/12 1630   cefTRIAXone (ROCEPHIN) 1 g in dextrose 5 % 50 mL IVPB  Status:  Discontinued        1 g 100 mL/hr over 30 Minutes Intravenous Every 24 hours 03/25/12 1619 03/25/12 2225           Assessment/Plan  1. Cholangitis with obstructive jaundice due to biliary stones in the distal common bile duct: per Dr. Janee Morn, Continue drain to allow sepsis to resolve. Once well, GI or IR approach to clear large CBD stones.  Will continue to follow.     LOS: 8 days    Kasie Leccese 04/01/2012

## 2012-04-01 NOTE — Progress Notes (Signed)
Asked by Dr. Lowella Dandy to reevaluate biliary obstruction and feasibility of endoscopic approach in light of latest imaging studies which appeared to show a very large stone rather than what was initially felt to be a soft tissue mass in the common bile duct. I feel that after reviewing the films, given the grossly distorted upper GI tract anatomy with the majority of the stomach in the chest displacing the common bile duct to the left as well as the size of the stone , I think the chances of relief of her obstruction endoscopically on meal. It is unlikely that the papilla could even be reached or found and if it could it is very unlikely that such a large stone could be addressed endoscopically. I would favor any further manipulations through the existing drain such as ampullary sphincteroplasty, attempt at basket crushing of the stone percutaneously if that is feasible or any other input from surgery initially. I will discuss with my partners their opinion on the endoscopic approaches and could conceivably discuss with a tertiary care center. I do not think [facility] is going to play any role in shrinking this very large stone. We'll continue to follow along and see if help in any way.

## 2012-04-02 ENCOUNTER — Inpatient Hospital Stay (HOSPITAL_COMMUNITY): Payer: Medicare Other

## 2012-04-02 DIAGNOSIS — K8309 Other cholangitis: Secondary | ICD-10-CM | POA: Diagnosis present

## 2012-04-02 LAB — GLUCOSE, CAPILLARY
Glucose-Capillary: 138 mg/dL — ABNORMAL HIGH (ref 70–99)
Glucose-Capillary: 125 mg/dL — ABNORMAL HIGH (ref 70–99)
Glucose-Capillary: 128 mg/dL — ABNORMAL HIGH (ref 70–99)
Glucose-Capillary: 113 mg/dL — ABNORMAL HIGH (ref 70–99)

## 2012-04-02 LAB — BASIC METABOLIC PANEL
BUN: 15 mg/dL (ref 6–23)
CO2: 37 mEq/L — ABNORMAL HIGH (ref 19–32)
Calcium: 8.5 mg/dL (ref 8.4–10.5)
Creatinine, Ser: 0.5 mg/dL (ref 0.50–1.10)
Glucose, Bld: 110 mg/dL — ABNORMAL HIGH (ref 70–99)

## 2012-04-02 LAB — CBC
MCH: 31.7 pg (ref 26.0–34.0)
MCV: 95.9 fL (ref 78.0–100.0)
Platelets: 144 10*3/uL — ABNORMAL LOW (ref 150–400)
RDW: 14.5 % (ref 11.5–15.5)

## 2012-04-02 LAB — HEPATIC FUNCTION PANEL
Alkaline Phosphatase: 98 U/L (ref 39–117)
Indirect Bilirubin: 0.6 mg/dL (ref 0.3–0.9)
Total Protein: 4.7 g/dL — ABNORMAL LOW (ref 6.0–8.3)

## 2012-04-02 MED ORDER — VITAL AF 1.2 CAL PO LIQD
1000.0000 mL | ORAL | Status: DC
  Administered 2012-04-02: 1000 mL
  Filled 2012-04-02 (×3): qty 1000

## 2012-04-02 NOTE — Progress Notes (Addendum)
Subjective: Pt ok. Not quite ready for extubation. Reviewed GI and Surgery notes  Objective: Physical Exam: BP 116/74  Pulse 81  Temp 99.2 F (37.3 C) (Oral)  Resp 11  Ht 5\' 1"  (1.549 m)  Wt 108 lb 3.9 oz (49.1 kg)  BMI 20.45 kg/m2  SpO2 97% Drain intact, good bilious output, some sludge/sediment   Labs: CBC  Basename 04/02/12 0414 04/01/12 0415  WBC 16.0* 19.1*  HGB 8.6* 9.2*  HCT 26.0* 27.4*  PLT 144* 96*   BMET  Basename 04/02/12 0414 04/01/12 0415  NA 145 148*  K 3.9 3.2*  CL 105 110  CO2 37* 32  GLUCOSE 110* 130*  BUN 15 18  CREATININE 0.50 0.43*  CALCIUM 8.5 8.3*   LFT  Basename 04/01/12 0415 03/31/12 0419  PROT -- 4.7*  ALBUMIN -- 2.1*  AST -- 18  ALT -- 49*  ALKPHOS -- 140*  BILITOT -- 2.0*  BILIDIR -- --  IBILI -- --  LIPASE 139* --   PT/INR No results found for this basename: LABPROT:2,INR:2 in the last 72 hours   Studies/Results: Ir Cholan Exist Tube  03/31/2012  *RADIOLOGY REPORT*  Clinical history:71 year old with obstructive jaundice.  The patient currently has an external biliary drain.  PROCEDURE(S): CHOLANGIOGRAM THROUGH EXISTING TUBE; PLACEMENT OF INTERNAL EXTERNAL BILIARY DRAIN  Physician: Rachelle Hora. Henn, MD  Medications:Versed 2 mg IV  Fluoroscopy time: 5.5 minutes  Contrast:  25 ml Omnipaque-300  Procedure:Informed consent was obtained for a cholangiogram and internalization of biliary drain.  The patient was also consented for a possible brush biopsy.  The patient was placed supine on the interventional table.  The existing biliary drain was prepped and draped in a sterile fashion.  Maximal barrier sterile technique was utilized including caps, mask, sterile gowns, sterile gloves, sterile drape, hand hygiene and skin antiseptic.  Contrast was injected into the biliary drain.  The biliary catheter was cut and removed over a Bentson wire.  5-French Kumpe catheter was used to perform additional cholangiograms.  Catheter was also advanced into  the duodenum.  A Bentson wire was placed in the small bowel and a 10-French internal-external biliary drain was placed over a Bentson wire.  The distal aspect of the drain was reconstituted in the small bowel.  Catheter sutured to the skin and attached to a gravity bag.  Findings:The cholangiogram demonstrated an external biliary drain. The patient's anatomy is very unusual in that the common bile duct extends cephalad and is left of midline.  Cholangiogram demonstrated a very large filling defect in the distal common bile duct consistent with a large stone.  There are additional filling defects throughout the biliary system suggestive for additional stones.  The ampulla is widely patent and contrast is now spontaneously draining into the small bowel.  An underlying stricture or lesion was not identified.  Complications: None  Impression:  Successful placement of an internal-external biliary drain.  The cause of the obstructive jaundice is due to biliary stones. There was a very large stone in the distal common bile duct which was mobile throughout the procedure.  Plan:  Plan internal-external biliary drainage until the patient has fully recovered from sepsis and respiratory distress.  Will need GI's opinion about whether ERCP and stone extraction are possible due to the patient's abnormal anatomy.  Otherwise, the patient may be a candidate for percutaneous sphincteroplasty and stone removal.   Original Report Authenticated By: Richarda Overlie, M.D.    Ir Biliary Drain Catheter Placement  03/31/2012  *  RADIOLOGY REPORT*  Clinical history:71 year old with obstructive jaundice.  The patient currently has an external biliary drain.  PROCEDURE(S): CHOLANGIOGRAM THROUGH EXISTING TUBE; PLACEMENT OF INTERNAL EXTERNAL BILIARY DRAIN  Physician: Rachelle Hora. Henn, MD  Medications:Versed 2 mg IV  Fluoroscopy time: 5.5 minutes  Contrast:  25 ml Omnipaque-300  Procedure:Informed consent was obtained for a cholangiogram and  internalization of biliary drain.  The patient was also consented for a possible brush biopsy.  The patient was placed supine on the interventional table.  The existing biliary drain was prepped and draped in a sterile fashion.  Maximal barrier sterile technique was utilized including caps, mask, sterile gowns, sterile gloves, sterile drape, hand hygiene and skin antiseptic.  Contrast was injected into the biliary drain.  The biliary catheter was cut and removed over a Bentson wire.  5-French Kumpe catheter was used to perform additional cholangiograms.  Catheter was also advanced into the duodenum.  A Bentson wire was placed in the small bowel and a 10-French internal-external biliary drain was placed over a Bentson wire.  The distal aspect of the drain was reconstituted in the small bowel.  Catheter sutured to the skin and attached to a gravity bag.  Findings:The cholangiogram demonstrated an external biliary drain. The patient's anatomy is very unusual in that the common bile duct extends cephalad and is left of midline.  Cholangiogram demonstrated a very large filling defect in the distal common bile duct consistent with a large stone.  There are additional filling defects throughout the biliary system suggestive for additional stones.  The ampulla is widely patent and contrast is now spontaneously draining into the small bowel.  An underlying stricture or lesion was not identified.  Complications: None  Impression:  Successful placement of an internal-external biliary drain.  The cause of the obstructive jaundice is due to biliary stones. There was a very large stone in the distal common bile duct which was mobile throughout the procedure.  Plan:  Plan internal-external biliary drainage until the patient has fully recovered from sepsis and respiratory distress.  Will need GI's opinion about whether ERCP and stone extraction are possible due to the patient's abnormal anatomy.  Otherwise, the patient may be a  candidate for percutaneous sphincteroplasty and stone removal.   Original Report Authenticated By: Richarda Overlie, M.D.    Dg Chest Port 1 View  04/02/2012  *RADIOLOGY REPORT*  Clinical Data: Check ET tube.  PORTABLE CHEST - 1 VIEW  Comparison: 04/01/2012  Findings: Support devices are in stable position.  Endotracheal tube tip is at the carina and could be retracted slightly. There is hyperinflation of the lungs compatible with COPD.  Bilateral lower lobe airspace opacities and layering effusions.  Cardiomegaly.  No real change since prior study.  IMPRESSION: No interval change.  Endotracheal tube is near the carina and could be retracted slightly for optimal positioning.   Original Report Authenticated By: Cyndie Chime, M.D.    Dg Chest Port 1 View  04/01/2012  *RADIOLOGY REPORT*  Clinical Data: Evaluate ET tube placement  PORTABLE CHEST - 1 VIEW  Comparison: 03/31/2012  Findings: The endotracheal tube tip is above the carina.  There is a right IJ catheter with tip in the projection of the SVC. Bilateral pleural effusions are unchanged. There is a mild interstitial edema.  Large hiatal hernia is again noted.  IMPRESSION:  1.  No change in position of ET tube. 2.  Persistent pleural effusions and mild interstitial edema.   Original Report Authenticated By: Ladona Ridgel  H. Bradly Chris, M.D.     Assessment/Plan: Biliary stones; obstruction;  Cholangitis improving Internal/External bili drain in now, Bili down, WBC down GI recs/input noted Will d/w IR team regarding perc approach and timing for attempt at balloon sphincteroplasty and stone removal. Long discussion with pt and son regarding this plan.  Brayton El PA-C 04/02/2012 9:11 AM  Reviewed with IR team. Recommend cont int/ext drain. Allow pt to improve and stabilize clinically and plan for elective attempt at balloon sphincteroplasty and stone removal in about 2 weeks. This was discussed  with Dr. Lowella Dandy and Dr. Miles Costain.  11:54 AM

## 2012-04-02 NOTE — Progress Notes (Signed)
Patient ID: Laurie Sparks, female   DOB: 04-Jul-1941, 71 y.o.   MRN: [id number] Patient ID: Laurie Sparks, female   DOB: Dec 24, 1940, 71 y.o.   MRN: [id number]    Subjective: Pt intubated but awake and alert, c/o abd pain across upper quad remains.  Objective: Vital signs in last 24 hours: Temp:  [98.4 F (36.9 C)-99.2 F (37.3 C)] 99.2 F (37.3 C) (08/22 0728) Pulse Rate:  [73-99] 81  (08/22 0733) Resp:  [11-23] 11  (08/22 0733) BP: (82-157)/(38-87) 116/74 mmHg (08/22 0700) SpO2:  [97 %-100 %] 97 % (08/22 0733) FiO2 (%):  [39.7 %-40.2 %] 40 % (08/22 0822) Weight:  [108 lb 3.9 oz (49.1 kg)] 108 lb 3.9 oz (49.1 kg) (08/22 0500) Last BM Date: 04/01/12  Intake/Output from previous day: 08/21 0701 - 08/22 0700 In: 1858.1 [I.V.:717.1; NG/GT:990; IV Piggyback:151] Out: 4517 [Urine:3515; Drains:1000; Stool:2] Intake/Output this shift: Total I/O In: 67.5 [I.V.:20; NG/GT:35; IV Piggyback:12.5] Out: -   PE:  General: intubated but awake and alert Lungs: CTA bil Heart: RRR, no M/R/G Abd: soft, tender in upper quadrants, ecchymosis right flank, +bs, perc drain with bilious output (1060ml/24hr) Running low grade temp of 99.2  WBC trending downward currently at 16.0 hgb 8.6 today  Lab Results:   Basename 04/02/12 0414 04/01/12 0415  WBC 16.0* 19.1*  HGB 8.6* 9.2*  HCT 26.0* 27.4*  PLT 144* 96*   BMET  Basename 04/02/12 0414 04/01/12 0415  NA 145 148*  K 3.9 3.2*  CL 105 110  CO2 37* 32  GLUCOSE 110* 130*  BUN 15 18  CREATININE 0.50 0.43*  CALCIUM 8.5 8.3*   PT/INR No results found for this basename: LABPROT:2,INR:2 in the last 72 hours CMP     Component Value Date/Time   NA 145 04/02/2012 0414   K 3.9 04/02/2012 0414   CL 105 04/02/2012 0414   CO2 37* 04/02/2012 0414   GLUCOSE 110* 04/02/2012 0414   BUN 15 04/02/2012 0414   CREATININE 0.50 04/02/2012 0414   CALCIUM 8.5 04/02/2012 0414   PROT 4.7* 03/31/2012 0419   ALBUMIN 2.1* 03/31/2012 0419   AST 18 03/31/2012 0419   ALT 49*  03/31/2012 0419   ALKPHOS 140* 03/31/2012 0419   BILITOT 2.0* 03/31/2012 0419   GFRNONAA >90 04/02/2012 0414   GFRAA >90 04/02/2012 0414   Lipase     Component Value Date/Time   LIPASE 139* 04/01/2012 0415       Studies/Results: Ir Cholan Exist Tube  03/31/2012  *RADIOLOGY REPORT*  Clinical history:71 year old with obstructive jaundice.  The patient currently has an external biliary drain.  PROCEDURE(S): CHOLANGIOGRAM THROUGH EXISTING TUBE; PLACEMENT OF INTERNAL EXTERNAL BILIARY DRAIN  Physician: Rachelle Hora. Henn, MD  Medications:Versed 2 mg IV  Fluoroscopy time: 5.5 minutes  Contrast:  25 ml Omnipaque-300  Procedure:Informed consent was obtained for a cholangiogram and internalization of biliary drain.  The patient was also consented for a possible brush biopsy.  The patient was placed supine on the interventional table.  The existing biliary drain was prepped and draped in a sterile fashion.  Maximal barrier sterile technique was utilized including caps, mask, sterile gowns, sterile gloves, sterile drape, hand hygiene and skin antiseptic.  Contrast was injected into the biliary drain.  The biliary catheter was cut and removed over a Bentson wire.  5-French Kumpe catheter was used to perform additional cholangiograms.  Catheter was also advanced into the duodenum.  A Bentson wire was placed in the small bowel and a 10-French  internal-external biliary drain was placed over a Bentson wire.  The distal aspect of the drain was reconstituted in the small bowel.  Catheter sutured to the skin and attached to a gravity bag.  Findings:The cholangiogram demonstrated an external biliary drain. The patient's anatomy is very unusual in that the common bile duct extends cephalad and is left of midline.  Cholangiogram demonstrated a very large filling defect in the distal common bile duct consistent with a large stone.  There are additional filling defects throughout the biliary system suggestive for additional stones.   The ampulla is widely patent and contrast is now spontaneously draining into the small bowel.  An underlying stricture or lesion was not identified.  Complications: None  Impression:  Successful placement of an internal-external biliary drain.  The cause of the obstructive jaundice is due to biliary stones. There was a very large stone in the distal common bile duct which was mobile throughout the procedure.  Plan:  Plan internal-external biliary drainage until the patient has fully recovered from sepsis and respiratory distress.  Will need GI's opinion about whether ERCP and stone extraction are possible due to the patient's abnormal anatomy.  Otherwise, the patient may be a candidate for percutaneous sphincteroplasty and stone removal.   Original Report Authenticated By: Richarda Overlie, M.D.    Ir Biliary Drain Catheter Placement  03/31/2012  *RADIOLOGY REPORT*  Clinical history:71 year old with obstructive jaundice.  The patient currently has an external biliary drain.  PROCEDURE(S): CHOLANGIOGRAM THROUGH EXISTING TUBE; PLACEMENT OF INTERNAL EXTERNAL BILIARY DRAIN  Physician: Rachelle Hora. Henn, MD  Medications:Versed 2 mg IV  Fluoroscopy time: 5.5 minutes  Contrast:  25 ml Omnipaque-300  Procedure:Informed consent was obtained for a cholangiogram and internalization of biliary drain.  The patient was also consented for a possible brush biopsy.  The patient was placed supine on the interventional table.  The existing biliary drain was prepped and draped in a sterile fashion.  Maximal barrier sterile technique was utilized including caps, mask, sterile gowns, sterile gloves, sterile drape, hand hygiene and skin antiseptic.  Contrast was injected into the biliary drain.  The biliary catheter was cut and removed over a Bentson wire.  5-French Kumpe catheter was used to perform additional cholangiograms.  Catheter was also advanced into the duodenum.  A Bentson wire was placed in the small bowel and a 10-French  internal-external biliary drain was placed over a Bentson wire.  The distal aspect of the drain was reconstituted in the small bowel.  Catheter sutured to the skin and attached to a gravity bag.  Findings:The cholangiogram demonstrated an external biliary drain. The patient's anatomy is very unusual in that the common bile duct extends cephalad and is left of midline.  Cholangiogram demonstrated a very large filling defect in the distal common bile duct consistent with a large stone.  There are additional filling defects throughout the biliary system suggestive for additional stones.  The ampulla is widely patent and contrast is now spontaneously draining into the small bowel.  An underlying stricture or lesion was not identified.  Complications: None  Impression:  Successful placement of an internal-external biliary drain.  The cause of the obstructive jaundice is due to biliary stones. There was a very large stone in the distal common bile duct which was mobile throughout the procedure.  Plan:  Plan internal-external biliary drainage until the patient has fully recovered from sepsis and respiratory distress.  Will need GI's opinion about whether ERCP and stone extraction are possible due to  the patient's abnormal anatomy.  Otherwise, the patient may be a candidate for percutaneous sphincteroplasty and stone removal.   Original Report Authenticated By: Richarda Overlie, M.D.    Dg Chest Port 1 View  04/02/2012  *RADIOLOGY REPORT*  Clinical Data: Check ET tube.  PORTABLE CHEST - 1 VIEW  Comparison: 04/01/2012  Findings: Support devices are in stable position.  Endotracheal tube tip is at the carina and could be retracted slightly. There is hyperinflation of the lungs compatible with COPD.  Bilateral lower lobe airspace opacities and layering effusions.  Cardiomegaly.  No real change since prior study.  IMPRESSION: No interval change.  Endotracheal tube is near the carina and could be retracted slightly for optimal  positioning.   Original Report Authenticated By: Cyndie Chime, M.D.    Dg Chest Port 1 View  04/01/2012  *RADIOLOGY REPORT*  Clinical Data: Evaluate ET tube placement  PORTABLE CHEST - 1 VIEW  Comparison: 03/31/2012  Findings: The endotracheal tube tip is above the carina.  There is a right IJ catheter with tip in the projection of the SVC. Bilateral pleural effusions are unchanged. There is a mild interstitial edema.  Large hiatal hernia is again noted.  IMPRESSION:  1.  No change in position of ET tube. 2.  Persistent pleural effusions and mild interstitial edema.   Original Report Authenticated By: Rosealee Albee, M.D.     Anti-infectives: Anti-infectives     Start     Dose/Rate Route Frequency Ordered Stop   04/01/12 0100   vancomycin (VANCOCIN) 1,250 mg in sodium chloride 0.9 % 250 mL IVPB  Status:  Discontinued        1,250 mg 166.7 mL/hr over 90 Minutes Intravenous Every 24 hours 03/31/12 0006 03/31/12 1604   03/31/12 1630  piperacillin-tazobactam (ZOSYN) IVPB 3.375 g       3.375 g 12.5 mL/hr over 240 Minutes Intravenous Every 8 hours 03/31/12 1600     03/31/12 0100   vancomycin (VANCOCIN) 250 mg in sodium chloride 0.9 % 100 mL IVPB        250 mg 100 mL/hr over 60 Minutes Intravenous  Once 03/31/12 0028 03/31/12 0227   03/28/12 2200   cefTAZidime (FORTAZ) injection 2 g  Status:  Discontinued        2 g Intramuscular Every 12 hours 03/28/12 1613 03/28/12 1619   03/28/12 1700   metroNIDAZOLE (FLAGYL) IVPB 500 mg  Status:  Discontinued        500 mg 100 mL/hr over 60 Minutes Intravenous Every 8 hours 03/28/12 1613 03/31/12 1604   03/28/12 1700   cefTAZidime (FORTAZ) 2 g in dextrose 5 % 50 mL IVPB  Status:  Discontinued        2 g 100 mL/hr over 30 Minutes Intravenous Every 12 hours 03/28/12 1619 03/31/12 1604   03/27/12 2300   vancomycin (VANCOCIN) IVPB 1000 mg/200 mL premix  Status:  Discontinued        1,000 mg 200 mL/hr over 60 Minutes Intravenous Every 24 hours 03/27/12  1135 03/31/12 0004   03/26/12 0600   piperacillin-tazobactam (ZOSYN) IVPB 3.375 g  Status:  Discontinued        3.375 g 12.5 mL/hr over 240 Minutes Intravenous 3 times per day 03/26/12 0116 03/28/12 1618   03/26/12 0130  piperacillin-tazobactam (ZOSYN) IVPB 3.375 g       3.375 g 100 mL/hr over 30 Minutes Intravenous  Once 03/26/12 0109 03/26/12 0402   03/26/12 0115   piperacillin-tazobactam (ZOSYN) IVPB  3.375 g  Status:  Discontinued        3.375 g 12.5 mL/hr over 240 Minutes Intravenous 3 times per day 03/26/12 0115 03/26/12 0116   03/25/12 2300   vancomycin (VANCOCIN) 750 mg in sodium chloride 0.9 % 150 mL IVPB  Status:  Discontinued        750 mg 150 mL/hr over 60 Minutes Intravenous Every 24 hours 03/25/12 2240 03/27/12 1135   03/25/12 2300   ceFEPIme (MAXIPIME) 1 g in dextrose 5 % 50 mL IVPB  Status:  Discontinued        1 g 100 mL/hr over 30 Minutes Intravenous Every 24 hours 03/25/12 2240 03/26/12 0108   03/25/12 1630   cefTRIAXone (ROCEPHIN) 1 g in dextrose 5 % 50 mL IVPB  Status:  Discontinued        1 g 100 mL/hr over 30 Minutes Intravenous Every 24 hours 03/25/12 1619 03/25/12 2225           Assessment/Plan  1. Cholangitis with obstructive jaundice due to biliary stones in the distal common bile duct: per Dr. Janee Morn, Continue drain to allow sepsis to resolve.  Once well, GI or IR approach to clear large CBD stones.   Will continue to follow.     LOS: 9 days    Yalda Herd 04/02/2012

## 2012-04-02 NOTE — Progress Notes (Signed)
Noted GI input.  Dr. Colletta [place] is discussing plans for IR approach.  This is best for the patient in light of her anatomy and COPD.  Drain in place has allowed sepsis to clear. Patient examined and I agree with the assessment and plan  Violeta Gelinas, MD, MPH, FACS Pager: [phone]  04/02/2012 8:54 AM

## 2012-04-02 NOTE — Progress Notes (Signed)
Name: Shaneen Reeser MRN: [id number] DOB: 09/23/1940    LOS: 9 days  Referring Provider:  Maryan Puls Reason for Referral:  Shock, hypoxemia  PULMONARY / CRITICAL CARE MEDICINE   Brief patient description:  71 y/o female with prior cholecystectomy, CHF and CAD admitted 8/14 with chest pain developed hypotension, tachycardia and worsening hypoxemic respiratory failure likely in the setting of sepsis likely due to cholangitis vs. Less likely SBO/abdominal process. Final dx: Primary CBD stones with obstructive jaundice, cholangitis, septic shock  Events Since Admission: 8/14 - ECHO ef 65%. PASP 53 8/15 >> s/p CT scan abd, CBD obstruction with probable mass 8/16 >> IR guided biliary (CBD) drain 8/17: Phenylephrine off, opens eyes to voice. Gi signed off 03/30/2012: Off pressors. Still on sedation gtt. Normal WUA. Starting SBT.  03/31/12: IR procedure - dx is gall stones. CCS consult - recommending GI guided ERCP sphinctertom +/- stent as first step and if fails/not possible IR guided approach as next step (CCS feels complicated anantomy for surgical CBD exploration due to anatomy and morbidity)  IR: Successful placement of an internal-external biliary drain.  The cause of the obstructive jaundice is due to biliary stones. There was a very large stone in the distal common bile duct which was mobile throughout the procedure.  Plan:  Plan internal-external biliary drainage until the patient has fully recovered from sepsis and respiratory distress.  Will need GI's opinion about whether ERCP and stone extraction are possible due to the patient's abnormal anatomy.  Otherwise, the patient may be a candidate for percutaneous sphincteroplasty and stone removal.   Original Report Authenticated By: Richarda Overlie, M.D.    8/21 - GI feels ERCP sphincterotomy too anatomically difficult because majority of stomach in chest (herniation and stone too big). REcommending IR ampullary sphincterotomy via IR  8/22 - IR (Dr  Miles Costain) feels current drain track has to mature for 6 weeks before any intervention but he will talk to Dr Lowella Dandy and get back    Current Status:   8/20 - 8/22 - > Failed PSV. Did several hours on PSV yesterday though. Today she says she is tired.  No overnight events.. Diuresing well - negative balance   Vital Signs: Temp:  [98.4 F (36.9 C)-99.2 F (37.3 C)] 99.2 F (37.3 C) (08/22 0728) Pulse Rate:  [73-99] 81  (08/22 0733) Resp:  [11-23] 11  (08/22 0733) BP: (82-157)/(38-87) 116/74 mmHg (08/22 0700) SpO2:  [97 %-100 %] 97 % (08/22 0733) FiO2 (%):  [39.7 %-40.2 %] 40 % (08/22 0822) Weight:  [49.1 kg (108 lb 3.9 oz)] 49.1 kg (108 lb 3.9 oz) (08/22 0500)  Intake/Output Summary (Last 24 hours) at 04/02/12 0838 Last data filed at 04/02/12 0800  Gross per 24 hour  Intake 1828.07 ml  Output   4441 ml  Net -2612.93 ml    Physical Examination: Gen: chronically ill and critically ill appearing, intubated HEENT: NCAT, PERRL, EOMi, ETT in place PULM: Insp crackles in bases bilaterally.  CVS rrr, no mgr,  AB: BS infrequent, soft, less tender on my exam today Ext: warm, no edema, no clubbing, no cyanosis Derm: no rash or skin breakdown per RN Neuro: RASS 0. CAM-ICU negative for delirium    Ir Cholan Exist Tube  03/31/2012  *RADIOLOGY REPORT*  Clinical history:71 year old with obstructive jaundice.  The patient currently has an external biliary drain.  PROCEDURE(S): CHOLANGIOGRAM THROUGH EXISTING TUBE; PLACEMENT OF INTERNAL EXTERNAL BILIARY DRAIN  Physician: Rachelle Hora. Lowella Dandy, MD  Medications:Versed 2 mg IV  Fluoroscopy  time: 5.5 minutes  Contrast:  25 ml Omnipaque-300  Procedure:Informed consent was obtained for a cholangiogram and internalization of biliary drain.  The patient was also consented for a possible brush biopsy.  The patient was placed supine on the interventional table.  The existing biliary drain was prepped and draped in a sterile fashion.  Maximal barrier sterile technique  was utilized including caps, mask, sterile gowns, sterile gloves, sterile drape, hand hygiene and skin antiseptic.  Contrast was injected into the biliary drain.  The biliary catheter was cut and removed over a Bentson wire.  5-French Kumpe catheter was used to perform additional cholangiograms.  Catheter was also advanced into the duodenum.  A Bentson wire was placed in the small bowel and a 10-French internal-external biliary drain was placed over a Bentson wire.  The distal aspect of the drain was reconstituted in the small bowel.  Catheter sutured to the skin and attached to a gravity bag.  Findings:The cholangiogram demonstrated an external biliary drain. The patient's anatomy is very unusual in that the common bile duct extends cephalad and is left of midline.  Cholangiogram demonstrated a very large filling defect in the distal common bile duct consistent with a large stone.  There are additional filling defects throughout the biliary system suggestive for additional stones.  The ampulla is widely patent and contrast is now spontaneously draining into the small bowel.  An underlying stricture or lesion was not identified.  Complications: None  Impression:  Successful placement of an internal-external biliary drain.  The cause of the obstructive jaundice is due to biliary stones. There was a very large stone in the distal common bile duct which was mobile throughout the procedure.  Plan:  Plan internal-external biliary drainage until the patient has fully recovered from sepsis and respiratory distress.  Will need GI's opinion about whether ERCP and stone extraction are possible due to the patient's abnormal anatomy.  Otherwise, the patient may be a candidate for percutaneous sphincteroplasty and stone removal.   Original Report Authenticated By: Richarda Overlie, M.D.    Ir Biliary Drain Catheter Placement  03/31/2012  *RADIOLOGY REPORT*  Clinical history:71 year old with obstructive jaundice.  The patient  currently has an external biliary drain.  PROCEDURE(S): CHOLANGIOGRAM THROUGH EXISTING TUBE; PLACEMENT OF INTERNAL EXTERNAL BILIARY DRAIN  Physician: Rachelle Hora. Henn, MD  Medications:Versed 2 mg IV  Fluoroscopy time: 5.5 minutes  Contrast:  25 ml Omnipaque-300  Procedure:Informed consent was obtained for a cholangiogram and internalization of biliary drain.  The patient was also consented for a possible brush biopsy.  The patient was placed supine on the interventional table.  The existing biliary drain was prepped and draped in a sterile fashion.  Maximal barrier sterile technique was utilized including caps, mask, sterile gowns, sterile gloves, sterile drape, hand hygiene and skin antiseptic.  Contrast was injected into the biliary drain.  The biliary catheter was cut and removed over a Bentson wire.  5-French Kumpe catheter was used to perform additional cholangiograms.  Catheter was also advanced into the duodenum.  A Bentson wire was placed in the small bowel and a 10-French internal-external biliary drain was placed over a Bentson wire.  The distal aspect of the drain was reconstituted in the small bowel.  Catheter sutured to the skin and attached to a gravity bag.  Findings:The cholangiogram demonstrated an external biliary drain. The patient's anatomy is very unusual in that the common bile duct extends cephalad and is left of midline.  Cholangiogram demonstrated a very large  filling defect in the distal common bile duct consistent with a large stone.  There are additional filling defects throughout the biliary system suggestive for additional stones.  The ampulla is widely patent and contrast is now spontaneously draining into the small bowel.  An underlying stricture or lesion was not identified.  Complications: None  Impression:  Successful placement of an internal-external biliary drain.  The cause of the obstructive jaundice is due to biliary stones. There was a very large stone in the distal common bile  duct which was mobile throughout the procedure.  Plan:  Plan internal-external biliary drainage until the patient has fully recovered from sepsis and respiratory distress.  Will need GI's opinion about whether ERCP and stone extraction are possible due to the patient's abnormal anatomy.  Otherwise, the patient may be a candidate for percutaneous sphincteroplasty and stone removal.   Original Report Authenticated By: Richarda Overlie, M.D.    Dg Chest Port 1 View  04/02/2012  *RADIOLOGY REPORT*  Clinical Data: Check ET tube.  PORTABLE CHEST - 1 VIEW  Comparison: 04/01/2012  Findings: Support devices are in stable position.  Endotracheal tube tip is at the carina and could be retracted slightly. There is hyperinflation of the lungs compatible with COPD.  Bilateral lower lobe airspace opacities and layering effusions.  Cardiomegaly.  No real change since prior study.  IMPRESSION: No interval change.  Endotracheal tube is near the carina and could be retracted slightly for optimal positioning.   Original Report Authenticated By: Cyndie Chime, M.D.    Dg Chest Port 1 View  04/01/2012  *RADIOLOGY REPORT*  Clinical Data: Evaluate ET tube placement  PORTABLE CHEST - 1 VIEW  Comparison: 03/31/2012  Findings: The endotracheal tube tip is above the carina.  There is a right IJ catheter with tip in the projection of the SVC. Bilateral pleural effusions are unchanged. There is a mild interstitial edema.  Large hiatal hernia is again noted.  IMPRESSION:  1.  No change in position of ET tube. 2.  Persistent pleural effusions and mild interstitial edema.   Original Report Authenticated By: Rosealee Albee, M.D.      Principal Problem:  *Septic shock Active Problems:  Acute respiratory failure with hypoxia  Acute cholangitis  Suicidal ideation  Chest pain syndrome  CAD (coronary artery disease)  COPD (chronic obstructive pulmonary disease)  Anxiety  CHF (congestive heart failure)  Vitamin D deficiency disease  UTI  (lower urinary tract infection)  Hypotension  Fever   ASSESSMENT AND PLAN  PULMONARY  Lab 03/29/12 0339 03/28/12 0331  PHART 7.414 7.365  PCO2ART 32.4* 32.6*  PO2ART 95.5 142.0*  HCO3 20.3 18.7*  O2SAT 98.2 99.0   Ventilator Settings: Vent Mode:  [-] PRVC FiO2 (%):  [39.7 %-40.2 %] 40 % Set Rate:  [14 bmp] 14 bmp Vt Set:  [0 mL-450 mL] 450 mL PEEP:  [5 cmH20] 5 cmH20 Pressure Support:  [5 cmH20-15 cmH20] 15 cmH20 Plateau Pressure:  [12 cmH20-16 cmH20] 12 cmH20 CXR:  Bilateral effusions and pulm edema unchanged 8/17 ETT:  8/15 >>  A:  Acute on chronic hypoxemic respiratory failure due to pulmonary edema, effusions; COPD on 3 L O2 at home  - on 04/02/2012: Meets SBT criteria but failed SBT on 5/5 and 10/5.  CXR still with effusions and congested  P:  Full vent support Continue diuresis Extubate when medically ready  CARDIOVASCULAR  Lab 04/01/12 0415 03/28/12 0415 03/27/12 0400  TROPONINI -- -- --  LATICACIDVEN -- 1.7  2.5*  PROBNP 2793.0* -- --   ECG:  Sinus tach, peaked T waves, no ST wave changes Lines:  03/26/12 R IJ CVL >> 03/25/12 2D TTE LVEF 60-65%, elevated PA pressure  A: Septic shock and currently volume overloaded based on physical exam and CXR; question mixed shock (cardiogenic?) given exam findings; CVP 8/17 am = 8-9. S/p EGDT CAD but ruled out for MI, 03/25/12 TTE normal Lactate 2.5 >> 2.5 on 8/16   - on 04/02/2012 and since 8/19 atleast: normal BP   P:  -monitor -see ID section - continue diuresis since  04/01/12    RENAL  Lab 04/02/12 0414 04/01/12 0415 03/31/12 0419 03/30/12 0424 03/29/12 0345  NA 145 148* 149* 141 131*  K 3.9 3.2* -- -- --  CL 105 110 116* 109 100  CO2 37* 32 27 25 22   BUN 15 18 26* 36* 35*  CREATININE 0.50 0.43* 0.44* 0.55 0.56  CALCIUM 8.5 8.3* 9.2 9.0 8.5  MG 2.3 1.9 2.1 -- --  PHOS 2.6 2.9 1.2* -- --   Intake/Output      08/21 0701 - 08/22 0700 08/22 0701 - 08/23 0700   I.V. (mL/kg) 717.1 (14.6) 20 (0.4)     NG/GT 990 35   IV Piggyback 151 12.5   Total Intake(mL/kg) 1858.1 (37.8) 67.5 (1.4)   Urine (mL/kg/hr) 3515 (3)    Drains 1000    Stool 2    Total Output 4517    Net -2658.9 +67.5         Foley:  03/25/12  A:  Oliguria, and Hyponatremia all resolved 8/19  - on 04/02/2012: Normal lytes P:   -kvo d5 water - monitor bmp  GASTROINTESTINAL  Lab 03/31/12 0419 03/28/12 0415 03/27/12 0400  AST 18 66* 76*  ALT 49* 119* 139*  ALKPHOS 140* 170* 156*  BILITOT 2.0* 4.5* 4.5*  PROT 4.7* 4.7* 4.6*  ALBUMIN 2.1* 2.1* 2.0*    A:  Elevated transaminases and alk phos, improving,due to cholangitis (hx chole), CBD obstruction due to Primary Gall stone. Anatomy complicated by huge hiatal hernia. HX of multiple lap  In past   -8/16 external biliary drain - 817  Start tube feeds  - on 03/31/12:  IR procedure - gall stones blocking CBD. No evidence of malignancy. S/p internal external biliary drain.  - 8/22 - GI and CCS opinion: procedure to remove primary bile duct not technically feasible  P:   - Awaiting IR decision and timing of IR directed ampullary sphincterotomy (call back pending from Dr Lowella Dandy or Schick)  HEMATOLOGIC  Lab 04/02/12 9604 04/01/12 0415 03/31/12 0419 03/30/12 0424 03/29/12 0345 03/28/12 2100 03/28/12 0415 03/27/12 0400 03/26/12 1215  HGB 8.6* 9.2* 10.4* 10.0* 8.9* -- -- -- --  HCT 26.0* 27.4* 30.4* 29.3* 26.2* -- -- -- --  PLT 144* 96* 68* 45* 23* -- -- -- --  INR -- -- -- -- -- 1.07 1.12 1.22 1.47  APTT -- -- -- -- -- 30 -- -- 37   A:  Thrombocytopenia - likely sepsis related (not on heparin, not in DIC)       Anemia of critical illness  - on 04/02/2012: relative stability   P:   - PRBC only for Hgb < 7gm% unless actively bleeding - monitor platelent and bleeding  INFECTIOUS  Lab 04/02/12 0414 04/01/12 0415 03/31/12 0419 03/30/12 0424 03/29/12 0345 03/27/12 0400  WBC 16.0* 19.1* 22.3* 19.0* 14.3* --  PROCALCITON 1.10 -- -- -- -- 39.94  Cultures: 03/25/12  blood >> ecoli +  ENterococcus 03/25/12 urine >> COAG NEG STAPH  8/16 Biliary drain >> E colii and Enterococcus +  Results for orders placed during the hospital encounter of 03/24/12  URINE CULTURE     Status: Normal   Collection Time   03/25/12  3:28 PM      Component Value Range Status Comment   Specimen Description URINE, RANDOM   Final    Special Requests NONE   Final    Culture  Setup Time 03/25/2012 15:54   Final    Colony Count 65,000 COLONIES/ML   Final    Culture     Final    Value: STAPHYLOCOCCUS SPECIES (COAGULASE NEGATIVE)     Note: RIFAMPIN AND GENTAMICIN SHOULD NOT BE USED AS SINGLE DRUGS FOR TREATMENT OF STAPH INFECTIONS.   Report Status 03/28/2012 FINAL   Final    Organism ID, Bacteria STAPHYLOCOCCUS SPECIES (COAGULASE NEGATIVE)   Final   CULTURE, BLOOD (ROUTINE X 2)     Status: Normal   Collection Time   03/25/12  3:55 PM      Component Value Range Status Comment   Specimen Description BLOOD LEFT HAND   Final    Special Requests BOTTLES DRAWN AEROBIC ONLY 5CC   Final    Culture  Setup Time 03/25/2012 20:33   Final    Culture     Final    Value: ESCHERICHIA COLI     ENTEROCOCCUS GALLINARUM     Note: Gram Stain Report Called to,Read Back By and Verified With: CARRIE H @1021  03/26/12 BY KRAWS   Report Status 03/31/2012 FINAL   Final    Organism ID, Bacteria ESCHERICHIA COLI   Final    Organism ID, Bacteria ENTEROCOCCUS GALLINARUM   Final   CULTURE, BLOOD (ROUTINE X 2)     Status: Normal   Collection Time   03/25/12  4:00 PM      Component Value Range Status Comment   Specimen Description BLOOD LEFT ARM   Final    Special Requests BOTTLES DRAWN AEROBIC ONLY 5CC   Final    Culture  Setup Time 03/25/2012 20:33   Final    Culture     Final    Value: ESCHERICHIA COLI     Note: SUSCEPTIBILITIES PERFORMED ON PREVIOUS CULTURE WITHIN THE LAST 5 DAYS.     ENTEROCOCCUS GALLINARUM     Note: Gram Stain Report Called to,Read Back By and Verified With: CARRIE H @1021  03/26/12 BY  KRAWS   Report Status 04/01/2012 FINAL   Final   MRSA PCR SCREENING     Status: Normal   Collection Time   03/25/12  9:10 PM      Component Value Range Status Comment   MRSA by PCR NEGATIVE  NEGATIVE Final   ANAEROBIC CULTURE     Status: Normal   Collection Time   03/27/12 12:55 PM      Component Value Range Status Comment   Specimen Description FLUID BILE   Final    Special Requests NONE   Final    Gram Stain     Final    Value: NO WBC SEEN     NO ORGANISMS SEEN   Culture NO ANAEROBES ISOLATED   Final    Report Status 04/01/2012 FINAL   Final   BODY FLUID CULTURE     Status: Normal   Collection Time   03/27/12 12:55 PM      Component Value Range Status Comment  Specimen Description FLUID BILE   Final    Special Requests NONE   Final    Gram Stain     Final    Value: NO WBC SEEN     NO ORGANISMS SEEN   Culture     Final    Value: ABUNDANT ESCHERICHIA COLI     MODERATE ENTEROCOCCUS GALLINARUM     Note: CRITICAL RESULT CALLED TO, READ BACK BY AND VERIFIED WITH: CARRIE H @1259  03/31/12 BY KRAWS   Report Status 03/31/2012 FINAL   Final    Organism ID, Bacteria ESCHERICHIA COLI   Final    Organism ID, Bacteria ENTEROCOCCUS GALLINARUM   Final   VRE CULTURE     Status: Normal (Preliminary result)   Collection Time   03/29/12  5:16 AM      Component Value Range Status Comment   Specimen Description STOOL   Final    Special Requests NONE   Final    Culture Culture reincubated for better growth   Final    Report Status PENDING   Incomplete      Antibiotics: 8/14 zosyn (UTI, Cholangitis)>>8/17 8/14 vanc (GPC's in blood) >> 820 8/17 ceftaz (ecoli bacteremia) >> 8/17 flagyl (cholangitis, bacteremia)>>  Anti-infectives     Start     Dose/Rate Route Frequency Ordered Stop   04/01/12 0100   vancomycin (VANCOCIN) 1,250 mg in sodium chloride 0.9 % 250 mL IVPB  Status:  Discontinued        1,250 mg 166.7 mL/hr over 90 Minutes Intravenous Every 24 hours 03/31/12 0006 03/31/12 1604    03/31/12 1630  piperacillin-tazobactam (ZOSYN) IVPB 3.375 g       3.375 g 12.5 mL/hr over 240 Minutes Intravenous Every 8 hours 03/31/12 1600     03/31/12 0100   vancomycin (VANCOCIN) 250 mg in sodium chloride 0.9 % 100 mL IVPB        250 mg 100 mL/hr over 60 Minutes Intravenous  Once 03/31/12 0028 03/31/12 0227   03/28/12 2200   cefTAZidime (FORTAZ) injection 2 g  Status:  Discontinued        2 g Intramuscular Every 12 hours 03/28/12 1613 03/28/12 1619   03/28/12 1700   metroNIDAZOLE (FLAGYL) IVPB 500 mg  Status:  Discontinued        500 mg 100 mL/hr over 60 Minutes Intravenous Every 8 hours 03/28/12 1613 03/31/12 1604   03/28/12 1700   cefTAZidime (FORTAZ) 2 g in dextrose 5 % 50 mL IVPB  Status:  Discontinued        2 g 100 mL/hr over 30 Minutes Intravenous Every 12 hours 03/28/12 1619 03/31/12 1604   03/27/12 2300   vancomycin (VANCOCIN) IVPB 1000 mg/200 mL premix  Status:  Discontinued        1,000 mg 200 mL/hr over 60 Minutes Intravenous Every 24 hours 03/27/12 1135 03/31/12 0004   03/26/12 0600   piperacillin-tazobactam (ZOSYN) IVPB 3.375 g  Status:  Discontinued        3.375 g 12.5 mL/hr over 240 Minutes Intravenous 3 times per day 03/26/12 0116 03/28/12 1618   03/26/12 0130  piperacillin-tazobactam (ZOSYN) IVPB 3.375 g       3.375 g 100 mL/hr over 30 Minutes Intravenous  Once 03/26/12 0109 03/26/12 0402   03/26/12 0115   piperacillin-tazobactam (ZOSYN) IVPB 3.375 g  Status:  Discontinued        3.375 g 12.5 mL/hr over 240 Minutes Intravenous 3 times per day 03/26/12 0115 03/26/12 0116  03/25/12 2300   vancomycin (VANCOCIN) 750 mg in sodium chloride 0.9 % 150 mL IVPB  Status:  Discontinued        750 mg 150 mL/hr over 60 Minutes Intravenous Every 24 hours 03/25/12 2240 03/27/12 1135   03/25/12 2300   ceFEPIme (MAXIPIME) 1 g in dextrose 5 % 50 mL IVPB  Status:  Discontinued        1 g 100 mL/hr over 30 Minutes Intravenous Every 24 hours 03/25/12 2240 03/26/12 0108    03/25/12 1630   cefTRIAXone (ROCEPHIN) 1 g in dextrose 5 % 50 mL IVPB  Status:  Discontinued        1 g 100 mL/hr over 30 Minutes Intravenous Every 24 hours 03/25/12 1619 03/25/12 2225           A:  Septic shock, ddx UTI, cholangitis with classic gall stone organism due to primary gall stones. S/p egdt  P:   -cont abx as above ---monitor biliary drain output    ENDOCRINE  Lab 04/02/12 0726 04/02/12 0411 04/02/12 0008 04/01/12 1951 04/01/12 1548  GLUCAP 138* 114* 128* 119* 143*   A:  Hyperglycemia  P:   -ICU hyperglycemia protocol  NEUROLOGIC  A:  Suicidality on 8/14  - on 03/30/12 abnd 04/03/12: Normal WUA  P:    - change to prn sedation starting 03/30/12; avoid benzo -psych to follow up after critical illness  BEST PRACTICE / DISPOSITION Level of Care:  ICU Primary Service:  PCCM Consultants:  GI, IR, CCS. - on 8/22//13 - discussed extensively on phone with Dr Madilyn Fireman GI, Dr Janee Morn CCS and Dr Denny Levy IR Code Status:  full Diet:  npo DVT Px:  lovenox off GI Px:  ppi Skin Integrity:  normal Social / Family:  Updated son and daughter at bedside 8/15. None at bedside 8/19 and 03/31/12. Son DPOA updated over phone 04/01/12 and at bedside 8.22.13    The patient is critically ill with multiple organ systems failure and requires high complexity decision making for assessment and support, frequent evaluation and titration of therapies, application of advanced monitoring technologies and extensive interpretation of multiple databases.   Critical Care Time devoted to patient care services described in this note is 60 Minutes.  Dr. Kalman Shan, M.D., [facility].C.P Pulmonary and Critical Care Medicine Staff Physician [facility] Pager: [phone], If no answer or between  15:00h - 7:00h: call [phone]  04/02/2012 8:38 AM

## 2012-04-03 ENCOUNTER — Inpatient Hospital Stay (HOSPITAL_COMMUNITY): Payer: Medicare Other

## 2012-04-03 DIAGNOSIS — K8309 Other cholangitis: Secondary | ICD-10-CM

## 2012-04-03 LAB — GLUCOSE, CAPILLARY
Glucose-Capillary: 101 mg/dL — ABNORMAL HIGH (ref 70–99)
Glucose-Capillary: 120 mg/dL — ABNORMAL HIGH (ref 70–99)
Glucose-Capillary: 128 mg/dL — ABNORMAL HIGH (ref 70–99)
Glucose-Capillary: 94 mg/dL (ref 70–99)

## 2012-04-03 LAB — BASIC METABOLIC PANEL
GFR calc Af Amer: >90 mL/min (ref >90)
GFR calc non Af Amer: >90 mL/min (ref >90)
Glucose, Bld: 132 mg/dL — ABNORMAL HIGH (ref 70–99)
Potassium: 3.7 mEq/L (ref 3.5–5.1)
Sodium: 139 mEq/L (ref 135–145)

## 2012-04-03 MED ORDER — POTASSIUM CHLORIDE 20 MEQ/15ML (10%) PO LIQD: 20.0000 meq | Freq: Every day | ORAL | Status: DC

## 2012-04-03 MED ORDER — INSULIN ASPART 100 UNIT/ML ~~LOC~~ SOLN
0.0000 [IU] | Freq: Three times a day (TID) | SUBCUTANEOUS | Status: DC
  Administered 2012-04-05: 13:00:00 via SUBCUTANEOUS
  Administered 2012-04-05: 1 [IU] via SUBCUTANEOUS
  Administered 2012-04-06 – 2012-04-07 (×2): 2 [IU] via SUBCUTANEOUS
  Administered 2012-04-08: 3 [IU] via SUBCUTANEOUS

## 2012-04-03 MED ORDER — FUROSEMIDE 10 MG/ML IJ SOLN
20.0000 mg | Freq: Every day | INTRAMUSCULAR | Status: DC
  Administered 2012-04-04 – 2012-04-08 (×5): 20 mg via INTRAVENOUS
  Filled 2012-04-03 (×5): qty 2

## 2012-04-03 MED ORDER — BOOST / RESOURCE BREEZE PO LIQD
1.0000 | Freq: Three times a day (TID) | ORAL | Status: DC
  Administered 2012-04-03 – 2012-04-08 (×10): 1 via ORAL

## 2012-04-03 MED ORDER — PANTOPRAZOLE SODIUM 40 MG PO PACK
40.0000 mg | PACK | ORAL | Status: DC
  Filled 2012-04-03: qty 20

## 2012-04-03 MED ORDER — FENTANYL CITRATE 0.05 MG/ML IJ SOLN: 12.5000 ug | INTRAMUSCULAR | Status: DC | PRN

## 2012-04-03 MED ORDER — POTASSIUM CHLORIDE CRYS ER 20 MEQ PO TBCR
20.0000 meq | EXTENDED_RELEASE_TABLET | Freq: Every day | ORAL | Status: DC
  Administered 2012-04-04 – 2012-04-08 (×5): 20 meq via ORAL
  Filled 2012-04-03 (×7): qty 1

## 2012-04-03 MED ORDER — ADULT MULTIVITAMIN W/MINERALS CH
1.0000 | ORAL_TABLET | Freq: Every day | ORAL | Status: DC
  Administered 2012-04-04 – 2012-04-08 (×5): 1 via ORAL
  Filled 2012-04-03 (×6): qty 1

## 2012-04-03 MED ORDER — ALBUTEROL SULFATE HFA 108 (90 BASE) MCG/ACT IN AERS
2.0000 | INHALATION_SPRAY | RESPIRATORY_TRACT | Status: DC | PRN
  Filled 2012-04-03: qty 6.7

## 2012-04-03 MED ORDER — PANTOPRAZOLE SODIUM 40 MG PO TBEC
40.0000 mg | DELAYED_RELEASE_TABLET | Freq: Every day | ORAL | Status: DC
  Administered 2012-04-04 – 2012-04-08 (×5): 40 mg via ORAL
  Filled 2012-04-03 (×5): qty 1

## 2012-04-03 MED ORDER — ALBUTEROL SULFATE HFA 108 (90 BASE) MCG/ACT IN AERS
2.0000 | INHALATION_SPRAY | RESPIRATORY_TRACT | Status: DC
Start: 2012-04-03 — End: 2012-04-04
  Administered 2012-04-03 – 2012-04-04 (×5): 2 via RESPIRATORY_TRACT
  Filled 2012-04-03: qty 6.7

## 2012-04-03 NOTE — Progress Notes (Addendum)
Progress note following pt with s/p respiratory distress and patient. Today he ate/2313 it is noted that patient has had successful extubation. Weekend coverage for consultation will be notified. DIAGNOSIS:    AXIS I:  Maj. depressive disorder recurrent, severe with suicidal ideation, depression due to complex medical conditions Axis II:  Deferred Axis IV  financial factors,  enmeshment with children's adult lives, medical problems; obstructive cholecystitis, septic shock, uncontrolled anxiety Axis V:  GAF 41-50  severe problems  Assessment and plan: Patient has been followed for acute respiratory arrest and intubation. No extubation psychiatry consultation will continue. RECOMMENDATION: 1  request consultation coverage to see the patient. 2. Will follow patient. Laurie Seelman J. Ferol Luz, MD Psychiatrist  04/03/2012 11:36 PM

## 2012-04-03 NOTE — Progress Notes (Signed)
This RN called to pt room by respiratory.  RT was ambu bagging pt due pt not pulling high volumes.  Pt was hyper-oxygenated and then deep suctioned using suction catheter.  Deep suctioning was repeated and copious amounts of thick, white secretions were removed.  Pt placed back on ventilator without further issues and pulling higher volumes.  Will continue to monitor pt.

## 2012-04-03 NOTE — Progress Notes (Signed)
Nutrition Follow-up  Intervention:    Resource Breeze supplement 3 times daily (250 kcals, 9 gm protein per 8 fl oz carton) RD to follow for nutrition care plan  Assessment:   Patient extubated this AM. EN via OGT discontinued with extubation. No edema or skin breakdown. Mg, Phos, K+ WNL. Noted to remain NPO x 8 hours then advance to Clear Liquids.  Diet Order:  NPO  Meds: Scheduled Meds:   . albuterol  4 puff Inhalation Q4H  . antiseptic oral rinse  15 mL Mouth Rinse BID  . antiseptic oral rinse  15 mL Mouth Rinse QID  . aspirin EC  81 mg Oral Daily  . chlorhexidine  15 mL Mouth Rinse BID  . furosemide  20 mg Intravenous Daily  . Gerhardt's butt cream   Topical Daily  . insulin aspart  0-9 Units Subcutaneous TID WC  . multivitamin with minerals  1 tablet Oral Daily  . pantoprazole  40 mg Oral Q1200  . piperacillin-tazobactam (ZOSYN)  IV  3.375 g Intravenous Q8H  . potassium chloride  20 mEq Oral Daily  . sodium chloride  10-40 mL Intracatheter Q12H  . DISCONTD: docusate  100 mg Oral BID  . DISCONTD: furosemide  20 mg Intravenous Q8H  . DISCONTD: insulin aspart  0-15 Units Subcutaneous Q4H  . DISCONTD: insulin glargine  5 Units Subcutaneous QHS  . DISCONTD: multivitamin with minerals  1 tablet Per Tube Daily  . DISCONTD: pantoprazole sodium  40 mg Oral BID AC  . DISCONTD: pantoprazole sodium  40 mg Oral Q24H  . DISCONTD: potassium chloride  20 mEq Per Tube TID  . DISCONTD: potassium chloride  20 mEq Per Tube Daily   Continuous Infusions:   . dextrose    . DISCONTD: dextrose 20 mL/hr at 04/03/12 0012  . DISCONTD: feeding supplement (VITAL AF 1.2 CAL) 1,000 mL (04/02/12 1016)   PRN Meds:.albuterol, albuterol, dextrose, fentaNYL, fentaNYL, ipratropium, metoCLOPramide (REGLAN) injection, nitroGLYCERIN, sodium chloride, DISCONTD: acetaminophen, DISCONTD: acetaminophen  Labs:  CMP     Component Value Date/Time   NA 139 04/03/2012 0500   K 3.7 04/03/2012 0500   CL 100  04/03/2012 0500   CO2 35* 04/03/2012 0500   GLUCOSE 132* 04/03/2012 0500   BUN 17 04/03/2012 0500   CREATININE 0.45* 04/03/2012 0500   CALCIUM 8.5 04/03/2012 0500   PROT 4.7* 04/02/2012 0414   ALBUMIN 2.1* 04/02/2012 0414   AST 18 04/02/2012 0414   ALT 29 04/02/2012 0414   ALKPHOS 98 04/02/2012 0414   BILITOT 1.1 04/02/2012 0414   GFRNONAA >90 04/03/2012 0500   GFRAA >90 04/03/2012 0500    Magnesium  Date Value Range Status  04/02/2012 2.3  1.5 - 2.5 mg/dL Final   Phosphorus  Date Value Range Status  04/02/2012 2.6  2.3 - 4.6 mg/dL Final     Intake/Output Summary (Last 24 hours) at 04/03/12 1022 Last data filed at 04/03/12 0800  Gross per 24 hour  Intake 1610.5 ml  Output   3625 ml  Net -2014.5 ml    CBG (last 3)   Basename 04/03/12 0814 04/03/12 0409 04/03/12 0033  GLUCAP 128* 111* 120*    Weight Status:  45.8 kg (8/23) -- fluctuating   Re-estimated needs: 1400-1600 kcals, 80-90 gm protein  Nutrition Dx: Inadequate Oral Intake r/t inability to eat, s/p extubation as evidenced by NPO status, ongoing   New Goal: Oral intake to meet >/= 90% of estimated nutrition needs, currently unmet  Monitor: PO intake, weight,  labs, I/O's   Kirkland Hun, RD, LDN  Pager #: [phone]  After-Hours Pager #: [phone]

## 2012-04-03 NOTE — Progress Notes (Signed)
ANTIBIOTIC CONSULT NOTE-PROGRESS NOTE  Pharmacy Consult for Zosyn Indication: Cholangitis, possible UTI  Hospital Problems Principal Problem:  *Septic shock Active Problems:  Chest pain syndrome  CAD (coronary artery disease)  COPD (chronic obstructive pulmonary disease)  Anxiety  CHF (congestive heart failure)  Vitamin D deficiency disease  Suicidal ideation  UTI (lower urinary tract infection)  Hypotension  Fever  Acute respiratory failure with hypoxia  Acute cholangitis   Patient Measurements: Height: 5\' 1"  (154.9 cm) Weight: 100 lb 15.5 oz (45.8 kg) IBW/kg (Calculated) : 47.8   Vital Signs: BP 126/52  Pulse 73  Temp 98.4 F (36.9 C) (Oral)  Resp 24  Ht 5\' 1"  (1.549 m)  Wt 100 lb 15.5 oz (45.8 kg)  BMI 19.08 kg/m2  SpO2 100%  Intake/Output from previous day: 08/22 0701 - 08/23 0700 In: 1808 [I.V.:480; NG/GT:1170; IV Piggyback:158] Out: 4200 [Urine:3550; Drains:650]  Labs:  [facility] 04/03/12 0500 04/02/12 0414 04/01/12 0415  WBC -- 16.0* 19.1*  HGB -- 8.6* 9.2*  PLT -- 144* 96*  CREATININE 0.45* 0.50 0.43*   Estimated Creatinine Clearance: 46.6 ml/min (by C-G formula based on Cr of 0.45).  BUN/Cr/glu/ALT/AST/amyl/lip:  17/0.45/--/--/--/--/-- (08/23 0500)    Microbiology: Recent Results (from the past 720 hour(s))  URINE CULTURE     Status: Normal   Collection Time   03/25/12  3:28 PM      Component Value Range Status Comment   Specimen Description URINE, RANDOM   Final    Special Requests NONE   Final    Culture  Setup Time 03/25/2012 15:54   Final    Colony Count 65,000 COLONIES/ML   Final    Culture     Final    Value: STAPHYLOCOCCUS SPECIES (COAGULASE NEGATIVE)     Note: RIFAMPIN AND GENTAMICIN SHOULD NOT BE USED AS SINGLE DRUGS FOR TREATMENT OF STAPH INFECTIONS.   Report Status 03/28/2012 FINAL   Final    Organism ID, Bacteria STAPHYLOCOCCUS SPECIES (COAGULASE NEGATIVE)   Final   CULTURE, BLOOD (ROUTINE X 2)     Status: Normal   Collection  Time   03/25/12  3:55 PM      Component Value Range Status Comment   Specimen Description BLOOD LEFT HAND   Final    Special Requests BOTTLES DRAWN AEROBIC ONLY 5CC   Final    Culture  Setup Time 03/25/2012 20:33   Final    Culture     Final    Value: ESCHERICHIA COLI     ENTEROCOCCUS GALLINARUM     Note: Gram Stain Report Called to,Read Back By and Verified With: CARRIE H @1021  03/26/12 BY KRAWS   Report Status 03/31/2012 FINAL   Final    Organism ID, Bacteria ESCHERICHIA COLI   Final    Organism ID, Bacteria ENTEROCOCCUS GALLINARUM   Final   CULTURE, BLOOD (ROUTINE X 2)     Status: Normal   Collection Time   03/25/12  4:00 PM      Component Value Range Status Comment   Specimen Description BLOOD LEFT ARM   Final    Special Requests BOTTLES DRAWN AEROBIC ONLY 5CC   Final    Culture  Setup Time 03/25/2012 20:33   Final    Culture     Final    Value: ESCHERICHIA COLI     Note: SUSCEPTIBILITIES PERFORMED ON PREVIOUS CULTURE WITHIN THE LAST 5 DAYS.     ENTEROCOCCUS GALLINARUM     Note: Gram Stain Report Called to,Read Back By  and Verified With: CARRIE H @1021  03/26/12 BY KRAWS   Report Status 04/01/2012 FINAL   Final   MRSA PCR SCREENING     Status: Normal   Collection Time   03/25/12  9:10 PM      Component Value Range Status Comment   MRSA by PCR NEGATIVE  NEGATIVE Final   ANAEROBIC CULTURE     Status: Normal   Collection Time   03/27/12 12:55 PM      Component Value Range Status Comment   Specimen Description FLUID BILE   Final    Special Requests NONE   Final    Gram Stain     Final    Value: NO WBC SEEN     NO ORGANISMS SEEN   Culture NO ANAEROBES ISOLATED   Final    Report Status 04/01/2012 FINAL   Final   BODY FLUID CULTURE     Status: Normal   Collection Time   03/27/12 12:55 PM      Component Value Range Status Comment   Specimen Description FLUID BILE   Final    Special Requests NONE   Final    Gram Stain     Final    Value: NO WBC SEEN     NO ORGANISMS SEEN    Culture     Final    Value: ABUNDANT ESCHERICHIA COLI     MODERATE ENTEROCOCCUS GALLINARUM     Note: CRITICAL RESULT CALLED TO, READ BACK BY AND VERIFIED WITH: CARRIE H @1259  03/31/12 BY KRAWS   Report Status 03/31/2012 FINAL   Final    Organism ID, Bacteria ESCHERICHIA COLI   Final    Organism ID, Bacteria ENTEROCOCCUS GALLINARUM   Final   VRE CULTURE     Status: Normal (Preliminary result)   Collection Time   03/29/12  5:16 AM      Component Value Range Status Comment   Specimen Description STOOL   Final    Special Requests NONE   Final    Culture Culture reincubated for better growth   Final    Report Status PENDING   Incomplete     Current Anti-infectives Anti-infectives     Start     Dose/Rate Route Frequency Ordered Stop   03/31/12 1630   piperacillin-tazobactam (ZOSYN) IVPB 3.375 g        3.375 g 12.5 mL/hr over 240 Minutes Intravenous Every 8 hours 03/31/12 1600            Assessment:  Day # 4 Zosyn [after restart-Total of 7 days Zosyn out of 10 days of antibiotic therapy] for cholangitis and septic shock.  04/02/12 PCT remains elevated 1.1.   She is afebrile.  Last WBC 16, oliguria and electrolyte disturbances resolved.   Goal of Therapy:   Continued antibiotic coverage until elevated PCT drops to wnl..   Plan:   Continue Zosyn 3.375 gm IV q 8 hours, each dose to infuse over 4 hours.  Follow-up final cultures, repeat PCT.  Laurena Bering, Pharm.D.  04/03/2012 12:05 PM

## 2012-04-03 NOTE — Progress Notes (Signed)
Name: Laurie Sparks MRN: [id number] DOB: [birth year]    LOS: 10 days  Referring Provider:  Maryan Puls Reason for Referral:  Shock, hypoxemia  PULMONARY / CRITICAL CARE MEDICINE   Brief patient description:  71 y/o female with prior cholecystectomy, CHF and CAD admitted 8/14 with chest pain developed hypotension, tachycardia and worsening hypoxemic respiratory failure likely in the setting of sepsis likely due to cholangitis vs. Less likely SBO/abdominal process. Final dx: Primary CBD stones with obstructive jaundice, cholangitis, septic shock  Events Since Admission: 8/14 - ECHO ef 65%. PASP 53 8/15 >> s/p CT scan abd, CBD obstruction with probable mass 8/16 >> IR guided biliary (CBD) drain 8/17: Phenylephrine off, opens eyes to voice. Gi signed off 03/30/2012: Off pressors. Still on sedation gtt. Normal WUA. Starting SBT.  03/31/12: IR procedure - dx is gall stones. CCS consult - recommending GI guided ERCP sphinctertom +/- stent as first step and if fails/not possible IR guided approach as next step (CCS feels complicated anantomy for surgical CBD exploration due to anatomy and morbidity)  IR: Successful placement of an internal-external biliary drain.  The cause of the obstructive jaundice is due to biliary stones. There was a very large stone in the distal common bile duct which was mobile throughout the procedure.  Plan:  Plan internal-external biliary drainage until the patient has fully recovered from sepsis and respiratory distress.  Will need GI's opinion about whether ERCP and stone extraction are possible due to the patient's abnormal anatomy.  Otherwise, the patient may be a candidate for percutaneous sphincteroplasty and stone removal.   Original Report Authenticated By: Richarda Overlie, M.D.    8/21 - GI feels ERCP sphincterotomy too anatomically difficult because majority of stomach in chest (herniation and stone too big). REcommending IR ampullary sphincterotomy via IR  8/22 - IR: Allow  pt to improve and stabilize clinically and plan for elective attempt at balloon sphincteroplasty and stone removal in about 2 weeks. FAiled PSV  Current Status:   8/23: Doing well on PSV 5/5 past hour. Though earlier this am had to be bagged due to mucus plugging. IR notes noted. . Diuresing well - negative balance. AFebrile   Vital Signs: Temp:  [98 F (36.7 C)-98.8 F (37.1 C)] 98.4 F (36.9 C) (08/23 0752) Pulse Rate:  [70-93] 83  (08/23 0806) Resp:  [11-26] 17  (08/23 0806) BP: (84-129)/(39-66) 98/41 mmHg (08/23 0806) SpO2:  [99 %-100 %] 100 % (08/23 0815) FiO2 (%):  [39.7 %-40.4 %] 40 % (08/23 0815) Weight:  [45.8 kg (100 lb 15.5 oz)] 45.8 kg (100 lb 15.5 oz) (08/23 0500)  Intake/Output Summary (Last 24 hours) at 04/03/12 0847 Last data filed at 04/03/12 0800  Gross per 24 hour  Intake 1775.5 ml  Output   3300 ml  Net -1524.5 ml    Physical Examination: Gen: chronically ill and critically ill appearing, intubated HEENT: NCAT, PERRL, EOMi, ETT in place PULM: Insp crackles in bases bilaterally but improved since 8/20 CVS rrr, no mgr,  AB: BS infrequent, soft, less tender on my exam today Ext: warm, no edema, no clubbing, no cyanosis Derm: no rash or skin breakdown per RN Neuro: RASS 0. CAM-ICU negative for delirium. Moves all 4s. Wants to extubated    Dg Chest [facility] 1 View  04/03/2012  *RADIOLOGY REPORT*  Clinical Data: Evaluate endotracheal tube.  Increased secretions.  PORTABLE CHEST - 1 VIEW  Comparison: 1 day prior  Findings: Underlying hyperinflation.  Endotracheal tube terminates approximate 1.5 cm above  the carina.  Nasogastric tube extends beyond the  inferior aspect of the film.  Right IJ central line terminates at the low SVC.  Underlying hyperinflation.  Patient rotated right.  Mild cardiomegaly.  Layering bilateral pleural effusions are similar. No pneumothorax.  Biapical pleural thickening.  Similar bibasilar airspace disease.  Increased density projecting over  the right upper lung.  IMPRESSION:  1.  Similar appearance of small bilateral pleural effusions and bibasilar airspace disease. 2.  Endotracheal tube borderline low in position.  Consider retraction 2 - 3 cm. 3.  Cannot exclude developing right upper lobe airspace disease. Recommend attention on follow-up.   Original Report Authenticated By: Consuello Bossier, M.D.    Dg Chest Port 1 View  04/02/2012  *RADIOLOGY REPORT*  Clinical Data: Check ET tube.  PORTABLE CHEST - 1 VIEW  Comparison: 04/01/2012  Findings: Support devices are in stable position.  Endotracheal tube tip is at the carina and could be retracted slightly. There is hyperinflation of the lungs compatible with COPD.  Bilateral lower lobe airspace opacities and layering effusions.  Cardiomegaly.  No real change since prior study.  IMPRESSION: No interval change.  Endotracheal tube is near the carina and could be retracted slightly for optimal positioning.   Original Report Authenticated By: Cyndie Chime, M.D.      Principal Problem:  *Septic shock Active Problems:  Acute respiratory failure with hypoxia  Acute cholangitis  Suicidal ideation  Chest pain syndrome  CAD (coronary artery disease)  COPD (chronic obstructive pulmonary disease)  Anxiety  CHF (congestive heart failure)  Vitamin D deficiency disease  UTI (lower urinary tract infection)  Hypotension  Fever   ASSESSMENT AND PLAN  PULMONARY  Lab 03/29/12 0339 03/28/12 0331  PHART 7.414 7.365  PCO2ART 32.4* 32.6*  PO2ART 95.5 142.0*  HCO3 20.3 18.7*  O2SAT 98.2 99.0   Ventilator Settings: Vent Mode:  [-] CPAP FiO2 (%):  [39.7 %-40.4 %] 40 % Set Rate:  [14 bmp] 14 bmp Vt Set:  [0 mL-450 mL] 450 mL PEEP:  [5 cmH20] 5 cmH20 Pressure Support:  [5 cmH20-15 cmH20] 5 cmH20 Plateau Pressure:  [13 cmH20-19 cmH20] 15 cmH20 CXR:  Bilateral effusions and pulm edema unchanged 8/17 ETT:  8/15 >>  A:  Acute on chronic hypoxemic respiratory failure due to pulmonary edema,  effusions; COPD on 3 L O2 at home  - on 04/03/2012: meets extubation critieria. CXR still with effusions and congested  P:  Extubate 04/03/2012 Continue diuresis but lower dose  CARDIOVASCULAR  Lab 04/03/12 0505 04/01/12 0415 03/28/12 0415  TROPONINI -- -- --  LATICACIDVEN -- -- 1.7  PROBNP 1237.0* 2793.0* --   ECG:  Sinus tach, peaked T waves, no ST wave changes Lines:  03/26/12 R IJ CVL >> 03/25/12 2D TTE LVEF 60-65%, elevated PA pressure  A: Septic shock and currently volume overloaded based on physical exam and CXR; question mixed shock (cardiogenic?) given exam findings; CVP 8/17 am = 8-9. S/p EGDT CAD but ruled out for MI, 03/25/12 TTE normal Lactate 2.5 >> 2.5 on 8/16   - on 04/03/2012 and since 8/19 atleast: normal BP. BNP improving with diuresis   P:  -monitor -see ID section - continue diuresis since  04/01/12 but lower dose on 8.23/13   RENAL  Lab 04/03/12 0500 04/02/12 0414 04/01/12 0415 03/31/12 0419 03/30/12 0424  NA 139 145 148* 149* 141  K 3.7 3.9 -- -- --  CL 100 105 110 116* 109  CO2 35* 37*  32 27 25  BUN 17 15 18  26* 36*  CREATININE 0.45* 0.50 0.43* 0.44* 0.55  CALCIUM 8.5 8.5 8.3* 9.2 9.0  MG -- 2.3 1.9 2.1 --  PHOS -- 2.6 2.9 1.2* --   Intake/Output      08/22 0701 - 08/23 0700 08/23 0701 - 08/24 0700   I.V. (mL/kg) 480 (10.5) 20 (0.4)   NG/GT 1170 45   IV Piggyback 158    Total Intake(mL/kg) 1808 (39.5) 65 (1.4)   Urine (mL/kg/hr) 3550 (3.2)    Drains 650    Stool 0    Total Output 4200    Net -2392 +65        Stool Occurrence 2 x     Foley:  03/25/12  A:  Oliguria, and Hyponatremia all resolved 8/19  - on 04/03/2012: Normal lytes P:   -change to saline lock 04/03/12 - monitor bmp  GASTROINTESTINAL  Lab 04/02/12 0414 03/31/12 0419 03/28/12 0415  AST 18 18 66*  ALT 29 49* 119*  ALKPHOS 98 140* 170*  BILITOT 1.1 2.0* 4.5*  PROT 4.7* 4.7* 4.7*  ALBUMIN 2.1* 2.1* 2.1*    A:  Elevated transaminases and alk phos, improving,due  to cholangitis (hx chole), CBD obstruction due to Primary Gall stone. Anatomy complicated by huge hiatal hernia. HX of multiple lap  In past   -8/16 external biliary drain - 817  Start tube feeds  - on 03/31/12:  IR procedure - gall stones blocking CBD. No evidence of malignancy. S/p internal external biliary drain.  - 8/22 - GI and CCS opinion: procedure to remove primary bile duct not technically feasible. So, IR plans sphincterotomy through int-external biliary drain in 2 weeks from 04/02/12  P:   - IR guided ampullary spincterotomy in 2 weeks from 04/02/12  HEMATOLOGIC  Lab 04/02/12 0414 04/01/12 0415 03/31/12 0419 03/30/12 0424 03/29/12 0345 03/28/12 2100 03/28/12 0415  HGB 8.6* 9.2* 10.4* 10.0* 8.9* -- --  HCT 26.0* 27.4* 30.4* 29.3* 26.2* -- --  PLT 144* 96* 68* 45* 23* -- --  INR -- -- -- -- -- 1.07 1.12  APTT -- -- -- -- -- 30 --   A:  Thrombocytopenia - likely sepsis related (not on heparin, not in DIC)       Anemia of critical illness  - on 04/03/2012: relative stability   P:   - PRBC only for Hgb < 7gm% unless actively bleeding - monitor platelent and bleeding  INFECTIOUS  Lab 04/02/12 0414 04/01/12 0415 03/31/12 0419 03/30/12 0424 03/29/12 0345  WBC 16.0* 19.1* 22.3* 19.0* 14.3*  PROCALCITON 1.10 -- -- -- --   Cultures: 03/25/12 blood >> ecoli +  ENterococcus 03/25/12 urine >> COAG NEG STAPH  8/16 Biliary drain >> E colii and Enterococcus +  Results for orders placed during the hospital encounter of 03/24/12  URINE CULTURE     Status: Normal   Collection Time   03/25/12  3:28 PM      Component Value Range Status Comment   Specimen Description URINE, RANDOM   Final    Special Requests NONE   Final    Culture  Setup Time 03/25/2012 15:54   Final    Colony Count 65,000 COLONIES/ML   Final    Culture     Final    Value: STAPHYLOCOCCUS SPECIES (COAGULASE NEGATIVE)     Note: RIFAMPIN AND GENTAMICIN SHOULD NOT BE USED AS SINGLE DRUGS FOR TREATMENT OF STAPH  INFECTIONS.   Report Status 03/28/2012 FINAL  Final    Organism ID, Bacteria STAPHYLOCOCCUS SPECIES (COAGULASE NEGATIVE)   Final   CULTURE, BLOOD (ROUTINE X 2)     Status: Normal   Collection Time   03/25/12  3:55 PM      Component Value Range Status Comment   Specimen Description BLOOD LEFT HAND   Final    Special Requests BOTTLES DRAWN AEROBIC ONLY 5CC   Final    Culture  Setup Time 03/25/2012 20:33   Final    Culture     Final    Value: ESCHERICHIA COLI     ENTEROCOCCUS GALLINARUM     Note: Gram Stain Report Called to,Read Back By and Verified With: CARRIE H @1021  03/26/12 BY KRAWS   Report Status 03/31/2012 FINAL   Final    Organism ID, Bacteria ESCHERICHIA COLI   Final    Organism ID, Bacteria ENTEROCOCCUS GALLINARUM   Final   CULTURE, BLOOD (ROUTINE X 2)     Status: Normal   Collection Time   03/25/12  4:00 PM      Component Value Range Status Comment   Specimen Description BLOOD LEFT ARM   Final    Special Requests BOTTLES DRAWN AEROBIC ONLY 5CC   Final    Culture  Setup Time 03/25/2012 20:33   Final    Culture     Final    Value: ESCHERICHIA COLI     Note: SUSCEPTIBILITIES PERFORMED ON PREVIOUS CULTURE WITHIN THE LAST 5 DAYS.     ENTEROCOCCUS GALLINARUM     Note: Gram Stain Report Called to,Read Back By and Verified With: CARRIE H @1021  03/26/12 BY KRAWS   Report Status 04/01/2012 FINAL   Final   MRSA PCR SCREENING     Status: Normal   Collection Time   03/25/12  9:10 PM      Component Value Range Status Comment   MRSA by PCR NEGATIVE  NEGATIVE Final   ANAEROBIC CULTURE     Status: Normal   Collection Time   03/27/12 12:55 PM      Component Value Range Status Comment   Specimen Description FLUID BILE   Final    Special Requests NONE   Final    Gram Stain     Final    Value: NO WBC SEEN     NO ORGANISMS SEEN   Culture NO ANAEROBES ISOLATED   Final    Report Status 04/01/2012 FINAL   Final   BODY FLUID CULTURE     Status: Normal   Collection Time   03/27/12 12:55 PM       Component Value Range Status Comment   Specimen Description FLUID BILE   Final    Special Requests NONE   Final    Gram Stain     Final    Value: NO WBC SEEN     NO ORGANISMS SEEN   Culture     Final    Value: ABUNDANT ESCHERICHIA COLI     MODERATE ENTEROCOCCUS GALLINARUM     Note: CRITICAL RESULT CALLED TO, READ BACK BY AND VERIFIED WITH: CARRIE H @1259  03/31/12 BY KRAWS   Report Status 03/31/2012 FINAL   Final    Organism ID, Bacteria ESCHERICHIA COLI   Final    Organism ID, Bacteria ENTEROCOCCUS GALLINARUM   Final   VRE CULTURE     Status: Normal (Preliminary result)   Collection Time   03/29/12  5:16 AM      Component Value Range Status Comment  Specimen Description STOOL   Final    Special Requests NONE   Final    Culture Culture reincubated for better growth   Final    Report Status PENDING   Incomplete      Antibiotics: 8/14 zosyn (UTI, Cholangitis)>>8/17, 8/20 >> 8/14 vanc (GPC's in blood) >> 820 8/17 ceftaz (ecoli bacteremia) >>8/20 8/17 flagyl (cholangitis, bacteremia)>>8/20  Anti-infectives     Start     Dose/Rate Route Frequency Ordered Stop   04/01/12 0100   vancomycin (VANCOCIN) 1,250 mg in sodium chloride 0.9 % 250 mL IVPB  Status:  Discontinued        1,250 mg 166.7 mL/hr over 90 Minutes Intravenous Every 24 hours 03/31/12 0006 03/31/12 1604   03/31/12 1630  piperacillin-tazobactam (ZOSYN) IVPB 3.375 g       3.375 g 12.5 mL/hr over 240 Minutes Intravenous Every 8 hours 03/31/12 1600     03/31/12 0100   vancomycin (VANCOCIN) 250 mg in sodium chloride 0.9 % 100 mL IVPB        250 mg 100 mL/hr over 60 Minutes Intravenous  Once 03/31/12 0028 03/31/12 0227   03/28/12 2200   cefTAZidime (FORTAZ) injection 2 g  Status:  Discontinued        2 g Intramuscular Every 12 hours 03/28/12 1613 03/28/12 1619   03/28/12 1700   metroNIDAZOLE (FLAGYL) IVPB 500 mg  Status:  Discontinued        500 mg 100 mL/hr over 60 Minutes Intravenous Every 8 hours 03/28/12 1613  03/31/12 1604   03/28/12 1700   cefTAZidime (FORTAZ) 2 g in dextrose 5 % 50 mL IVPB  Status:  Discontinued        2 g 100 mL/hr over 30 Minutes Intravenous Every 12 hours 03/28/12 1619 03/31/12 1604   03/27/12 2300   vancomycin (VANCOCIN) IVPB 1000 mg/200 mL premix  Status:  Discontinued        1,000 mg 200 mL/hr over 60 Minutes Intravenous Every 24 hours 03/27/12 1135 03/31/12 0004   03/26/12 0600   piperacillin-tazobactam (ZOSYN) IVPB 3.375 g  Status:  Discontinued        3.375 g 12.5 mL/hr over 240 Minutes Intravenous 3 times per day 03/26/12 0116 03/28/12 1618   03/26/12 0130  piperacillin-tazobactam (ZOSYN) IVPB 3.375 g       3.375 g 100 mL/hr over 30 Minutes Intravenous  Once 03/26/12 0109 03/26/12 0402   03/26/12 0115   piperacillin-tazobactam (ZOSYN) IVPB 3.375 g  Status:  Discontinued        3.375 g 12.5 mL/hr over 240 Minutes Intravenous 3 times per day 03/26/12 0115 03/26/12 0116   03/25/12 2300   vancomycin (VANCOCIN) 750 mg in sodium chloride 0.9 % 150 mL IVPB  Status:  Discontinued        750 mg 150 mL/hr over 60 Minutes Intravenous Every 24 hours 03/25/12 2240 03/27/12 1135   03/25/12 2300   ceFEPIme (MAXIPIME) 1 g in dextrose 5 % 50 mL IVPB  Status:  Discontinued        1 g 100 mL/hr over 30 Minutes Intravenous Every 24 hours 03/25/12 2240 03/26/12 0108   03/25/12 1630   cefTRIAXone (ROCEPHIN) 1 g in dextrose 5 % 50 mL IVPB  Status:  Discontinued        1 g 100 mL/hr over 30 Minutes Intravenous Every 24 hours 03/25/12 1619 03/25/12 2225           A:  Septic shock, ddx UTI, cholangitis with  classic gall stone organism due to primary gall stones. S/p egdt and resolved shock -  PCT 04/02/12 still high 1.1  P:   -cont zosyn; check PCT 04/05/12 to address antibiotic duration ---monitor biliary drain output    ENDOCRINE  Lab 04/03/12 0814 04/03/12 0409 04/03/12 0033 04/02/12 2044 04/02/12 1611  GLUCAP 128* 111* 120* 113* 128*   A:  Hyperglycemia  P:     -ICU hyperglycemia protocol  NEUROLOGIC  A:  Suicidality on 8/14 -? Sepsis related  - on 03/30/12 abnd 04/03/12: Normal WUA  P:    - change to prn sedation starting 03/30/12; avoid benzo -psych to follow up after critical illness /extubation  BEST PRACTICE / DISPOSITION Level of Care:  ICU Primary Service:  PCCM Consultants:  GI, IR, CCS. - on 8/22//13 - discussed extensively on phone with Dr Madilyn Fireman GI, Dr Janee Morn CCS and Dr Denny Levy IR Code Status:  full Diet:  npo DVT Px:  lovenox off GI Px:  ppi Skin Integrity:  normal Social / Family:  Updated son and daughter at bedside 8/15. None at bedside 8/19 and 03/31/12. Son DPOA updated over phone 04/01/12 and at bedside 8.22.13    The patient is critically ill with multiple organ systems failure and requires high complexity decision making for assessment and support, frequent evaluation and titration of therapies, application of advanced monitoring technologies and extensive interpretation of multiple databases.   Critical Care Time devoted to patient care services described in this note is 31 Minutes.  Dr. Kalman Shan, M.D., [facility].C.P Pulmonary and Critical Care Medicine Staff Physician [facility] Pager: [phone], If no answer or between  15:00h - 7:00h: call [phone]  04/03/2012 8:47 AM

## 2012-04-03 NOTE — Progress Notes (Signed)
Subjective: Alert on vent  Objective: Vital signs in last 24 hours: Temp:  [98 F (36.7 C)-98.8 F (37.1 C)] 98.4 F (36.9 C) (08/23 0752) Pulse Rate:  [70-93] 83  (08/23 0806) Resp:  [11-26] 17  (08/23 0806) BP: (84-129)/(39-66) 98/41 mmHg (08/23 0806) SpO2:  [99 %-100 %] 100 % (08/23 0815) FiO2 (%):  [39.7 %-40.4 %] 40 % (08/23 0815) Weight:  [45.8 kg (100 lb 15.5 oz)] 45.8 kg (100 lb 15.5 oz) (08/23 0500) Last BM Date: 04/01/12  Intake/Output from previous day: 08/22 0701 - 08/23 0700 In: 1808 [I.V.:480; NG/GT:1170; IV Piggyback:158] Out: 4200 [Urine:3550; Drains:650] Intake/Output this shift: Total I/O In: 65 [I.V.:20; NG/GT:45] Out: -   General appearance: alert and cooperative Resp: clear to auscultation bilaterally Cardio: regular rate and rhythm GI: soft, no significant tenderness Drain with bilious output  Lab Results:   Basename 04/02/12 0414 04/01/12 0415  WBC 16.0* 19.1*  HGB 8.6* 9.2*  HCT 26.0* 27.4*  PLT 144* 96*   BMET  Basename 04/03/12 0500 04/02/12 0414  NA 139 145  K 3.7 3.9  CL 100 105  CO2 35* 37*  GLUCOSE 132* 110*  BUN 17 15  CREATININE 0.45* 0.50  CALCIUM 8.5 8.5   PT/INR No results found for this basename: LABPROT:2,INR:2 in the last 72 hours ABG No results found for this basename: PHART:2,PCO2:2,PO2:2,HCO3:2 in the last 72 hours  Studies/Results: Dg Chest Port 1 View  04/03/2012  *RADIOLOGY REPORT*  Clinical Data: Evaluate endotracheal tube.  Increased secretions.  PORTABLE CHEST - 1 VIEW  Comparison: 1 day prior  Findings: Underlying hyperinflation.  Endotracheal tube terminates approximate 1.5 cm above the carina.  Nasogastric tube extends beyond the  inferior aspect of the film.  Right IJ central line terminates at the low SVC.  Underlying hyperinflation.  Patient rotated right.  Mild cardiomegaly.  Layering bilateral pleural effusions are similar. No pneumothorax.  Biapical pleural thickening.  Similar bibasilar airspace  disease.  Increased density projecting over the right upper lung.  IMPRESSION:  1.  Similar appearance of small bilateral pleural effusions and bibasilar airspace disease. 2.  Endotracheal tube borderline low in position.  Consider retraction 2 - 3 cm. 3.  Cannot exclude developing right upper lobe airspace disease. Recommend attention on follow-up.   Original Report Authenticated By: Consuello Bossier, M.D.    Dg Chest Port 1 View  04/02/2012  *RADIOLOGY REPORT*  Clinical Data: Check ET tube.  PORTABLE CHEST - 1 VIEW  Comparison: 04/01/2012  Findings: Support devices are in stable position.  Endotracheal tube tip is at the carina and could be retracted slightly. There is hyperinflation of the lungs compatible with COPD.  Bilateral lower lobe airspace opacities and layering effusions.  Cardiomegaly.  No real change since prior study.  IMPRESSION: No interval change.  Endotracheal tube is near the carina and could be retracted slightly for optimal positioning.   Original Report Authenticated By: Cyndie Chime, M.D.     Anti-infectives: Anti-infectives     Start     Dose/Rate Route Frequency Ordered Stop   04/01/12 0100   vancomycin (VANCOCIN) 1,250 mg in sodium chloride 0.9 % 250 mL IVPB  Status:  Discontinued        1,250 mg 166.7 mL/hr over 90 Minutes Intravenous Every 24 hours 03/31/12 0006 03/31/12 1604   03/31/12 1630   piperacillin-tazobactam (ZOSYN) IVPB 3.375 g        3.375 g 12.5 mL/hr over 240 Minutes Intravenous Every 8 hours 03/31/12  1600     03/31/12 0100   vancomycin (VANCOCIN) 250 mg in sodium chloride 0.9 % 100 mL IVPB        250 mg 100 mL/hr over 60 Minutes Intravenous  Once 03/31/12 0028 03/31/12 0227   03/28/12 2200   cefTAZidime (FORTAZ) injection 2 g  Status:  Discontinued        2 g Intramuscular Every 12 hours 03/28/12 1613 03/28/12 1619   03/28/12 1700   metroNIDAZOLE (FLAGYL) IVPB 500 mg  Status:  Discontinued        500 mg 100 mL/hr over 60 Minutes Intravenous Every  8 hours 03/28/12 1613 03/31/12 1604   03/28/12 1700   cefTAZidime (FORTAZ) 2 g in dextrose 5 % 50 mL IVPB  Status:  Discontinued        2 g 100 mL/hr over 30 Minutes Intravenous Every 12 hours 03/28/12 1619 03/31/12 1604   03/27/12 2300   vancomycin (VANCOCIN) IVPB 1000 mg/200 mL premix  Status:  Discontinued        1,000 mg 200 mL/hr over 60 Minutes Intravenous Every 24 hours 03/27/12 1135 03/31/12 0004   03/26/12 0600   piperacillin-tazobactam (ZOSYN) IVPB 3.375 g  Status:  Discontinued        3.375 g 12.5 mL/hr over 240 Minutes Intravenous 3 times per day 03/26/12 0116 03/28/12 1618   03/26/12 0130   piperacillin-tazobactam (ZOSYN) IVPB 3.375 g        3.375 g 100 mL/hr over 30 Minutes Intravenous  Once 03/26/12 0109 03/26/12 0402   03/26/12 0115   piperacillin-tazobactam (ZOSYN) IVPB 3.375 g  Status:  Discontinued        3.375 g 12.5 mL/hr over 240 Minutes Intravenous 3 times per day 03/26/12 0115 03/26/12 0116   03/25/12 2300   vancomycin (VANCOCIN) 750 mg in sodium chloride 0.9 % 150 mL IVPB  Status:  Discontinued        750 mg 150 mL/hr over 60 Minutes Intravenous Every 24 hours 03/25/12 2240 03/27/12 1135   03/25/12 2300   ceFEPIme (MAXIPIME) 1 g in dextrose 5 % 50 mL IVPB  Status:  Discontinued        1 g 100 mL/hr over 30 Minutes Intravenous Every 24 hours 03/25/12 2240 03/26/12 0108   03/25/12 1630   cefTRIAXone (ROCEPHIN) 1 g in dextrose 5 % 50 mL IVPB  Status:  Discontinued        1 g 100 mL/hr over 30 Minutes Intravenous Every 24 hours 03/25/12 1619 03/25/12 2225          Assessment/Plan: s/p * No surgery found * Choledocholithiasis and large HH - anatomy precludes ERCP and would make surgery quite dangerous.  Drain in place per IR with noted further plan for IR intervention in the future to try to clear stones in CBD. That may not happen in the acute setting and she is temporized with the drain. Weaning per CCM.  We are available if plans change during this  admission.  LOS: 10 days    Laurie Sparks E 04/03/2012

## 2012-04-03 NOTE — Progress Notes (Signed)
  Subjective: PTC/bili drain placed 8/16 + stones; not malignancy Output great Pt feels so much better Plan for stone extraction as OP in 2 weeks or more  Objective: Vital signs in last 24 hours: Temp:  [98 F (36.7 C)-98.8 F (37.1 C)] 98.4 F (36.9 C) (08/23 0752) Pulse Rate:  [70-93] 79  (08/23 1000) Resp:  [11-26] 18  (08/23 1000) BP: (84-129)/(39-72) 109/72 mmHg (08/23 1000) SpO2:  [99 %-100 %] 100 % (08/23 1000) FiO2 (%):  [39.7 %-40.2 %] 40 % (08/23 0900) Weight:  [100 lb 15.5 oz (45.8 kg)] 100 lb 15.5 oz (45.8 kg) (08/23 0500) Last BM Date: 04/01/12  Intake/Output from previous day: 08/22 0701 - 08/23 0700 In: 1808 [I.V.:480; NG/GT:1170; IV Piggyback:158] Out: 4200 [Urine:3550; Drains:650] Intake/Output this shift: Total I/O In: 190 [I.V.:20; NG/GT:120; IV Piggyback:50] Out: 550 [Urine:550]  PE:  Site of bili drain intact Clean and dry; nt Output 650 cc yesterday; 200 cc in bag now T Billi 0.5 Afeb; vss  Lab Results:   [facility] 04/02/12 0414 04/01/12 0415  WBC 16.0* 19.1*  HGB 8.6* 9.2*  HCT 26.0* 27.4*  PLT 144* 96*   BMET  Basename 04/03/12 0500 04/02/12 0414  NA 139 145  K 3.7 3.9  CL 100 105  CO2 35* 37*  GLUCOSE 132* 110*  BUN 17 15  CREATININE 0.45* 0.50  CALCIUM 8.5 8.5   PT/INR No results found for this basename: LABPROT:2,INR:2 in the last 72 hours ABG No results found for this basename: PHART:2,PCO2:2,PO2:2,HCO3:2 in the last 72 hours  Studies/Results: Dg Chest Port 1 View  04/03/2012  *RADIOLOGY REPORT*  Clinical Data: Evaluate endotracheal tube.  Increased secretions.  PORTABLE CHEST - 1 VIEW  Comparison: 1 day prior  Findings: Underlying hyperinflation.  Endotracheal tube terminates approximate 1.5 cm above the carina.  Nasogastric tube extends beyond the  inferior aspect of the film.  Right IJ central line terminates at the low SVC.  Underlying hyperinflation.  Patient rotated right.  Mild cardiomegaly.  Layering bilateral pleural  effusions are similar. No pneumothorax.  Biapical pleural thickening.  Similar bibasilar airspace disease.  Increased density projecting over the right upper lung.  IMPRESSION:  1.  Similar appearance of small bilateral pleural effusions and bibasilar airspace disease. 2.  Endotracheal tube borderline low in position.  Consider retraction 2 - 3 cm. 3.  Cannot exclude developing right upper lobe airspace disease. Recommend attention on follow-up.   Original Report Authenticated By: Consuello Bossier, M.D.    Dg Chest Port 1 View  04/02/2012  *RADIOLOGY REPORT*  Clinical Data: Check ET tube.  PORTABLE CHEST - 1 VIEW  Comparison: 04/01/2012  Findings: Support devices are in stable position.  Endotracheal tube tip is at the carina and could be retracted slightly. There is hyperinflation of the lungs compatible with COPD.  Bilateral lower lobe airspace opacities and layering effusions.  Cardiomegaly.  No real change since prior study.  IMPRESSION: No interval change.  Endotracheal tube is near the carina and could be retracted slightly for optimal positioning.   Original Report Authenticated By: Cyndie Chime, M.D.     Anti-infectives:   Assessment/Plan: s/p PTC/ bili drain placed 8/16 Plan for stone extraction as OP Need pt stable and labs wnl Plan 2 weeks or more in IR  pts son aware.Ralene Muskrat A 04/03/2012

## 2012-04-03 NOTE — Procedures (Signed)
Extubation Procedure Note  Patient Details:   Name: Milina Pagett DOB: 05/16/1941 MRN: [id number]   Airway Documentation:     Evaluation  O2 sats: stable throughout and currently acceptable Complications: No apparent complications Patient did tolerate procedure well. Bilateral Breath Sounds: Clear Suctioning: Airway Yes Pt awake and alert, extubated per MD order. Placed on 3 L [place], sat 100%. Positive cuff leak, BBS cl. Pt able to vocalize.  Arloa Koh 04/03/2012, 9:34 AM l

## 2012-04-04 LAB — VRE CULTURE: Culture: NEGATIVE

## 2012-04-04 LAB — BASIC METABOLIC PANEL
BUN: 16 mg/dL (ref 6–23)
Calcium: 8.9 mg/dL (ref 8.4–10.5)
GFR calc Af Amer: >90 mL/min (ref >90)
GFR calc non Af Amer: >90 mL/min (ref >90)
Glucose, Bld: 83 mg/dL (ref 70–99)

## 2012-04-04 LAB — GLUCOSE, CAPILLARY
Glucose-Capillary: 79 mg/dL (ref 70–99)
Glucose-Capillary: 108 mg/dL — ABNORMAL HIGH (ref 70–99)

## 2012-04-04 LAB — CBC
HCT: 27.5 % — ABNORMAL LOW (ref 36.0–46.0)
Hemoglobin: 8.9 g/dL — ABNORMAL LOW (ref 12.0–15.0)
MCV: 97.2 fL (ref 78.0–100.0)
RBC: 2.83 MIL/uL — ABNORMAL LOW (ref 3.87–5.11)
WBC: 13.9 10*3/uL — ABNORMAL HIGH (ref 4.0–10.5)

## 2012-04-04 LAB — PRO B NATRIURETIC PEPTIDE: Pro B Natriuretic peptide (BNP): 1006 pg/mL — ABNORMAL HIGH (ref 0–125)

## 2012-04-04 MED ORDER — HYDROCODONE-ACETAMINOPHEN 10-325 MG PO TABS
2.0000 | ORAL_TABLET | ORAL | Status: DC | PRN
  Administered 2012-04-04 – 2012-04-06 (×11): 2 via ORAL
  Filled 2012-04-04 (×11): qty 2

## 2012-04-04 MED ORDER — IPRATROPIUM BROMIDE 0.02 % IN SOLN
0.5000 mg | Freq: Four times a day (QID) | RESPIRATORY_TRACT | Status: DC
  Administered 2012-04-04 – 2012-04-07 (×11): 0.5 mg via RESPIRATORY_TRACT
  Filled 2012-04-04 (×11): qty 2.5

## 2012-04-04 MED ORDER — ALBUTEROL SULFATE (5 MG/ML) 0.5% IN NEBU
2.5000 mg | INHALATION_SOLUTION | Freq: Four times a day (QID) | RESPIRATORY_TRACT | Status: DC
  Administered 2012-04-04 – 2012-04-07 (×11): 2.5 mg via RESPIRATORY_TRACT
  Filled 2012-04-04 (×11): qty 0.5

## 2012-04-04 MED ORDER — MORPHINE SULFATE 2 MG/ML IJ SOLN
2.0000 mg | INTRAMUSCULAR | Status: DC | PRN
  Administered 2012-04-04 – 2012-04-08 (×17): 2 mg via INTRAVENOUS
  Filled 2012-04-04 (×18): qty 1

## 2012-04-04 MED ORDER — ALPRAZOLAM ER 1 MG PO TB24: 1.0000 mg | ORAL_TABLET | Freq: Four times a day (QID) | ORAL | Status: DC | PRN

## 2012-04-04 MED ORDER — ALUM & MAG HYDROXIDE-SIMETH 200-200-20 MG/5ML PO SUSP: 15.0000 mL | ORAL | Status: DC | PRN

## 2012-04-04 NOTE — Progress Notes (Signed)
Name: Laurie Sparks MRN: [id number] DOB: 1940-11-15    LOS: 11 days  Referring Provider:  Maryan Puls Reason for Referral:  Shock, hypoxemia  PULMONARY / CRITICAL CARE MEDICINE   Brief patient description:  71 y/o female with prior cholecystectomy, CHF and CAD admitted 8/14 with chest pain developed hypotension, tachycardia and worsening hypoxemic respiratory failure likely in the setting of sepsis likely due to cholangitis vs. Less likely SBO/abdominal process. Final dx: Primary CBD stones with obstructive jaundice, cholangitis, septic shock  Events Since Admission: 8/14 - ECHO ef 65%. PASP 53 8/15 >> s/p CT scan abd, CBD obstruction with probable mass 8/16 >> IR guided biliary (CBD) drain 8/17: Phenylephrine off, opens eyes to voice. Gi signed off 03/30/2012: Off pressors. Still on sedation gtt. Normal WUA. Starting SBT.  03/31/12: IR procedure - dx is gall stones. CCS consult - recommending GI guided ERCP sphinctertom +/- stent as first step and if fails/not possible IR guided approach as next step (CCS feels complicated anantomy for surgical CBD exploration due to anatomy and morbidity)  IR: Successful placement of an internal-external biliary drain.  The cause of the obstructive jaundice is due to biliary stones. There was a very large stone in the distal common bile duct which was mobile throughout the procedure.  Plan:  Plan internal-external biliary drainage until the patient has fully recovered from sepsis and respiratory distress.  Will need GI's opinion about whether ERCP and stone extraction are possible due to the patient's abnormal anatomy.  Otherwise, the patient may be a candidate for percutaneous sphincteroplasty and stone removal.   Original Report Authenticated By: Richarda Overlie, M.D.    8/21 - GI feels ERCP sphincterotomy too anatomically difficult because majority of stomach in chest (herniation and stone too big). REcommending IR ampullary sphincterotomy via IR  8/22 - IR: Allow  pt to improve and stabilize clinically and plan for elective attempt at balloon sphincteroplasty and stone removal in about 2 weeks. FAiled PSV  Current Status:   8/24 : pt extubated 8/23.  On RA this am. Ready for floor tfr.  Vital Signs: Temp:  [98 F (36.7 C)-98.8 F (37.1 C)] 98 F (36.7 C) (08/24 0810) Pulse Rate:  [73-99] 98  (08/24 1000) Resp:  [17-28] 21  (08/24 1000) BP: (102-134)/(42-77) 112/56 mmHg (08/24 1000) SpO2:  [93 %-100 %] 94 % (08/24 1000) Weight:  [43 kg (94 lb 12.8 oz)] 43 kg (94 lb 12.8 oz) (08/24 0500)  Intake/Output Summary (Last 24 hours) at 04/04/12 1103 Last data filed at 04/04/12 1000  Gross per 24 hour  Intake    420 ml  Output   1295 ml  Net   -875 ml    Physical Examination: Gen: chronically ill and critically ill appearing, on RA HEENT: NCAT, PERRL, EOMi,  PULM: clear CVS rrr, no mgr,  AB: BS infrequent, soft, less tender  Ext: warm, no edema, no clubbing, no cyanosis Derm: no rash or skin breakdown per RN Neuro: awake and alert     Principal Problem:  *Septic shock Active Problems:  Chest pain syndrome  CAD (coronary artery disease)  COPD (chronic obstructive pulmonary disease)  Anxiety  CHF (congestive heart failure)  Vitamin D deficiency disease  Suicidal ideation  UTI (lower urinary tract infection)  Hypotension  Fever  Acute respiratory failure with hypoxia  Acute cholangitis   ASSESSMENT AND PLAN  PULMONARY  Lab 03/29/12 0339  PHART 7.414  PCO2ART 32.4*  PO2ART 95.5  HCO3 20.3  O2SAT 98.2  Ventilator Settings:   CXR: none ETT:  8/15 >>8/23  A:  Acute on chronic hypoxemic respiratory failure due to pulmonary edema, effusions; COPD on 3 L O2 at home  Resolved resp failure 8/23. Doing well 8/24 P:  Reduce lasix to off tfr to floor Leave off oxygen for now Cont BDs  CARDIOVASCULAR  Lab 04/04/12 0415 04/03/12 0505 04/01/12 0415  TROPONINI -- -- --  LATICACIDVEN -- -- --  PROBNP 1006.0* 1237.0*  2793.0*   ECG:  Sinus tach, peaked T waves, no ST wave changes Lines:  03/26/12 R IJ CVL >> 03/25/12 2D TTE LVEF 60-65%, elevated PA pressure  A: Septic shock  Resolved.    P:  -monitor -see ID section - hold lasix 8/24  RENAL  Lab 04/04/12 0415 04/03/12 0500 04/02/12 0414 04/01/12 0415 03/31/12 0419  NA 138 139 145 148* 149*  K 4.0 3.7 -- -- --  CL 99 100 105 110 116*  CO2 34* 35* 37* 32 27  BUN 16 17 15 18  26*  CREATININE 0.39* 0.45* 0.50 0.43* 0.44*  CALCIUM 8.9 8.5 8.5 8.3* 9.2  MG 2.1 -- 2.3 1.9 2.1  PHOS 2.9 -- 2.6 2.9 1.2*   Intake/Output      08/23 0701 - 08/24 0700 08/24 0701 - 08/25 0700   P.O. 120 120   I.V. (mL/kg) 50 (1.2)    NG/GT 120    IV Piggyback 150 50   Total Intake(mL/kg) 440 (10.2) 170 (4)   Urine (mL/kg/hr) 1395 (1.4) 100 (0.6)   Drains 400    Total Output 1795 100   Net -1355 +70         Foley:  03/25/12  A:  Oliguria, and Hyponatremia all resolved 8/19 P:   -change to saline lock 04/03/12 - monitor bmp  GASTROINTESTINAL  Lab 04/02/12 0414 03/31/12 0419  AST 18 18  ALT 29 49*  ALKPHOS 98 140*  BILITOT 1.1 2.0*  PROT 4.7* 4.7*  ALBUMIN 2.1* 2.1*    A:  Elevated transaminases and alk phos, improving,due to cholangitis (hx chole), CBD obstruction due to Primary Gall stone. Anatomy complicated by huge hiatal hernia. HX of multiple lap  In past   -8/16 external biliary drain - 817  Start tube feeds  - on 03/31/12:  IR procedure - gall stones blocking CBD. No evidence of malignancy. S/p internal external biliary drain.  - 8/22 - GI and CCS opinion: procedure to remove primary bile duct not technically feasible. So, IR plans sphincterotomy through int-external biliary drain in 2 weeks from 04/02/12  P:   - IR guided ampullary spincterotomy in 2 weeks from 04/02/12 -cont external drain for now  HEMATOLOGIC  Lab 04/04/12 0415 04/02/12 0414 04/01/12 0415 03/31/12 0419 03/30/12 0424 03/28/12 2100  HGB 8.9* 8.6* 9.2* 10.4* 10.0* --   HCT 27.5* 26.0* 27.4* 30.4* 29.3* --  PLT 241 144* 96* 68* 45* --  INR -- -- -- -- -- 1.07  APTT -- -- -- -- -- 30   A:  Thrombocytopenia - likely sepsis related (not on heparin, not in DIC)       Anemia of critical illness    P:   - PRBC only for Hgb < 7gm% unless actively bleeding - monitor platelent and bleeding  INFECTIOUS  Lab 04/04/12 0415 04/02/12 0414 04/01/12 0415 03/31/12 0419 03/30/12 0424  WBC 13.9* 16.0* 19.1* 22.3* 19.0*  PROCALCITON -- 1.10 -- -- --   Cultures: 03/25/12 blood >> ecoli +  ENterococcus  03/25/12 urine >> COAG NEG STAPH  8/16 Biliary drain >> E colii and Enterococcus +    Antibiotics: 8/14 zosyn (UTI, Cholangitis)>>8/17, 8/20 >> 8/14 vanc (GPC's in blood) >> 820 8/17 ceftaz (ecoli bacteremia) >>8/20 8/17 flagyl (cholangitis, bacteremia)>>8/20    A:  Septic shock, ddx UTI, cholangitis with classic gall stone organism due to primary gall stones. S/p egdt and resolved shock  P:   -cont zosyn;  ---monitor biliary drain output    ENDOCRINE  Lab 04/04/12 0809 04/04/12 0006 04/03/12 2025 04/03/12 1547 04/03/12 1151  GLUCAP 79 91 94 94 101*   A:  Hyperglycemia  P:   -change to standard SSI   NEUROLOGIC  A:  Suicidality on 8/14 -? Sepsis related  - on 03/30/12 abnd 04/03/12: Normal WUA  P:    - change to prn sedation starting 03/30/12; avoid benzo -psych to follow up after critical illness /extubation  BEST PRACTICE / DISPOSITION Level of Care:  ICU Primary Service:  PCCM Consultants:  GI, IR, CCS.  Code Status:  full Diet:  po DVT Px:  lovenox off GI Px:  ppi Skin Integrity:  normal Social / Family:  Son updated at bedside 8/24   The patient is critically ill with multiple organ systems failure and requires high complexity decision making for assessment and support, frequent evaluation and titration of therapies, application of advanced monitoring technologies and extensive interpretation of multiple databases.   Critical  Care Time devoted to patient care services described in this note is 30 Minutes.  Dorcas Carrow Beeper  [phone]  Cell  [phone]  If no response or cell goes to voicemail, call beeper [phone]  04/04/2012 11:03 AM

## 2012-04-04 NOTE — Progress Notes (Signed)
  Subjective: PTC/bili drain placed 8/16 Doing better daily!  Objective: Vital signs in last 24 hours: Temp:  [98 F (36.7 C)-98.8 F (37.1 C)] 98 F (36.7 C) (08/24 0810) Pulse Rate:  [73-99] 98  (08/24 1000) Resp:  [17-28] 21  (08/24 1000) BP: (102-134)/(42-77) 112/56 mmHg (08/24 1000) SpO2:  [93 %-100 %] 94 % (08/24 1000) Weight:  [94 lb 12.8 oz (43 kg)] 94 lb 12.8 oz (43 kg) (08/24 0500) Last BM Date: 04/01/12  Intake/Output from previous day: 08/23 0701 - 08/24 0700 In: 440 [P.O.:120; I.V.:50; NG/GT:120; IV Piggyback:150] Out: 1795 [Urine:1395; Drains:400] Intake/Output this shift: Total I/O In: 170 [P.O.:120; IV Piggyback:50] Out: 100 [Urine:100]  PE:  Afeb; vss 400 cc output yesterday: bile 200 in bag now Site clean and dry UP IN CHAIR  Lab Results:   Basename 04/04/12 0415 04/02/12 0414  WBC 13.9* 16.0*  HGB 8.9* 8.6*  HCT 27.5* 26.0*  PLT 241 144*   BMET  Basename 04/04/12 0415 04/03/12 0500  NA 138 139  K 4.0 3.7  CL 99 100  CO2 34* 35*  GLUCOSE 83 132*  BUN 16 17  CREATININE 0.39* 0.45*  CALCIUM 8.9 8.5   PT/INR No results found for this basename: LABPROT:2,INR:2 in the last 72 hours ABG No results found for this basename: PHART:2,PCO2:2,PO2:2,HCO3:2 in the last 72 hours  Studies/Results: Dg Chest Port 1 View  04/03/2012  *RADIOLOGY REPORT*  Clinical Data: Evaluate endotracheal tube.  Increased secretions.  PORTABLE CHEST - 1 VIEW  Comparison: 1 day prior  Findings: Underlying hyperinflation.  Endotracheal tube terminates approximate 1.5 cm above the carina.  Nasogastric tube extends beyond the  inferior aspect of the film.  Right IJ central line terminates at the low SVC.  Underlying hyperinflation.  Patient rotated right.  Mild cardiomegaly.  Layering bilateral pleural effusions are similar. No pneumothorax.  Biapical pleural thickening.  Similar bibasilar airspace disease.  Increased density projecting over the right upper lung.  IMPRESSION:   1.  Similar appearance of small bilateral pleural effusions and bibasilar airspace disease. 2.  Endotracheal tube borderline low in position.  Consider retraction 2 - 3 cm. 3.  Cannot exclude developing right upper lobe airspace disease. Recommend attention on follow-up.   Original Report Authenticated By: Consuello Bossier, M.D.     Anti-infectives:   Assessment/Plan: s/p better daily Bili drain intact clean and dry;Good output Up in chair Plan:  Stone retrieval as OP few weeks  Rawson Minix A 04/04/2012

## 2012-04-05 LAB — CBC
Hemoglobin: 9.5 g/dL — ABNORMAL LOW (ref 12.0–15.0)
MCH: 31.3 pg (ref 26.0–34.0)
MCV: 99 fL (ref 78.0–100.0)
RBC: 3.04 MIL/uL — ABNORMAL LOW (ref 3.87–5.11)

## 2012-04-05 LAB — BASIC METABOLIC PANEL
CO2: 33 mEq/L — ABNORMAL HIGH (ref 19–32)
Chloride: 100 mEq/L (ref 96–112)
GFR calc non Af Amer: >90 mL/min (ref >90)
Glucose, Bld: 105 mg/dL — ABNORMAL HIGH (ref 70–99)
Potassium: 4.4 mEq/L (ref 3.5–5.1)
Sodium: 139 mEq/L (ref 135–145)

## 2012-04-05 LAB — GLUCOSE, CAPILLARY
Glucose-Capillary: 122 mg/dL — ABNORMAL HIGH (ref 70–99)
Glucose-Capillary: 194 mg/dL — ABNORMAL HIGH (ref 70–99)

## 2012-04-05 NOTE — Progress Notes (Signed)
[facility] PROGRESS NOTE  Laurie Sparks WGN:[id number] DOB: October 04, 1940 DOA: 03/24/2012 PCP: Warrick Parisian, MD  Assessment/Plan: Principal Problem:  *Septic shock Active Problems:  Chest pain syndrome  CAD (coronary artery disease)  COPD (chronic obstructive pulmonary disease)  Anxiety  CHF (congestive heart failure)  Vitamin D deficiency disease  Suicidal ideation  UTI (lower urinary tract infection)  Hypotension  Fever  Acute respiratory failure with hypoxia  Acute cholangitis  Acute on chronic hypoxemic respiratory failure due to pulmonary edema, effusions; COPD on 3 L O2 at home  Resolved resp failure 8/23. On 2 lit nasal canula oxygen.  Septic Shock: resolved. Leukocytosis improving. currently on zosyn.   Oliguria and Hyponatremia : resolved.   Elevated transaminases and alk phos, improving,due to cholangitis (hx chole), CBD obstruction due to Primary Gall stone. Anatomy complicated by huge hiatal hernia. HX of multiple lap In past  -8/16 external biliary drain  - 817 Start tube feeds  - on 03/31/12: IR procedure - gall stones blocking CBD. No evidence of malignancy. S/p internal external biliary drain.  - 8/22 - GI and [facility] opinion: procedure to remove primary bile duct not technically feasible. So, IR plans sphincterotomy through int-external biliary drain in 2 weeks from 04/02/12        Brief narrative: 71 y/o female with prior cholecystectomy, CHF and CAD admitted 8/14 with chest pain developed hypotension, tachycardia and worsening hypoxemic respiratory failure likely in the setting of sepsis likely due to cholangitis vs. Less likely SBO/abdominal process. Final dx: Primary CBD stones with obstructive jaundice, cholangitis, septic shock   Consultants:  Surgery  PCCM  IR  GI  Procedures:    Antibiotics:ZOSYN  HPI/Subjective: Feeling much better wants to go home instead of SNF.  Objective: Filed Vitals:   04/05/12 0500 04/05/12 0922  04/05/12 1300 04/05/12 1413  BP: 109/63  105/66   Pulse: 80  81   Temp: 98.4 F (36.9 C)  98.4 F (36.9 C)   TempSrc: Oral     Resp: 18  20   Height:      Weight:      SpO2: 94% 95% 93% 94%    Intake/Output Summary (Last 24 hours) at 04/05/12 1855 Last data filed at 04/05/12 1703  Gross per 24 hour  Intake     55 ml  Output   1375 ml  Net  -1320 ml   Filed Weights   04/02/12 0500 04/03/12 0500 04/04/12 0500  Weight: 49.1 kg (108 lb 3.9 oz) 45.8 kg (100 lb 15.5 oz) 43 kg (94 lb 12.8 oz)    Exam:   General:  Alert afebrile comfortable  Cardiovascular: s1s2  Respiratory: ctab  Abdomen: soft NT ND BS+  Data Reviewed: Basic Metabolic Panel:  Lab 04/05/12 7846 04/04/12 0415 04/03/12 0500 04/02/12 0414 04/01/12 0415 03/31/12 0419  NA 139 138 139 145 148* --  K 4.4 4.0 3.7 3.9 3.2* --  CL 100 99 100 105 110 --  CO2 33* 34* 35* 37* 32 --  GLUCOSE 105* 83 132* 110* 130* --  BUN 16 16 17 15 18  --  CREATININE 0.49* 0.39* 0.45* 0.50 0.43* --  CALCIUM 9.1 8.9 8.5 8.5 8.3* --  MG -- 2.1 -- 2.3 1.9 2.1  PHOS -- 2.9 -- 2.6 2.9 1.2*   Liver Function Tests:  Lab 04/02/12 0414 03/31/12 0419  AST 18 18  ALT 29 49*  ALKPHOS 98 140*  BILITOT 1.1 2.0*  PROT 4.7* 4.7*  ALBUMIN 2.1* 2.1*  Lab 04/01/12 0415  LIPASE 139*  AMYLASE 66   No results found for this basename: AMMONIA:5 in the last 168 hours CBC:  Lab 04/05/12 0612 04/04/12 0415 04/02/12 0414 04/01/12 0415 03/31/12 0419 03/30/12 0424  WBC 11.9* 13.9* 16.0* 19.1* 22.3* --  NEUTROABS -- -- -- -- 18.1* 16.3*  HGB 9.5* 8.9* 8.6* 9.2* 10.4* --  HCT 30.1* 27.5* 26.0* 27.4* 30.4* --  MCV 99.0 97.2 95.9 92.3 92.1 --  PLT 291 241 144* 96* 68* --   Cardiac Enzymes: No results found for this basename: CKTOTAL:5,CKMB:5,CKMBINDEX:5,TROPONINI:5 in the last 168 hours BNP (last 3 results)  Basename 04/04/12 0415 04/03/12 0505 04/01/12 0415  PROBNP 1006.0* 1237.0* 2793.0*   CBG:  Lab 04/05/12 1710 04/05/12 1205  04/05/12 0800 04/04/12 2115 04/04/12 1708  GLUCAP 122* 123* 110* 108* 92    Recent Results (from the past 240 hour(s))  ANAEROBIC CULTURE     Status: Normal   Collection Time   03/27/12 12:55 PM      Component Value Range Status Comment   Specimen Description FLUID BILE   Final    Special Requests NONE   Final    Gram Stain     Final    Value: NO WBC SEEN     NO ORGANISMS SEEN   Culture NO ANAEROBES ISOLATED   Final    Report Status 04/01/2012 FINAL   Final   BODY FLUID CULTURE     Status: Normal   Collection Time   03/27/12 12:55 PM      Component Value Range Status Comment   Specimen Description FLUID BILE   Final    Special Requests NONE   Final    Gram Stain     Final    Value: NO WBC SEEN     NO ORGANISMS SEEN   Culture     Final    Value: ABUNDANT ESCHERICHIA COLI     MODERATE ENTEROCOCCUS GALLINARUM     Note: CRITICAL RESULT CALLED TO, READ BACK BY AND VERIFIED WITH: CARRIE H @1259  03/31/12 BY KRAWS   Report Status 03/31/2012 FINAL   Final    Organism ID, Bacteria ESCHERICHIA COLI   Final    Organism ID, Bacteria ENTEROCOCCUS GALLINARUM   Final   VRE CULTURE     Status: Normal   Collection Time   03/29/12  5:16 AM      Component Value Range Status Comment   Specimen Description STOOL   Final    Special Requests NONE   Final    Culture NEGATIVE FOR VANCOMYCIN RESISTANT ENTEROCOCCI   Final    Report Status 04/04/2012 FINAL   Final      Studies: Dg Chest 2 View  03/24/2012  *RADIOLOGY REPORT*  Clinical Data: Mid and left chest pain.  CHEST - 2 VIEW  Comparison: PA and lateral chest 07/26/2010.  Findings: Marked elevation of the left hemidiaphragm is again seen. The chest is hyperexpanded.  Blunting of the right costophrenic angle is unchanged.  Heart size is upper normal.  IMPRESSION: No acute finding.  Stable compared to prior exam.  Original Report Authenticated By: Bernadene Bell. Maricela Curet, M.D.   Ct Abdomen Pelvis W Contrast  03/26/2012  *RADIOLOGY REPORT*  Clinical  Data: Abdominal pain, septic shock, nausea/vomiting  CT ABDOMEN AND PELVIS WITH CONTRAST  Technique:  Multidetector CT imaging of the abdomen and pelvis was performed following the standard protocol during bolus administration of intravenous contrast.  Contrast: 80mL OMNIPAQUE IOHEXOL 300 MG/ML  SOLN  Comparison: None.  Findings: Very large hiatal hernia, incompletely visualized, containing stomach to the right of midline (series 2/image 7), multiple loops of opacified and nonopacified bowel, and pancreas centrally (series 2/image 6).  Liver is notable for moderate to severe central intrahepatic ductal dilatation.  Common bile duct is markedly dilated with a 1.5 x 1.0 cm abnormal soft tissue lesion centrally (series 2/image 22) and a 2.7 x 1.9 cm obstructing mass distally near the ampulla in the left abdomen (series 2/image 15).  Status post cholecystectomy.  Spleen and adrenal glands are unremarkable.  5 mm nonobstructing left upper pole calculus (series 2/image 11). Right kidney is unremarkable.  No hydronephrosis.  Enteric tube terminates in the proximal duodenum in the left abdomen (series 2/image 23).  Contrast passes into the colon, indicating that the large hernia is nonobstructive.  Colonic diverticulosis, without associated inflammatory changes.  Atherosclerotic calcifications of the abdominal aorta and branch vessels.  Small volume abdominopelvic ascites.  Status post hysterectomy.  No adnexal masses.  Bladder is notable for nondependent gas and an indwelling Foley catheter.  Prior ORIF of the proximal left femur.  Degenerative changes of the visualized thoracolumbar spine.  Mild superior endplate compression deformities at L2 and L5.  IMPRESSION:  Distorted anatomy with very large hiatal hernia containing stomach, multiple loops of bowel, and pancreas, incompletely visualized.  No evidence of bowel obstruction.  Marked dilatation of the common bile duct with at least two intraductal lesions, as described  above, including a 2.7 x 1.9 cm obstructing mass near the ampulla. Moderate to severe intrahepatic ductal dilatation.  Additional ancillary findings as above.  Original Report Authenticated By: Charline Bills, M.D.   Ir Cholan Exist Tube  03/31/2012  *RADIOLOGY REPORT*  Clinical history:71 year old with obstructive jaundice.  The patient currently has an external biliary drain.  PROCEDURE(S): CHOLANGIOGRAM THROUGH EXISTING TUBE; PLACEMENT OF INTERNAL EXTERNAL BILIARY DRAIN  Physician: Rachelle Hora. Henn, MD  Medications:Versed 2 mg IV  Fluoroscopy time: 5.5 minutes  Contrast:  25 ml Omnipaque-300  Procedure:Informed consent was obtained for a cholangiogram and internalization of biliary drain.  The patient was also consented for a possible brush biopsy.  The patient was placed supine on the interventional table.  The existing biliary drain was prepped and draped in a sterile fashion.  Maximal barrier sterile technique was utilized including caps, mask, sterile gowns, sterile gloves, sterile drape, hand hygiene and skin antiseptic.  Contrast was injected into the biliary drain.  The biliary catheter was cut and removed over a Bentson wire.  5-French Kumpe catheter was used to perform additional cholangiograms.  Catheter was also advanced into the duodenum.  A Bentson wire was placed in the small bowel and a 10-French internal-external biliary drain was placed over a Bentson wire.  The distal aspect of the drain was reconstituted in the small bowel.  Catheter sutured to the skin and attached to a gravity bag.  Findings:The cholangiogram demonstrated an external biliary drain. The patient's anatomy is very unusual in that the common bile duct extends cephalad and is left of midline.  Cholangiogram demonstrated a very large filling defect in the distal common bile duct consistent with a large stone.  There are additional filling defects throughout the biliary system suggestive for additional stones.  The ampulla is  widely patent and contrast is now spontaneously draining into the small bowel.  An underlying stricture or lesion was not identified.  Complications: None  Impression:  Successful placement of an internal-external biliary drain.  The cause of the obstructive jaundice is due to biliary stones. There was a very large stone in the distal common bile duct which was mobile throughout the procedure.  Plan:  Plan internal-external biliary drainage until the patient has fully recovered from sepsis and respiratory distress.  Will need GI's opinion about whether ERCP and stone extraction are possible due to the patient's abnormal anatomy.  Otherwise, the patient may be a candidate for percutaneous sphincteroplasty and stone removal.   Original Report Authenticated By: Richarda Overlie, M.D.    Ir Perc Biliary Drain-ext Only  03/27/2012  *RADIOLOGY REPORT*  IR PERCUTANEOUS TRANSHEPATIC CHOLANGIOGRAM AND PERCUTANEOUS BILIARY DRAIN PLACEMENT  Date: 03/27/2012  Clinical History: 71 year old female with severe sepsis in the setting of obstructed cholangitis. By CT scan, she has an obstructing mass of uncertain etiology in the distal common bile duct.  The left and right intrahepatic ductal system appeared to communicate to.  We will proceed with placement of a left-sided approach percutaneous biliary drain.  If necessary, bilateral biliary drains will be placed.  Procedures Performed: 1. Ultrasound-guided puncture of a peripheral left bile duct 2.  Percutaneous transhepatic cholangiogram 3.  Placement of a percutaneous transhepatic biliary drain  Interventional Radiologist:  Sterling Big, MD  Sedation: Moderate (conscious) sedation was used.  2 mg Versed, 50 mcg Fentanyl were administered intravenously.  The patient's vital signs were monitored continuously by radiology nursing throughout the procedure.  Sedation Time: 53 minutes  Fluoroscopy time: 23.4  Contrast volume: 60 ml Omnipaque-300 administered into the biliary system   PROCEDURE/FINDINGS:   Informed consent was obtained from the patient following explanation of the procedure, risks, benefits and alternatives. The patient understands, agrees and consents for the procedure. All questions were addressed. A time out was performed.  Maximal barrier sterile technique utilized including caps, mask, sterile gowns, sterile gloves, large sterile drape, hand hygiene, and betadine skin prep.  The mid epigastric region to the left of midline was interrogated with ultrasound.  Multiple dilated left sided intrahepatic bile duct were identified.  A peripheral (3rd order) bile duct was identified.  Local anesthesia was achieved with infiltration of 1% lidocaine.  Under direct sonographic guidance, the peripheral biliary radicle was punctured with a 21-gauge micropuncture needle. Images obtained stored for the medical record.  An 0.018-inch wire was advanced into the central bile ducts followed by the Accustick sheath.  Hand injection of contrast material through the sheath was performed to retain a percutaneous transhepatic cholangiogram.  The patient's anatomy is markedly distorted secondary to her large hiatal hernia and partial herniation of the stomach, small bowel and pancreas into the chest. Her common bile duct is markedly dilated  and ascends toward the left lower chest.  There is diffuse bilateral intrahepatic biliary ductal dilatation. The distal common bile duct is completely obstructed.  A 4-French Glidecatheter and a glide wire were used to navigate through the l left biliary ductal system into the common bile duct. The guide wire was then exchanged for the Amplatz wire.  A 8-French percutaneous biliary drain was then advanced over the wire and positioned in the distal common bile duct just proximal to the obstruction under fluoroscopic guidance.  A sample of green bile was aspirated and sent to the lab for culture.  The tube was then connected to a bag for gravity drainage and  secured to the skin using 2-0 Prolene suture.  There is no immediate complication, the patient tolerated the procedure well.  She was returned to the  intensive care unit in unchanged condition.  IMPRESSION:  1.  Percutaneous transhepatic cholangiogram demonstrates complete obstruction of the distal common bile duct with marked common bile duct and intrahepatic ductal dilatation.  The obstruction is relatively smooth.  Differential considerations include obstructing neoplasm (given location ampullary, duodenal, pancreatic and less likely cholangiocarcinoma are possible primaries), and potentially benign biliary stricture.  2.  Distorted common bile duct anatomy secondary to large hiatal hernia and upward herniation of the duodenum, pancreas and distal common bile duct.  3.  Successful placement of an 8 [place] external percutaneous transhepatic biliary drain. Maintain drain to gravity drainage for now.  Once the patient has stabilized, and decompressed she should return interventional radiology for attempted crossing of the obstructing mass/stricture and internalization of the tube.  4. Once stabilized, further evaluation with MRI and MRCP may be useful to evaluate for underlying mass.  Given the patient's difficult anatomy, if ERCP is not possible tissue diagnosis can be obtained by percutaneous transhepatic biopsy at the time of internalization of the tube.  Signed,  Sterling Big, MD Vascular & Interventional Radiologist [facility]  Original Report Authenticated By: Vilma Prader   Ir Biliary Drain Catheter Placement  03/31/2012  *RADIOLOGY REPORT*  Clinical history:71 year old with obstructive jaundice.  The patient currently has an external biliary drain.  PROCEDURE(S): CHOLANGIOGRAM THROUGH EXISTING TUBE; PLACEMENT OF INTERNAL EXTERNAL BILIARY DRAIN  Physician: Rachelle Hora. Henn, MD  Medications:Versed 2 mg IV  Fluoroscopy time: 5.5 minutes  Contrast:  25 ml Omnipaque-300  Procedure:Informed consent was  obtained for a cholangiogram and internalization of biliary drain.  The patient was also consented for a possible brush biopsy.  The patient was placed supine on the interventional table.  The existing biliary drain was prepped and draped in a sterile fashion.  Maximal barrier sterile technique was utilized including caps, mask, sterile gowns, sterile gloves, sterile drape, hand hygiene and skin antiseptic.  Contrast was injected into the biliary drain.  The biliary catheter was cut and removed over a Bentson wire.  5-French Kumpe catheter was used to perform additional cholangiograms.  Catheter was also advanced into the duodenum.  A Bentson wire was placed in the small bowel and a 10-French internal-external biliary drain was placed over a Bentson wire.  The distal aspect of the drain was reconstituted in the small bowel.  Catheter sutured to the skin and attached to a gravity bag.  Findings:The cholangiogram demonstrated an external biliary drain. The patient's anatomy is very unusual in that the common bile duct extends cephalad and is left of midline.  Cholangiogram demonstrated a very large filling defect in the distal common bile duct consistent with a large stone.  There are additional filling defects throughout the biliary system suggestive for additional stones.  The ampulla is widely patent and contrast is now spontaneously draining into the small bowel.  An underlying stricture or lesion was not identified.  Complications: None  Impression:  Successful placement of an internal-external biliary drain.  The cause of the obstructive jaundice is due to biliary stones. There was a very large stone in the distal common bile duct which was mobile throughout the procedure.  Plan:  Plan internal-external biliary drainage until the patient has fully recovered from sepsis and respiratory distress.  Will need GI's opinion about whether ERCP and stone extraction are possible due to the patient's abnormal anatomy.   Otherwise, the patient may be a candidate for percutaneous sphincteroplasty and stone removal.   Original Report Authenticated By: Richarda Overlie,  M.D.    Ir Ptc  03/27/2012  *RADIOLOGY REPORT*  IR PERCUTANEOUS TRANSHEPATIC CHOLANGIOGRAM AND PERCUTANEOUS BILIARY DRAIN PLACEMENT  Date: 03/27/2012  Clinical History: 71 year old female with severe sepsis in the setting of obstructed cholangitis. By CT scan, she has an obstructing mass of uncertain etiology in the distal common bile duct.  The left and right intrahepatic ductal system appeared to communicate to.  We will proceed with placement of a left-sided approach percutaneous biliary drain.  If necessary, bilateral biliary drains will be placed.  Procedures Performed: 1. Ultrasound-guided puncture of a peripheral left bile duct 2.  Percutaneous transhepatic cholangiogram 3.  Placement of a percutaneous transhepatic biliary drain  Interventional Radiologist:  Sterling Big, MD  Sedation: Moderate (conscious) sedation was used.  2 mg Versed, 50 mcg Fentanyl were administered intravenously.  The patient's vital signs were monitored continuously by radiology nursing throughout the procedure.  Sedation Time: 53 minutes  Fluoroscopy time: 23.4  Contrast volume: 60 ml Omnipaque-300 administered into the biliary system  PROCEDURE/FINDINGS:   Informed consent was obtained from the patient following explanation of the procedure, risks, benefits and alternatives. The patient understands, agrees and consents for the procedure. All questions were addressed. A time out was performed.  Maximal barrier sterile technique utilized including caps, mask, sterile gowns, sterile gloves, large sterile drape, hand hygiene, and betadine skin prep.  The mid epigastric region to the left of midline was interrogated with ultrasound.  Multiple dilated left sided intrahepatic bile duct were identified.  A peripheral (3rd order) bile duct was identified.  Local anesthesia was achieved with  infiltration of 1% lidocaine.  Under direct sonographic guidance, the peripheral biliary radicle was punctured with a 21-gauge micropuncture needle. Images obtained stored for the medical record.  An 0.018-inch wire was advanced into the central bile ducts followed by the Accustick sheath.  Hand injection of contrast material through the sheath was performed to retain a percutaneous transhepatic cholangiogram.  The patient's anatomy is markedly distorted secondary to her large hiatal hernia and partial herniation of the stomach, small bowel and pancreas into the chest. Her common bile duct is markedly dilated  and ascends toward the left lower chest.  There is diffuse bilateral intrahepatic biliary ductal dilatation. The distal common bile duct is completely obstructed.  A 4-French Glidecatheter and a glide wire were used to navigate through the l left biliary ductal system into the common bile duct. The guide wire was then exchanged for the Amplatz wire.  A 8-French percutaneous biliary drain was then advanced over the wire and positioned in the distal common bile duct just proximal to the obstruction under fluoroscopic guidance.  A sample of green bile was aspirated and sent to the lab for culture.  The tube was then connected to a bag for gravity drainage and secured to the skin using 2-0 Prolene suture.  There is no immediate complication, the patient tolerated the procedure well.  She was returned to the intensive care unit in unchanged condition.  IMPRESSION:  1.  Percutaneous transhepatic cholangiogram demonstrates complete obstruction of the distal common bile duct with marked common bile duct and intrahepatic ductal dilatation.  The obstruction is relatively smooth.  Differential considerations include obstructing neoplasm (given location ampullary, duodenal, pancreatic and less likely cholangiocarcinoma are possible primaries), and potentially benign biliary stricture.  2.  Distorted common bile duct  anatomy secondary to large hiatal hernia and upward herniation of the duodenum, pancreas and distal common bile duct.  3.  Successful placement  of an 36 [place] external percutaneous transhepatic biliary drain. Maintain drain to gravity drainage for now.  Once the patient has stabilized, and decompressed she should return interventional radiology for attempted crossing of the obstructing mass/stricture and internalization of the tube.  4. Once stabilized, further evaluation with MRI and MRCP may be useful to evaluate for underlying mass.  Given the patient's difficult anatomy, if ERCP is not possible tissue diagnosis can be obtained by percutaneous transhepatic biopsy at the time of internalization of the tube.  Signed,  Sterling Big, MD Vascular & Interventional Radiologist [facility]  Original Report Authenticated By: Vilma Prader   Ir US Guide Bx Asp/drain  03/27/2012  *RADIOLOGY REPORT*  IR PERCUTANEOUS TRANSHEPATIC CHOLANGIOGRAM AND PERCUTANEOUS BILIARY DRAIN PLACEMENT  Date: 03/27/2012  Clinical History: 71 year old female with severe sepsis in the setting of obstructed cholangitis. By CT scan, she has an obstructing mass of uncertain etiology in the distal common bile duct.  The left and right intrahepatic ductal system appeared to communicate to.  We will proceed with placement of a left-sided approach percutaneous biliary drain.  If necessary, bilateral biliary drains will be placed.  Procedures Performed: 1. Ultrasound-guided puncture of a peripheral left bile duct 2.  Percutaneous transhepatic cholangiogram 3.  Placement of a percutaneous transhepatic biliary drain  Interventional Radiologist:  Sterling Big, MD  Sedation: Moderate (conscious) sedation was used.  2 mg Versed, 50 mcg Fentanyl were administered intravenously.  The patient's vital signs were monitored continuously by radiology nursing throughout the procedure.  Sedation Time: 53 minutes  Fluoroscopy time: 23.4  Contrast  volume: 60 ml Omnipaque-300 administered into the biliary system  PROCEDURE/FINDINGS:   Informed consent was obtained from the patient following explanation of the procedure, risks, benefits and alternatives. The patient understands, agrees and consents for the procedure. All questions were addressed. A time out was performed.  Maximal barrier sterile technique utilized including caps, mask, sterile gowns, sterile gloves, large sterile drape, hand hygiene, and betadine skin prep.  The mid epigastric region to the left of midline was interrogated with ultrasound.  Multiple dilated left sided intrahepatic bile duct were identified.  A peripheral (3rd order) bile duct was identified.  Local anesthesia was achieved with infiltration of 1% lidocaine.  Under direct sonographic guidance, the peripheral biliary radicle was punctured with a 21-gauge micropuncture needle. Images obtained stored for the medical record.  An 0.018-inch wire was advanced into the central bile ducts followed by the Accustick sheath.  Hand injection of contrast material through the sheath was performed to retain a percutaneous transhepatic cholangiogram.  The patient's anatomy is markedly distorted secondary to her large hiatal hernia and partial herniation of the stomach, small bowel and pancreas into the chest. Her common bile duct is markedly dilated  and ascends toward the left lower chest.  There is diffuse bilateral intrahepatic biliary ductal dilatation. The distal common bile duct is completely obstructed.  A 4-French Glidecatheter and a glide wire were used to navigate through the l left biliary ductal system into the common bile duct. The guide wire was then exchanged for the Amplatz wire.  A 8-French percutaneous biliary drain was then advanced over the wire and positioned in the distal common bile duct just proximal to the obstruction under fluoroscopic guidance.  A sample of green bile was aspirated and sent to the lab for culture.   The tube was then connected to a bag for gravity drainage and secured to the skin using 2-0 Prolene suture.  There  is no immediate complication, the patient tolerated the procedure well.  She was returned to the intensive care unit in unchanged condition.  IMPRESSION:  1.  Percutaneous transhepatic cholangiogram demonstrates complete obstruction of the distal common bile duct with marked common bile duct and intrahepatic ductal dilatation.  The obstruction is relatively smooth.  Differential considerations include obstructing neoplasm (given location ampullary, duodenal, pancreatic and less likely cholangiocarcinoma are possible primaries), and potentially benign biliary stricture.  2.  Distorted common bile duct anatomy secondary to large hiatal hernia and upward herniation of the duodenum, pancreas and distal common bile duct.  3.  Successful placement of an 8 [place] external percutaneous transhepatic biliary drain. Maintain drain to gravity drainage for now.  Once the patient has stabilized, and decompressed she should return interventional radiology for attempted crossing of the obstructing mass/stricture and internalization of the tube.  4. Once stabilized, further evaluation with MRI and MRCP may be useful to evaluate for underlying mass.  Given the patient's difficult anatomy, if ERCP is not possible tissue diagnosis can be obtained by percutaneous transhepatic biopsy at the time of internalization of the tube.  Signed,  Sterling Big, MD Vascular & Interventional Radiologist [facility] Radiology  Original Report Authenticated By: Alvino Blood Chest Port 1 View  04/03/2012  *RADIOLOGY REPORT*  Clinical Data: Evaluate endotracheal tube.  Increased secretions.  PORTABLE CHEST - 1 VIEW  Comparison: 1 day prior  Findings: Underlying hyperinflation.  Endotracheal tube terminates approximate 1.5 cm above the carina.  Nasogastric tube extends beyond the  inferior aspect of the film.  Right IJ central line  terminates at the low SVC.  Underlying hyperinflation.  Patient rotated right.  Mild cardiomegaly.  Layering bilateral pleural effusions are similar. No pneumothorax.  Biapical pleural thickening.  Similar bibasilar airspace disease.  Increased density projecting over the right upper lung.  IMPRESSION:  1.  Similar appearance of small bilateral pleural effusions and bibasilar airspace disease. 2.  Endotracheal tube borderline low in position.  Consider retraction 2 - 3 cm. 3.  Cannot exclude developing right upper lobe airspace disease. Recommend attention on follow-up.   Original Report Authenticated By: Consuello Bossier, M.D.    Dg Chest Port 1 View  04/02/2012  *RADIOLOGY REPORT*  Clinical Data: Check ET tube.  PORTABLE CHEST - 1 VIEW  Comparison: 04/01/2012  Findings: Support devices are in stable position.  Endotracheal tube tip is at the carina and could be retracted slightly. There is hyperinflation of the lungs compatible with COPD.  Bilateral lower lobe airspace opacities and layering effusions.  Cardiomegaly.  No real change since prior study.  IMPRESSION: No interval change.  Endotracheal tube is near the carina and could be retracted slightly for optimal positioning.   Original Report Authenticated By: Cyndie Chime, M.D.    Dg Chest Port 1 View  04/01/2012  *RADIOLOGY REPORT*  Clinical Data: Evaluate ET tube placement  PORTABLE CHEST - 1 VIEW  Comparison: 03/31/2012  Findings: The endotracheal tube tip is above the carina.  There is a right IJ catheter with tip in the projection of the SVC. Bilateral pleural effusions are unchanged. There is a mild interstitial edema.  Large hiatal hernia is again noted.  IMPRESSION:  1.  No change in position of ET tube. 2.  Persistent pleural effusions and mild interstitial edema.   Original Report Authenticated By: Rosealee Albee, M.D.    Dg Chest Port 1 View  03/31/2012  *RADIOLOGY REPORT*  Clinical Data: Sepsis  PORTABLE CHEST - 1 VIEW  Comparison:  03/29/2012  Findings: Endotracheal tube 2.2 cm above the carina.  Other support apparatus stable.  Bilateral pleural effusions persist with bibasilar airspace disease/consolidation.  Large hiatal hernia noted with left hemidiaphragm elevation as before.  Stable heart size and vascularity.  No pneumothorax.  No significant interval change.  IMPRESSION: Stable portable chest exam   Original Report Authenticated By: Judie Petit. Ruel Favors, M.D.    Dg Chest Port 1 View  03/29/2012  *RADIOLOGY REPORT*  Clinical Data: Endotracheal tube check  PORTABLE CHEST - 1 VIEW  Comparison: Yesterday  Findings: Endotracheal tube tip is 1.7 cm from the carina.  Stable NG tube and epigastric biliary drain.  Right internal jugular vein central venous catheter stable.  Bilateral pleural effusions and bibasilar airspace disease stable.  No pneumothorax.  There is continued shift of the mediastinum to the right worrisome for right lower lobe volume loss.  IMPRESSION: Endotracheal tube 1.7 cm from the carina.  Otherwise stable exam.  Original Report Authenticated By: Donavan Burnet, M.D.   Dg Chest Port 1 View  03/28/2012  *RADIOLOGY REPORT*  Clinical Data: Respiratory difficulty  PORTABLE CHEST - 1 VIEW  Comparison: 03/26/2012  Findings: Cardiomegaly.  Endotracheal tube and right internal jugular vein central venous catheter stable.  NG tube tip is in the body of the stomach.  Epigastric biliary drain placed.  Bilateral pleural effusions and bilateral airspace disease not significantly changed.  No pneumothorax.  IMPRESSION: NG tube placed.  Stable bilateral airspace disease and bilateral pleural effusions.  Original Report Authenticated By: Donavan Burnet, M.D.   Dg Chest Port 1 View  03/26/2012  *RADIOLOGY REPORT*  Clinical Data: Endotracheal tube placement.  Central venous catheter placement.  PORTABLE CHEST - 1 VIEW  Comparison: 03/26/2012.  Findings: Endotracheal tube is 15 mm from the carina.  New right IJ central line is present  with its tip one vertebral body below the carina, in the mid-to-lower SVC.  Large bilateral pleural effusions.  Cardiopericardial silhouette mostly obscured by effusions.  Basilar atelectasis.  Pulmonary vascular congestion. Bobby pin projects over the right upper chest.  IMPRESSION:  1.  New endotracheal tube is 15 mm from the carina. 2.  Uncomplicated right IJ central line placement with the tip in the mid SVC. 3.  Bilateral pleural effusions, left greater than right with associated compressive atelectasis.  Probable cardiomegaly with portions of the cardiopericardial silhouette obscured  Original Report Authenticated By: Andreas Newport, M.D.   Dg Chest Port 1 View  03/26/2012  *RADIOLOGY REPORT*  Clinical Data: Chest pressure.  PORTABLE CHEST - 1 VIEW  Comparison: 03/24/2012.  Findings: The cardiopericardial size silhouette is obscured. Surgical clip is present in the lower chest. Bilateral left greater than right pleural effusions are present, increased from prior exam.  Additionally, there is pulmonary vascular congestion and bilateral lower lobe airspace disease, most compatible with pulmonary edema in the interval since the prior exam.  IMPRESSION: Findings compatible with moderate CHF superimposed on chronic changes.  Bilateral left greater than right pleural effusions.  Original Report Authenticated By: Andreas Newport, M.D.    Scheduled Meds:   . albuterol  2.5 mg Nebulization Q6H  . aspirin EC  81 mg Oral Daily  . feeding supplement  1 Container Oral TID BM  . furosemide  20 mg Intravenous Daily  . Gerhardt's butt cream   Topical Daily  . insulin aspart  0-9 Units Subcutaneous TID WC  . ipratropium  0.5 mg Nebulization  Q6H  . multivitamin with minerals  1 tablet Oral Daily  . pantoprazole  40 mg Oral Q1200  . piperacillin-tazobactam (ZOSYN)  IV  3.375 g Intravenous Q8H  . potassium chloride  20 mEq Oral Daily   Continuous Infusions:   Principal Problem:  *Septic shock Active  Problems:  Chest pain syndrome  CAD (coronary artery disease)  COPD (chronic obstructive pulmonary disease)  Anxiety  CHF (congestive heart failure)  Vitamin D deficiency disease  Suicidal ideation  UTI (lower urinary tract infection)  Hypotension  Fever  Acute respiratory failure with hypoxia  Acute cholangitis        [facility]  [facility] Pager [phone]. If 8PM-8AM, please contact night-coverage at [email or web address], password [facility] 04/05/2012, 6:55 PM  LOS: 12 days

## 2012-04-05 NOTE — Evaluation (Signed)
Physical Therapy Evaluation Patient Details Name: Laurie Sparks MRN: [id number] DOB: 1941-07-06 Today's Date: 04/05/2012 Time: 4098-1191 PT Time Calculation (min): 17 min  PT Assessment / Plan / Recommendation Clinical Impression  Patient is a 71 yo female admitted with chest pain.  Patient with complicated course with septic shock, biliary obstruction (with drain) and respiratory failure.  Patient with general weakness due to prolonged hospitalization.  Will benefit from PT for strengthening, mobility/gait training and education.  Recommend SNF at discharge for continued therapy.    PT Assessment  Patient needs continued PT services    Follow Up Recommendations  Skilled nursing facility    Barriers to Discharge        Equipment Recommendations  Defer to next venue    Recommendations for Other Services     Frequency Min 3X/week    Precautions / Restrictions Precautions Precautions: Fall Restrictions Weight Bearing Restrictions: No         Mobility  Bed Mobility Bed Mobility: Supine to Sit;Sitting - Scoot to Edge of Bed;Sit to Supine Supine to Sit: 5: Supervision;HOB flat Sitting - Scoot to Edge of Bed: 5: Supervision Sit to Supine: 5: Supervision;HOB flat Details for Bed Mobility Assistance: Verbal cues for technique.  No physical assist needed. Transfers Transfers: Sit to Stand;Stand to Sit Sit to Stand: 4: Min assist;With upper extremity assist;From bed Stand to Sit: 4: Min assist;With upper extremity assist;To bed Details for Transfer Assistance: Verbal cues for hand placement and safety.  Physical assist to initiate sit to stand. Ambulation/Gait Ambulation/Gait Assistance: 4: Min assist Ambulation Distance (Feet): 8 Feet Assistive device: Rolling walker Ambulation/Gait Assistance Details: Verbal cues for safe use of RW.  Patient with flexed posture.  Very limited by fatigue. Gait Pattern: Step-through pattern;Decreased stride length;Shuffle;Trunk flexed Gait  velocity: Slow gait speed    Exercises     PT Diagnosis: Difficulty walking;Generalized weakness;Acute pain  PT Problem List: Decreased strength;Decreased activity tolerance;Decreased balance;Decreased mobility;Decreased knowledge of use of DME;Cardiopulmonary status limiting activity;Pain PT Treatment Interventions: DME instruction;Gait training;Functional mobility training;Balance training;Therapeutic exercise;Patient/family education   PT Goals Acute Rehab PT Goals PT Goal Formulation: With patient Time For Goal Achievement: 04/19/12 Potential to Achieve Goals: Good Pt will go Supine/Side to Sit: Independently;with HOB not 0 degrees (comment degree) PT Goal: Supine/Side to Sit - Progress: Goal set today Pt will go Sit to Supine/Side: Independently;with HOB not 0 degrees (comment degree) PT Goal: Sit to Supine/Side - Progress: Goal set today Pt will go Sit to Stand: with supervision;with upper extremity assist PT Goal: Sit to Stand - Progress: Goal set today Pt will go Stand to Sit: with supervision;with upper extremity assist PT Goal: Stand to Sit - Progress: Goal set today Pt will Ambulate: 51 - 150 feet;with supervision;with rolling walker PT Goal: Ambulate - Progress: Goal set today  Visit Information  Last PT Received On: 04/05/12 Assistance Needed: +1    Subjective Data  Subjective: "I did better than I thought I would" Patient Stated Goal: To walk.  To get stronger   Prior Functioning  Home Living Lives With: Son Available Help at Discharge: Family;Available 24 hours/day Type of Home: House Home Access: Stairs to enter [employer] of Steps: 3 Entrance Stairs-Rails: Right;Left Home Layout: One level Bathroom Shower/Tub: Health visitor: Standard Bathroom Accessibility: Yes How Accessible: Accessible via walker Home Adaptive Equipment: Bedside commode/3-in-1;Shower chair with back;Straight cane;Walker - rolling;Wheelchair - manual;Hospital  bed Prior Function Level of Independence: Independent with assistive device(s);Needs assistance Needs Assistance: Meal Prep;Light Housekeeping (  uses cane for ambulation) Meal Prep: Maximal Light Housekeeping: Total Able to Take Stairs?: Yes (with assist) Driving: No Vocation: Retired Musician: No difficulties    Cognition  Overall Cognitive Status: Appears within functional limits for tasks assessed/performed Arousal/Alertness: Awake/alert Orientation Level: Appears intact for tasks assessed Behavior During Session: [facility] for tasks performed    Extremity/Trunk Assessment Right Upper Extremity Assessment RUE ROM/Strength/Tone: Deficits RUE ROM/Strength/Tone Deficits: General weakness 4-/5 Left Upper Extremity Assessment LUE ROM/Strength/Tone: Deficits LUE ROM/Strength/Tone Deficits: General weakness 4-/5 Right Lower Extremity Assessment RLE ROM/Strength/Tone: Deficits RLE ROM/Strength/Tone Deficits: General weakness 4/5 Left Lower Extremity Assessment LLE ROM/Strength/Tone: Deficits LLE ROM/Strength/Tone Deficits: General weakness 4/5 Trunk Assessment Trunk Assessment: Kyphotic   Balance    End of Session PT - End of Session Equipment Utilized During Treatment: Gait belt;Oxygen Activity Tolerance: Patient limited by fatigue Patient left: in bed;with call bell/phone within reach Nurse Communication: Mobility status  GP     Vena Austria 04/05/2012, 1:52 PM Durenda Hurt. Renaldo Fiddler, [facility] Acute Rehab Services Pager [phone]

## 2012-04-05 NOTE — Progress Notes (Signed)
Drain is putting out over a liter per day of bile.  Will sign off for now as patient is not surgical candidate currently.    Marta Lamas. Gae Bon, MD, FACS [phone] [facility] Surgery

## 2012-04-06 DIAGNOSIS — R6521 Severe sepsis with septic shock: Secondary | ICD-10-CM

## 2012-04-06 DIAGNOSIS — R6520 Severe sepsis without septic shock: Secondary | ICD-10-CM

## 2012-04-06 DIAGNOSIS — A419 Sepsis, unspecified organism: Secondary | ICD-10-CM

## 2012-04-06 LAB — GLUCOSE, CAPILLARY
Glucose-Capillary: 100 mg/dL — ABNORMAL HIGH (ref 70–99)
Glucose-Capillary: 160 mg/dL — ABNORMAL HIGH (ref 70–99)
Glucose-Capillary: 143 mg/dL — ABNORMAL HIGH (ref 70–99)

## 2012-04-06 LAB — BASIC METABOLIC PANEL
BUN: 14 mg/dL (ref 6–23)
Chloride: 98 mEq/L (ref 96–112)
GFR calc Af Amer: >90 mL/min (ref >90)
Potassium: 3.5 mEq/L (ref 3.5–5.1)

## 2012-04-06 LAB — CBC
HCT: 28.3 % — ABNORMAL LOW (ref 36.0–46.0)
RDW: 14.2 % (ref 11.5–15.5)
WBC: 8 10*3/uL (ref 4.0–10.5)

## 2012-04-06 MED ORDER — SODIUM CHLORIDE 0.9 % IV SOLN
3.0000 g | Freq: Four times a day (QID) | INTRAVENOUS | Status: DC
  Administered 2012-04-06 – 2012-04-08 (×8): 3 g via INTRAVENOUS
  Filled 2012-04-06 (×13): qty 3

## 2012-04-06 MED ORDER — HYDROCODONE-ACETAMINOPHEN 10-325 MG PO TABS
2.0000 | ORAL_TABLET | ORAL | Status: DC | PRN
  Administered 2012-04-06 – 2012-04-08 (×9): 2 via ORAL
  Filled 2012-04-06 (×10): qty 2

## 2012-04-06 NOTE — Progress Notes (Signed)
ANTIBIOTIC CONSULT NOTE - FOLLOW UP  Pharmacy Consult for Zosyn Indication: cholangitis  Allergies  Allergen Reactions  . Ambien (Zolpidem Tartrate) Other (See Comments)    Hallucinate   . Other     Base metal and something else   Vital Signs: Temp: 98.2 F (36.8 C) (08/26 0500) Temp src: Oral (08/26 0500) BP: 100/64 mmHg (08/26 1009) Pulse Rate: 97  (08/26 1009) Intake/Output from previous day: 08/25 0701 - 08/26 0700 In: 10  Out: 1350 [Urine:550; Drains:800] Intake/Output from this shift: Total I/O In: 240 [P.O.:240] Out: 400 [Urine:400]  Labs:  [facility] 04/06/12 0511 04/05/12 0612 04/04/12 0415  WBC 8.0 11.9* 13.9*  HGB 8.9* 9.5* 8.9*  PLT 306 291 241  LABCREA -- -- --  CREATININE 0.44* 0.49* 0.39*   Estimated Creatinine Clearance: 43.8 ml/min (by C-G formula based on Cr of 0.44).  Assessment: 70yof continues on Zosyn for septic shock (now resolved) 2/2 cholangitis s/p biliary drain 8/16. Patient remains afebrile, leukocytosis is improving, and PCT is down to 0.33. Renal function has remained stable and Zosyn dose is appropriate.  8/14 Cefepime >>8/20 Zosyn 8/15 >>8/17, Restart 8/20 >> Vancomycin 8/14 >>8/20 8/17 Flagyl >>8/20 8/17 Fortaz >>8/20  8/14 Urine cx: Coag neg staph FINAL 8/14 Blood cx x2: E Coli, enterococcus (interm to vanc) FINAL 8/16 Bile cx: E coli , enteroccous FINAL 8/18 VRE cx: negative, FINAL  Goal of Therapy:  Appropriate Zosyn dosing  Plan:  1) Continue Zosyn 3.375g IV q8 (4h infusion) 2) Follow up renal function, LOT, clinical status  Fredrik Rigger 04/06/2012,12:05 PM

## 2012-04-06 NOTE — Consult Note (Signed)
[facility]  Total days of antibiotics: 13        Day 7:  Zosyn              Reason for Consult: Cholangitis and bacteremia   Referring Physician: Kathlen Mody, MD   Principal Problem:  *Septic shock Active Problems:  Chest pain syndrome  CAD (coronary artery disease)  COPD (chronic obstructive pulmonary disease)  Anxiety  CHF (congestive heart failure)  Vitamin D deficiency disease  Suicidal ideation  UTI (lower urinary tract infection)  Hypotension  Fever  Acute respiratory failure with hypoxia  Acute cholangitis   . albuterol  2.5 mg Nebulization Q6H  . aspirin EC  81 mg Oral Daily  . feeding supplement  1 Container Oral TID BM  . furosemide  20 mg Intravenous Daily  . Gerhardt's butt cream   Topical Daily  . insulin aspart  0-9 Units Subcutaneous TID WC  . ipratropium  0.5 mg Nebulization Q6H  . multivitamin with minerals  1 tablet Oral Daily  . pantoprazole  40 mg Oral Q1200  . piperacillin-tazobactam (ZOSYN)  IV  3.375 g Intravenous Q8H  . potassium chloride  20 mEq Oral Daily   HPI: Quinci Gavidia is a 71 y.o. female PMH of CAD, CSF and prior cholecystectomy p/w chest pain and subjective feeling of fever admitted 13 days before and complicated by hypotension, tachycardia and hypoxia. On admission acute cardio- pulmonary causes were excluded. CT abdomen pelvis on 8/15 positive for dilatation of CBD with a 2.7 x 1.9 cm obstructing mass near the ampulla. S/p IR guided biliary (CBD) drain on 8/16 followed by IR guided placement of an internal-external biliary drain on 8/20 due to cholangitis.  2/2 blood culture from 8/14 positive with E.coli and Enterococcus Gallinarum. Broad spectrum antibiotic was given from day 1. Pt is on Zosyn now. She has been afebrile for last 24 hrs. Pt was c/o mild chest pain and cough with blood tinged phlegm once this morning. No fever, chills, night sweats, n/v, weight loss, dysuria or diarrhea. Family h/o TB but her  skin test for TB 1 or 2 years before was negative.  Review of Systems: Pertinent items are noted in HPI.  Past Medical History  Diagnosis Date  . Coronary artery disease   . Myocardial infarct   . CHF (congestive heart failure)     History  Substance Use Topics  . Smoking status: Never Smoker   . Smokeless tobacco: Not on file  . Alcohol Use: No    No family history on file. Allergies  Allergen Reactions  . Ambien (Zolpidem Tartrate) Other (See Comments)    Hallucinate   . Other     Base metal and something else    OBJECTIVE: Blood pressure 101/66, pulse 95, temperature 98.5 F (36.9 C), temperature source Oral, resp. rate 16, height 5\' 1"  (1.549 m), weight 43 kg (94 lb 12.8 oz), SpO2 100.00%.  General: resting in bed, NAD HEENT: PERRL, EOMI, no scleral icterus Skin: Bruises in lt forearm and rt side of the abdomen. No rashes. Cardiac: RRR, no rubs, murmurs or gallops Pulm: clear to auscultation bilaterally, moving normal volumes of air Abd: soft, nontender, nondistended, BS present. Internal -external biliary drainage on the epigastrium Ext: warm and well perfused, no pedal edema Neuro: alert and oriented X3, cranial nerves II-XII grossly intact  Microbiology: Recent Results (from the past 240 hour(s))  VRE CULTURE     Status: Normal   Collection  Time   03/29/12  5:16 AM      Component Value Range Status Comment   Specimen Description STOOL   Final    Special Requests NONE   Final    Culture NEGATIVE FOR VANCOMYCIN RESISTANT ENTEROCOCCI   Final    Report Status 04/04/2012 FINAL   Final   Lab results: Basic Metabolic Panel:  Lab 04/06/12 1610 04/05/12 0612 04/04/12 0415 04/02/12 0414  NA 137 139 -- --  K 3.5 4.4 -- --  CL 98 100 -- --  CO2 31 33* -- --  GLUCOSE 96 105* -- --  BUN 14 16 -- --  CREATININE 0.44* 0.49* -- --  CALCIUM 8.7 9.1 -- --  MG -- -- 2.1 2.3  PHOS -- -- 2.9 2.6   Liver Function Tests:  Lab 04/02/12 0414 03/31/12 0419  AST 18 18    ALT 29 49*  ALKPHOS 98 140*  BILITOT 1.1 2.0*  PROT 4.7* 4.7*  ALBUMIN 2.1* 2.1*    Lab 04/01/12 0415  LIPASE 139*  AMYLASE 66   CBC:  Lab 04/06/12 0511 04/05/12 0612 03/31/12 0419  WBC 8.0 11.9* --  NEUTROABS -- -- 18.1*  HGB 8.9* 9.5* --  HCT 28.3* 30.1* --  MCV 98.3 99.0 --  PLT 306 291 --   CBG:  Lab 04/06/12 1209 04/06/12 0754 04/05/12 2143 04/05/12 1710 04/05/12 1205 04/05/12 0800  GLUCAP 160* 100* 194* 122* 123* 110*    Assessment & Plan:  Cholangitis and Bacteremia: Salma Walrond is a 71 y.o. female PMH of CAD, CSF and prior cholecystectomy p/w chest pain and subjective feeling of fever admitted 13 days before and complicated by septic shock.  CT abdomen pelvis on 8/15 positive for dilatation of CBD with a mass s/p IR guided  placement of biliary (CBD) drain due to cholangitis. Pt's chest pain improved. Vital signs stable. She has been afebrile >24 hrs. WBC, BMP and LFTs are on normal range.  2/2 blood culture from 8/14 positive with E.coli and Enterococcus Gallinarum. Broad spectrum antibiotic was given from day 1. Pt is on Zosyn from 8/20. E.coli and Enterococcus Gallinarum both are sensitive to ampicillin WITH SULBACTAM. So, we will narrow down to Ampicillin/sulbactum for next 7 days. That way can cover total 14 days course. We might switch to Augmentin if needed later for antibiotic coverage.    There is a concern for endocarditis in enterococcus bacteremia.TTE on 8/14 was negative for any valvular vegetations. We might consider for TEE in later time.  1.  Continue Ampicillin/sulbactum 3 g, IV, Q6H 2. F/u blood culture   This is a Psychologist, occupational Note.  The care of the patient was discussed with Dr. Daiva Eves and the assessment and plan was formulated with their assistance.  Please see their note for official documentation of the patient encounter.     Signed: Junious Silk 04/06/2012, 3:30 PM    INFECTIOUS DISEASE ATTENDING ADDENDUM:     [facility]   Date: 04/06/2012  Patient name: Trinidy Masterson  Medical record number: [id number]  Date of birth: 07/21/1941    This patient has been seen and discussed with the house staff. Please see their note for complete details. I concur with their findings with the following additions/corrections:  71 year old admitted with acute cholangitis complicated by polymicrobial bactermia with AMP sensitive Enterococcus (but I to vancomycin) and E. Coli in 2/2 blood cultures on admission and also from biliary material when drain placed.  The patient needs AT least two weeks of continuous IV antibiotics for her polymicrobial infection.  By my count this is 6TH STRAIGHT DAY OF UNINTERUPTED ABX EFFECTIVE VS BOTH PATHOGENS.  I WILL NARROW TO UNASYN 3G IV Q 6 HOURS AND WOULD LIKE TO MAKE SURE SHE RECEIVES AN ADDITIONAL 8 DAYS OF IV UNASYN (AFTER FINISHING TODAYS THERAPY) THRU 04/14/12.  ON 04/14/12 I THINK IT WOULD BE REASONABLE TO CHANGE HER TO AUGMENTIN875/125 bid and continue this until her ERCP is performed with extraction of her stones.  I will arrange for outpatient followup of the patient with me in the [facility] clinic.  Acey Lav 04/06/2012, 5:51 PM

## 2012-04-06 NOTE — Clinical Social Work Placement (Addendum)
     Clinical Social Work Department CLINICAL SOCIAL WORK PLACEMENT NOTE 04/07/2012  Patient:  Laurie Sparks, Laurie Sparks  Account Number:  [id number] Admit date:  03/24/2012  Clinical Social Worker:  Doree Albee  Date/time:  04/06/2012 04:55 PM  Clinical Social Work is seeking post-discharge placement for this patient at the following level of care:   SKILLED NURSING   (*CSW will update this form in Epic as items are completed)   04/06/2012  Patient/family provided with [facility] Department of Clinical Social Works list of facilities offering this level of care within the geographic area requested by the patient (or if unable, by the patients family).  04/06/2012  Patient/family informed of their freedom to choose among providers that offer the needed level of care, that participate in Medicare, Medicaid or managed care program needed by the patient, have an available bed and are willing to accept the patient.  04/06/2012  Patient/family informed of [facility] ownership interest in [facility], as well as of the fact that they are under no obligation to receive care at this facility.  PASARR submitted to EDS on 04/06/2012 PASARR number received from EDS on   FL2 transmitted to all facilities in geographic area requested by pt/family on  04/06/2012 FL2 transmitted to all facilities within larger geographic area on   Patient informed that his/her managed care company has contracts with or will negotiate with  certain facilities, including the following:     Patient/family informed of bed offers received:  04/07/2012 Patient chooses bed at [facility] Physician recommends and patient chooses bed at    Patient to be transferred to  on   Patient to be transferred to facility by   The following physician request were entered in Epic:   Additional Comments:

## 2012-04-06 NOTE — Evaluation (Signed)
Occupational Therapy Evaluation Patient Details Name: Laurie Sparks MRN: [id number] DOB: 1941-04-28 Today's Date: 04/06/2012 Time: 0865-7846 OT Time Calculation (min): 21 min  OT Assessment / Plan / Recommendation Clinical Impression  71 yo female admitted with chest pain.  Patient with complicated course with septic shock, biliary obstruction (with drain) and respiratory failure.  Patient with general weakness due to prolonged hospitalization.OT to follow acutely. Recommend SNF for d/c planning    OT Assessment  Patient needs continued OT Services    Follow Up Recommendations  Skilled nursing facility    Barriers to Discharge      Equipment Recommendations  Rolling walker with 5" wheels;3 in 1 bedside comode    Recommendations for Other Services    Frequency  Min 2X/week    Precautions / Restrictions Precautions Precautions: Fall Restrictions Weight Bearing Restrictions: No   Pertinent Vitals/Pain none    ADL  Eating/Feeding: Simulated;Modified independent Where Assessed - Eating/Feeding: Chair Grooming: Performed;Wash/dry face;Wash/dry hands;Teeth care;Supervision/safety Where Assessed - Grooming: Unsupported standing Upper Body Dressing: Performed;Modified independent Where Assessed - Upper Body Dressing: Unsupported sitting Toilet Transfer: Simulated;Supervision/safety Toilet Transfer Method: Sit to Barista: Raised toilet seat with arms (or 3-in-1 over toilet) Toileting - Clothing Manipulation and Hygiene: Simulated;Min guard Where Assessed - Toileting Clothing Manipulation and Hygiene: Sit to stand from 3-in-1 or toilet Equipment Used: Gait belt;Rolling walker Transfers/Ambulation Related to ADLs: pt ambulated 30 ft with decreased gait velocity and decreased activity tolerance ADL Comments: Pt agreeable to OT evaluation and very pleasant. pt required oxgyen placed at sink level due to 3 out 4 dyspnea. Pt opened all containers with setup    OT  Diagnosis: Generalized weakness  OT Problem List: Decreased strength;Decreased activity tolerance;Impaired balance (sitting and/or standing);Decreased knowledge of use of DME or AE OT Treatment Interventions: Self-care/ADL training;Therapeutic exercise;Energy conservation;DME and/or AE instruction;Therapeutic activities;Patient/family education;Balance training   OT Goals Acute Rehab OT Goals OT Goal Formulation: With patient Time For Goal Achievement: 04/20/12 Potential to Achieve Goals: Good ADL Goals Pt Will Perform Grooming: with modified independence;Unsupported;Standing at sink ADL Goal: Grooming - Progress: Goal set today Pt Will Perform Upper Body Bathing: with modified independence;Standing at sink;Unsupported ADL Goal: Upper Body Bathing - Progress: Goal set today Pt Will Perform Lower Body Bathing: with set-up;Sit to stand from chair;Unsupported ADL Goal: Lower Body Bathing - Progress: Goal set today Pt Will Perform Upper Body Dressing: with modified independence;Sit to stand from chair ADL Goal: Upper Body Dressing - Progress: Goal set today Pt Will Perform Lower Body Dressing: with set-up;Sit to stand from chair;Unsupported ADL Goal: Lower Body Dressing - Progress: Goal set today Pt Will Transfer to Toilet: with set-up;3-in-1;Ambulation ADL Goal: Toilet Transfer - Progress: Goal set today Miscellaneous OT Goals Miscellaneous OT Goal #1: Pt will demonstrate 2 EC techniques during ADLS at sink level  OT Goal: Miscellaneous Goal #1 - Progress: Goal set today  Visit Information  Last OT Received On: 04/06/12 Assistance Needed: +1 PT/OT Co-Evaluation/Treatment: Yes    Subjective Data  Subjective: "I would like to get up and brush my teeth" Patient Stated Goal: to get back to moving   Prior Functioning  Vision/Perception  Home Living Lives With: Son Available Help at Discharge: Family;Available 24 hours/day Type of Home: House Home Access: Stairs to enter [employer] of Steps: 3 Entrance Stairs-Rails: Right;Left Home Layout: One level Bathroom Shower/Tub: Health visitor: Standard Bathroom Accessibility: Yes How Accessible: Accessible via walker Home Adaptive Equipment: Bedside commode/3-in-1;Shower chair with back;Straight cane;Walker -  rolling;Wheelchair - manual;Hospital bed Prior Function Level of Independence: Independent with assistive device(s);Needs assistance Needs Assistance: Meal Prep;Light Housekeeping Meal Prep: Maximal Light Housekeeping: Total Able to Take Stairs?: Yes Driving: No Vocation: Retired Musician: No difficulties Dominant Hand: Right      Cognition  Overall Cognitive Status: Appears within functional limits for tasks assessed/performed Arousal/Alertness: Awake/alert Orientation Level: Appears intact for tasks assessed Behavior During Session: [facility] for tasks performed    Extremity/Trunk Assessment Right Upper Extremity Assessment RUE ROM/Strength/Tone: Deficits RUE ROM/Strength/Tone Deficits: General weakness 4-/5 RUE Sensation: WFL - Light Touch RUE Coordination: WFL - gross/fine motor Left Upper Extremity Assessment LUE ROM/Strength/Tone: Deficits LUE ROM/Strength/Tone Deficits: General weakness 4-/5 LUE Coordination: WFL - gross/fine motor Trunk Assessment Trunk Assessment: Kyphotic   Mobility Bed Mobility Supine to Sit: 5: Supervision;HOB flat Sitting - Scoot to Edge of Bed: 5: Supervision Sit to Supine: 5: Supervision;HOB flat Details for Bed Mobility Assistance: only v/c for drain placement during transfer needed Transfers Sit to Stand: 5: Supervision;With upper extremity assist;From bed Stand to Sit: 5: Supervision;With upper extremity assist;To chair/3-in-1   Exercise    Balance    End of Session OT - End of Session Activity Tolerance: Patient limited by fatigue Patient left: in chair;with call bell/phone within reach Nurse Communication: Mobility  status;Precautions  GO     Lucile Shutters 04/06/2012, 10:20 AM Pager: [phone]

## 2012-04-06 NOTE — Progress Notes (Signed)
Progress Note: Follow up from weekend and duration of hospitalization.  Met with patient at the bedside who is sitting in the chair, alert and oriented about to take her medications. She is pleasant and cooperative during assessment reporting she has gotten some of her hope back. Patient is aware of reason for visit and reports she remembers stating "I don't want to live like this anymore", passive thoughts of suicide.  Patient reports she was in a lot of pain and struggled living day to day like this. Patient reports now that her pain is controlled her goal is to live and she has a strong faith in God. Patient reports she is depressed, however she relies of prayer and journaling during the day to help alleviate negative thoughts and loss of hope.  Patient reports she internalizes her depression and her loss of hope because she feels guilty displacing those feelings on her children or friends.  She reports she is not hopeless any longer and does not have thoughts or plans for suicide due to being a sin in the Bible and she does not want to displease God.  Discussed disposition with patient and son also entered room to join conversation. Discussed recommendations for patient to go to SNF and patient agreeable for search to be started. Son reports that his brother lives with patient and he lives next door, but patient has steps and is very weak at this time. He is interested in initiating SNF search and bed list given to patient and son to review and son to go and see.  No psych follow up needed at this time, due to patient reporting she will not follow up. Discussed plan with unit CSW and plan for SNF and passar.  Will update Psych MD, however at this time will sign off due to no other interventions needed. If needs arise, please re consult: [phone].  Ashley Jacobs, MSW LCSW [phone]

## 2012-04-06 NOTE — Progress Notes (Signed)
[facility] PROGRESS NOTE  Laurie Sparks AVW:[id number] DOB: 1940-08-21 DOA: 03/24/2012 PCP: Warrick Parisian, MD  Assessment/Plan: Principal Problem:  *Septic shock Active Problems:  Chest pain syndrome  CAD (coronary artery disease)  COPD (chronic obstructive pulmonary disease)  Anxiety  CHF (congestive heart failure)  Vitamin D deficiency disease  Suicidal ideation  UTI (lower urinary tract infection)  Hypotension  Fever  Acute respiratory failure with hypoxia  Acute cholangitis  Acute on chronic hypoxemic respiratory failure due to pulmonary edema, effusions; COPD on 3 L O2 at home  Resolved resp failure 8/23. On 2 lit nasal canula oxygen.  Septic Shock: resolved. Leukocytosis resolved. currently on unasyn. Oliguria and Hyponatremia : resolved.   Elevated transaminases and alk phos, improving,due to cholangitis (hx chole), CBD obstruction due to Primary Gall stone. Anatomy complicated by huge hiatal hernia. HX of multiple lap In past  -8/16 external biliary drain  - 817 Start tube feeds  - on 03/31/12: IR procedure - gall stones blocking CBD. No evidence of malignancy. S/p internal external biliary drain.  - 8/22 - GI and [facility] opinion: procedure to remove primary bile duct not technically feasible. So, IR plans sphincterotomy through int-external biliary drain in 2 weeks from 04/02/12 ID consult obtained for the duration of antibiotics for cholangitis.          Brief narrative: 71 y/o female with prior cholecystectomy, CHF and CAD admitted 8/14 with chest pain developed hypotension, tachycardia and worsening hypoxemic respiratory failure likely in the setting of sepsis likely due to cholangitis vs. Less likely SBO/abdominal process. Final dx: Primary CBD stones with obstructive jaundice, cholangitis, septic shock   Consultants:  Surgery  PCCM  IR  GI  Procedures:    Antibiotics: unasyn.  HPI/Subjective: Feeling much better   Objective: Filed  Vitals:   04/06/12 0738 04/06/12 1009 04/06/12 1410 04/06/12 1450  BP:  100/64 101/66   Pulse:  97 95   Temp:   98.5 F (36.9 C)   TempSrc:   Oral   Resp:   16   Height:      Weight:      SpO2: 97%  100% 100%    Intake/Output Summary (Last 24 hours) at 04/06/12 1926 Last data filed at 04/06/12 1813  Gross per 24 hour  Intake    642 ml  Output   1325 ml  Net   -683 ml   Filed Weights   04/02/12 0500 04/03/12 0500 04/04/12 0500  Weight: 49.1 kg (108 lb 3.9 oz) 45.8 kg (100 lb 15.5 oz) 43 kg (94 lb 12.8 oz)    Exam:   General:  Alert afebrile comfortable  Cardiovascular: s1s2  Respiratory: ctab  Abdomen: soft NT ND BS+  Data Reviewed: Basic Metabolic Panel:  Lab 04/06/12 8295 04/05/12 0612 04/04/12 0415 04/03/12 0500 04/02/12 0414 04/01/12 0415 03/31/12 0419  NA 137 139 138 139 145 -- --  K 3.5 4.4 4.0 3.7 3.9 -- --  CL 98 100 99 100 105 -- --  CO2 31 33* 34* 35* 37* -- --  GLUCOSE 96 105* 83 132* 110* -- --  BUN 14 16 16 17 15  -- --  CREATININE 0.44* 0.49* 0.39* 0.45* 0.50 -- --  CALCIUM 8.7 9.1 8.9 8.5 8.5 -- --  MG -- -- 2.1 -- 2.3 1.9 2.1  PHOS -- -- 2.9 -- 2.6 2.9 1.2*   Liver Function Tests:  Lab 04/02/12 0414 03/31/12 0419  AST 18 18  ALT 29 49*  ALKPHOS 98 140*  BILITOT 1.1 2.0*  PROT 4.7* 4.7*  ALBUMIN 2.1* 2.1*    Lab 04/01/12 0415  LIPASE 139*  AMYLASE 66   No results found for this basename: AMMONIA:5 in the last 168 hours CBC:  Lab 04/06/12 0511 04/05/12 0612 04/04/12 0415 04/02/12 0414 04/01/12 0415 03/31/12 0419  WBC 8.0 11.9* 13.9* 16.0* 19.1* --  NEUTROABS -- -- -- -- -- 18.1*  HGB 8.9* 9.5* 8.9* 8.6* 9.2* --  HCT 28.3* 30.1* 27.5* 26.0* 27.4* --  MCV 98.3 99.0 97.2 95.9 92.3 --  PLT 306 291 241 144* 96* --   Cardiac Enzymes: No results found for this basename: CKTOTAL:5,CKMB:5,CKMBINDEX:5,TROPONINI:5 in the last 168 hours BNP (last 3 results)  Basename 04/04/12 0415 04/03/12 0505 04/01/12 0415  PROBNP 1006.0* 1237.0*  2793.0*   CBG:  Lab 04/06/12 1643 04/06/12 1209 04/06/12 0754 04/05/12 2143 04/05/12 1710  GLUCAP 76 160* 100* 194* 122*    Recent Results (from the past 240 hour(s))  VRE CULTURE     Status: Normal   Collection Time   03/29/12  5:16 AM      Component Value Range Status Comment   Specimen Description STOOL   Final    Special Requests NONE   Final    Culture NEGATIVE FOR VANCOMYCIN RESISTANT ENTEROCOCCI   Final    Report Status 04/04/2012 FINAL   Final      Studies: Dg Chest 2 View  03/24/2012  *RADIOLOGY REPORT*  Clinical Data: Mid and left chest pain.  CHEST - 2 VIEW  Comparison: PA and lateral chest 07/26/2010.  Findings: Marked elevation of the left hemidiaphragm is again seen. The chest is hyperexpanded.  Blunting of the right costophrenic angle is unchanged.  Heart size is upper normal.  IMPRESSION: No acute finding.  Stable compared to prior exam.  Original Report Authenticated By: Bernadene Bell. Maricela Curet, M.D.   Ct Abdomen Pelvis W Contrast  03/26/2012  *RADIOLOGY REPORT*  Clinical Data: Abdominal pain, septic shock, nausea/vomiting  CT ABDOMEN AND PELVIS WITH CONTRAST  Technique:  Multidetector CT imaging of the abdomen and pelvis was performed following the standard protocol during bolus administration of intravenous contrast.  Contrast: 80mL OMNIPAQUE IOHEXOL 300 MG/ML  SOLN  Comparison: None.  Findings: Very large hiatal hernia, incompletely visualized, containing stomach to the right of midline (series 2/image 7), multiple loops of opacified and nonopacified bowel, and pancreas centrally (series 2/image 6).  Liver is notable for moderate to severe central intrahepatic ductal dilatation.  Common bile duct is markedly dilated with a 1.5 x 1.0 cm abnormal soft tissue lesion centrally (series 2/image 22) and a 2.7 x 1.9 cm obstructing mass distally near the ampulla in the left abdomen (series 2/image 15).  Status post cholecystectomy.  Spleen and adrenal glands are unremarkable.  5 mm  nonobstructing left upper pole calculus (series 2/image 11). Right kidney is unremarkable.  No hydronephrosis.  Enteric tube terminates in the proximal duodenum in the left abdomen (series 2/image 23).  Contrast passes into the colon, indicating that the large hernia is nonobstructive.  Colonic diverticulosis, without associated inflammatory changes.  Atherosclerotic calcifications of the abdominal aorta and branch vessels.  Small volume abdominopelvic ascites.  Status post hysterectomy.  No adnexal masses.  Bladder is notable for nondependent gas and an indwelling Foley catheter.  Prior ORIF of the proximal left femur.  Degenerative changes of the visualized thoracolumbar spine.  Mild superior endplate compression deformities at L2 and L5.  IMPRESSION:  Distorted anatomy with very large hiatal hernia  containing stomach, multiple loops of bowel, and pancreas, incompletely visualized.  No evidence of bowel obstruction.  Marked dilatation of the common bile duct with at least two intraductal lesions, as described above, including a 2.7 x 1.9 cm obstructing mass near the ampulla. Moderate to severe intrahepatic ductal dilatation.  Additional ancillary findings as above.  Original Report Authenticated By: Charline Bills, M.D.   Ir Cholan Exist Tube  03/31/2012  *RADIOLOGY REPORT*  Clinical history:71 year old with obstructive jaundice.  The patient currently has an external biliary drain.  PROCEDURE(S): CHOLANGIOGRAM THROUGH EXISTING TUBE; PLACEMENT OF INTERNAL EXTERNAL BILIARY DRAIN  Physician: Rachelle Hora. Henn, MD  Medications:Versed 2 mg IV  Fluoroscopy time: 5.5 minutes  Contrast:  25 ml Omnipaque-300  Procedure:Informed consent was obtained for a cholangiogram and internalization of biliary drain.  The patient was also consented for a possible brush biopsy.  The patient was placed supine on the interventional table.  The existing biliary drain was prepped and draped in a sterile fashion.  Maximal barrier sterile  technique was utilized including caps, mask, sterile gowns, sterile gloves, sterile drape, hand hygiene and skin antiseptic.  Contrast was injected into the biliary drain.  The biliary catheter was cut and removed over a Bentson wire.  5-French Kumpe catheter was used to perform additional cholangiograms.  Catheter was also advanced into the duodenum.  A Bentson wire was placed in the small bowel and a 10-French internal-external biliary drain was placed over a Bentson wire.  The distal aspect of the drain was reconstituted in the small bowel.  Catheter sutured to the skin and attached to a gravity bag.  Findings:The cholangiogram demonstrated an external biliary drain. The patient's anatomy is very unusual in that the common bile duct extends cephalad and is left of midline.  Cholangiogram demonstrated a very large filling defect in the distal common bile duct consistent with a large stone.  There are additional filling defects throughout the biliary system suggestive for additional stones.  The ampulla is widely patent and contrast is now spontaneously draining into the small bowel.  An underlying stricture or lesion was not identified.  Complications: None  Impression:  Successful placement of an internal-external biliary drain.  The cause of the obstructive jaundice is due to biliary stones. There was a very large stone in the distal common bile duct which was mobile throughout the procedure.  Plan:  Plan internal-external biliary drainage until the patient has fully recovered from sepsis and respiratory distress.  Will need GI's opinion about whether ERCP and stone extraction are possible due to the patient's abnormal anatomy.  Otherwise, the patient may be a candidate for percutaneous sphincteroplasty and stone removal.   Original Report Authenticated By: Richarda Overlie, M.D.    Ir Perc Biliary Drain-ext Only  03/27/2012  *RADIOLOGY REPORT*  IR PERCUTANEOUS TRANSHEPATIC CHOLANGIOGRAM AND PERCUTANEOUS BILIARY  DRAIN PLACEMENT  Date: 03/27/2012  Clinical History: 71 year old female with severe sepsis in the setting of obstructed cholangitis. By CT scan, she has an obstructing mass of uncertain etiology in the distal common bile duct.  The left and right intrahepatic ductal system appeared to communicate to.  We will proceed with placement of a left-sided approach percutaneous biliary drain.  If necessary, bilateral biliary drains will be placed.  Procedures Performed: 1. Ultrasound-guided puncture of a peripheral left bile duct 2.  Percutaneous transhepatic cholangiogram 3.  Placement of a percutaneous transhepatic biliary drain  Interventional Radiologist:  Sterling Big, MD  Sedation: Moderate (conscious) sedation was used.  2 mg Versed, 50 mcg Fentanyl were administered intravenously.  The patient's vital signs were monitored continuously by radiology nursing throughout the procedure.  Sedation Time: 53 minutes  Fluoroscopy time: 23.4  Contrast volume: 60 ml Omnipaque-300 administered into the biliary system  PROCEDURE/FINDINGS:   Informed consent was obtained from the patient following explanation of the procedure, risks, benefits and alternatives. The patient understands, agrees and consents for the procedure. All questions were addressed. A time out was performed.  Maximal barrier sterile technique utilized including caps, mask, sterile gowns, sterile gloves, large sterile drape, hand hygiene, and betadine skin prep.  The mid epigastric region to the left of midline was interrogated with ultrasound.  Multiple dilated left sided intrahepatic bile duct were identified.  A peripheral (3rd order) bile duct was identified.  Local anesthesia was achieved with infiltration of 1% lidocaine.  Under direct sonographic guidance, the peripheral biliary radicle was punctured with a 21-gauge micropuncture needle. Images obtained stored for the medical record.  An 0.018-inch wire was advanced into the central bile ducts  followed by the Accustick sheath.  Hand injection of contrast material through the sheath was performed to retain a percutaneous transhepatic cholangiogram.  The patient's anatomy is markedly distorted secondary to her large hiatal hernia and partial herniation of the stomach, small bowel and pancreas into the chest. Her common bile duct is markedly dilated  and ascends toward the left lower chest.  There is diffuse bilateral intrahepatic biliary ductal dilatation. The distal common bile duct is completely obstructed.  A 4-French Glidecatheter and a glide wire were used to navigate through the l left biliary ductal system into the common bile duct. The guide wire was then exchanged for the Amplatz wire.  A 8-French percutaneous biliary drain was then advanced over the wire and positioned in the distal common bile duct just proximal to the obstruction under fluoroscopic guidance.  A sample of green bile was aspirated and sent to the lab for culture.  The tube was then connected to a bag for gravity drainage and secured to the skin using 2-0 Prolene suture.  There is no immediate complication, the patient tolerated the procedure well.  She was returned to the intensive care unit in unchanged condition.  IMPRESSION:  1.  Percutaneous transhepatic cholangiogram demonstrates complete obstruction of the distal common bile duct with marked common bile duct and intrahepatic ductal dilatation.  The obstruction is relatively smooth.  Differential considerations include obstructing neoplasm (given location ampullary, duodenal, pancreatic and less likely cholangiocarcinoma are possible primaries), and potentially benign biliary stricture.  2.  Distorted common bile duct anatomy secondary to large hiatal hernia and upward herniation of the duodenum, pancreas and distal common bile duct.  3.  Successful placement of an 8 [place] external percutaneous transhepatic biliary drain. Maintain drain to gravity drainage for now.  Once the  patient has stabilized, and decompressed she should return interventional radiology for attempted crossing of the obstructing mass/stricture and internalization of the tube.  4. Once stabilized, further evaluation with MRI and MRCP may be useful to evaluate for underlying mass.  Given the patient's difficult anatomy, if ERCP is not possible tissue diagnosis can be obtained by percutaneous transhepatic biopsy at the time of internalization of the tube.  Signed,  Sterling Big, MD Vascular & Interventional Radiologist [facility] Radiology  Original Report Authenticated By: Vilma Prader   Ir Biliary Drain Catheter Placement  03/31/2012  *RADIOLOGY REPORT*  Clinical history:71 year old with obstructive jaundice.  The patient currently has an external  biliary drain.  PROCEDURE(S): CHOLANGIOGRAM THROUGH EXISTING TUBE; PLACEMENT OF INTERNAL EXTERNAL BILIARY DRAIN  Physician: Rachelle Hora. Henn, MD  Medications:Versed 2 mg IV  Fluoroscopy time: 5.5 minutes  Contrast:  25 ml Omnipaque-300  Procedure:Informed consent was obtained for a cholangiogram and internalization of biliary drain.  The patient was also consented for a possible brush biopsy.  The patient was placed supine on the interventional table.  The existing biliary drain was prepped and draped in a sterile fashion.  Maximal barrier sterile technique was utilized including caps, mask, sterile gowns, sterile gloves, sterile drape, hand hygiene and skin antiseptic.  Contrast was injected into the biliary drain.  The biliary catheter was cut and removed over a Bentson wire.  5-French Kumpe catheter was used to perform additional cholangiograms.  Catheter was also advanced into the duodenum.  A Bentson wire was placed in the small bowel and a 10-French internal-external biliary drain was placed over a Bentson wire.  The distal aspect of the drain was reconstituted in the small bowel.  Catheter sutured to the skin and attached to a gravity bag.  Findings:The cholangiogram  demonstrated an external biliary drain. The patient's anatomy is very unusual in that the common bile duct extends cephalad and is left of midline.  Cholangiogram demonstrated a very large filling defect in the distal common bile duct consistent with a large stone.  There are additional filling defects throughout the biliary system suggestive for additional stones.  The ampulla is widely patent and contrast is now spontaneously draining into the small bowel.  An underlying stricture or lesion was not identified.  Complications: None  Impression:  Successful placement of an internal-external biliary drain.  The cause of the obstructive jaundice is due to biliary stones. There was a very large stone in the distal common bile duct which was mobile throughout the procedure.  Plan:  Plan internal-external biliary drainage until the patient has fully recovered from sepsis and respiratory distress.  Will need GI's opinion about whether ERCP and stone extraction are possible due to the patient's abnormal anatomy.  Otherwise, the patient may be a candidate for percutaneous sphincteroplasty and stone removal.   Original Report Authenticated By: Richarda Overlie, M.D.    Ir Ptc  03/27/2012  *RADIOLOGY REPORT*  IR PERCUTANEOUS TRANSHEPATIC CHOLANGIOGRAM AND PERCUTANEOUS BILIARY DRAIN PLACEMENT  Date: 03/27/2012  Clinical History: 71 year old female with severe sepsis in the setting of obstructed cholangitis. By CT scan, she has an obstructing mass of uncertain etiology in the distal common bile duct.  The left and right intrahepatic ductal system appeared to communicate to.  We will proceed with placement of a left-sided approach percutaneous biliary drain.  If necessary, bilateral biliary drains will be placed.  Procedures Performed: 1. Ultrasound-guided puncture of a peripheral left bile duct 2.  Percutaneous transhepatic cholangiogram 3.  Placement of a percutaneous transhepatic biliary drain  Interventional Radiologist:  Sterling Big, MD  Sedation: Moderate (conscious) sedation was used.  2 mg Versed, 50 mcg Fentanyl were administered intravenously.  The patient's vital signs were monitored continuously by radiology nursing throughout the procedure.  Sedation Time: 53 minutes  Fluoroscopy time: 23.4  Contrast volume: 60 ml Omnipaque-300 administered into the biliary system  PROCEDURE/FINDINGS:   Informed consent was obtained from the patient following explanation of the procedure, risks, benefits and alternatives. The patient understands, agrees and consents for the procedure. All questions were addressed. A time out was performed.  Maximal barrier sterile technique utilized including caps, mask, sterile  gowns, sterile gloves, large sterile drape, hand hygiene, and betadine skin prep.  The mid epigastric region to the left of midline was interrogated with ultrasound.  Multiple dilated left sided intrahepatic bile duct were identified.  A peripheral (3rd order) bile duct was identified.  Local anesthesia was achieved with infiltration of 1% lidocaine.  Under direct sonographic guidance, the peripheral biliary radicle was punctured with a 21-gauge micropuncture needle. Images obtained stored for the medical record.  An 0.018-inch wire was advanced into the central bile ducts followed by the Accustick sheath.  Hand injection of contrast material through the sheath was performed to retain a percutaneous transhepatic cholangiogram.  The patient's anatomy is markedly distorted secondary to her large hiatal hernia and partial herniation of the stomach, small bowel and pancreas into the chest. Her common bile duct is markedly dilated  and ascends toward the left lower chest.  There is diffuse bilateral intrahepatic biliary ductal dilatation. The distal common bile duct is completely obstructed.  A 4-French Glidecatheter and a glide wire were used to navigate through the l left biliary ductal system into the common bile duct. The guide wire  was then exchanged for the Amplatz wire.  A 8-French percutaneous biliary drain was then advanced over the wire and positioned in the distal common bile duct just proximal to the obstruction under fluoroscopic guidance.  A sample of green bile was aspirated and sent to the lab for culture.  The tube was then connected to a bag for gravity drainage and secured to the skin using 2-0 Prolene suture.  There is no immediate complication, the patient tolerated the procedure well.  She was returned to the intensive care unit in unchanged condition.  IMPRESSION:  1.  Percutaneous transhepatic cholangiogram demonstrates complete obstruction of the distal common bile duct with marked common bile duct and intrahepatic ductal dilatation.  The obstruction is relatively smooth.  Differential considerations include obstructing neoplasm (given location ampullary, duodenal, pancreatic and less likely cholangiocarcinoma are possible primaries), and potentially benign biliary stricture.  2.  Distorted common bile duct anatomy secondary to large hiatal hernia and upward herniation of the duodenum, pancreas and distal common bile duct.  3.  Successful placement of an 8 [place] external percutaneous transhepatic biliary drain. Maintain drain to gravity drainage for now.  Once the patient has stabilized, and decompressed she should return interventional radiology for attempted crossing of the obstructing mass/stricture and internalization of the tube.  4. Once stabilized, further evaluation with MRI and MRCP may be useful to evaluate for underlying mass.  Given the patient's difficult anatomy, if ERCP is not possible tissue diagnosis can be obtained by percutaneous transhepatic biopsy at the time of internalization of the tube.  Signed,  Sterling Big, MD Vascular & Interventional Radiologist [facility] Radiology  Original Report Authenticated By: Vilma Prader   Ir US Guide Bx Asp/drain  03/27/2012  *RADIOLOGY REPORT*  IR PERCUTANEOUS  TRANSHEPATIC CHOLANGIOGRAM AND PERCUTANEOUS BILIARY DRAIN PLACEMENT  Date: 03/27/2012  Clinical History: 71 year old female with severe sepsis in the setting of obstructed cholangitis. By CT scan, she has an obstructing mass of uncertain etiology in the distal common bile duct.  The left and right intrahepatic ductal system appeared to communicate to.  We will proceed with placement of a left-sided approach percutaneous biliary drain.  If necessary, bilateral biliary drains will be placed.  Procedures Performed: 1. Ultrasound-guided puncture of a peripheral left bile duct 2.  Percutaneous transhepatic cholangiogram 3.  Placement of a percutaneous transhepatic biliary drain  Interventional Radiologist:  Sterling Big, MD  Sedation: Moderate (conscious) sedation was used.  2 mg Versed, 50 mcg Fentanyl were administered intravenously.  The patient's vital signs were monitored continuously by radiology nursing throughout the procedure.  Sedation Time: 53 minutes  Fluoroscopy time: 23.4  Contrast volume: 60 ml Omnipaque-300 administered into the biliary system  PROCEDURE/FINDINGS:   Informed consent was obtained from the patient following explanation of the procedure, risks, benefits and alternatives. The patient understands, agrees and consents for the procedure. All questions were addressed. A time out was performed.  Maximal barrier sterile technique utilized including caps, mask, sterile gowns, sterile gloves, large sterile drape, hand hygiene, and betadine skin prep.  The mid epigastric region to the left of midline was interrogated with ultrasound.  Multiple dilated left sided intrahepatic bile duct were identified.  A peripheral (3rd order) bile duct was identified.  Local anesthesia was achieved with infiltration of 1% lidocaine.  Under direct sonographic guidance, the peripheral biliary radicle was punctured with a 21-gauge micropuncture needle. Images obtained stored for the medical record.  An 0.018-inch  wire was advanced into the central bile ducts followed by the Accustick sheath.  Hand injection of contrast material through the sheath was performed to retain a percutaneous transhepatic cholangiogram.  The patient's anatomy is markedly distorted secondary to her large hiatal hernia and partial herniation of the stomach, small bowel and pancreas into the chest. Her common bile duct is markedly dilated  and ascends toward the left lower chest.  There is diffuse bilateral intrahepatic biliary ductal dilatation. The distal common bile duct is completely obstructed.  A 4-French Glidecatheter and a glide wire were used to navigate through the l left biliary ductal system into the common bile duct. The guide wire was then exchanged for the Amplatz wire.  A 8-French percutaneous biliary drain was then advanced over the wire and positioned in the distal common bile duct just proximal to the obstruction under fluoroscopic guidance.  A sample of green bile was aspirated and sent to the lab for culture.  The tube was then connected to a bag for gravity drainage and secured to the skin using 2-0 Prolene suture.  There is no immediate complication, the patient tolerated the procedure well.  She was returned to the intensive care unit in unchanged condition.  IMPRESSION:  1.  Percutaneous transhepatic cholangiogram demonstrates complete obstruction of the distal common bile duct with marked common bile duct and intrahepatic ductal dilatation.  The obstruction is relatively smooth.  Differential considerations include obstructing neoplasm (given location ampullary, duodenal, pancreatic and less likely cholangiocarcinoma are possible primaries), and potentially benign biliary stricture.  2.  Distorted common bile duct anatomy secondary to large hiatal hernia and upward herniation of the duodenum, pancreas and distal common bile duct.  3.  Successful placement of an 8 [place] external percutaneous transhepatic biliary drain.  Maintain drain to gravity drainage for now.  Once the patient has stabilized, and decompressed she should return interventional radiology for attempted crossing of the obstructing mass/stricture and internalization of the tube.  4. Once stabilized, further evaluation with MRI and MRCP may be useful to evaluate for underlying mass.  Given the patient's difficult anatomy, if ERCP is not possible tissue diagnosis can be obtained by percutaneous transhepatic biopsy at the time of internalization of the tube.  Signed,  Sterling Big, MD Vascular & Interventional Radiologist [facility] Radiology  Original Report Authenticated By: Alvino Blood Chest Port 1 View  04/03/2012  *  RADIOLOGY REPORT*  Clinical Data: Evaluate endotracheal tube.  Increased secretions.  PORTABLE CHEST - 1 VIEW  Comparison: 1 day prior  Findings: Underlying hyperinflation.  Endotracheal tube terminates approximate 1.5 cm above the carina.  Nasogastric tube extends beyond the  inferior aspect of the film.  Right IJ central line terminates at the low SVC.  Underlying hyperinflation.  Patient rotated right.  Mild cardiomegaly.  Layering bilateral pleural effusions are similar. No pneumothorax.  Biapical pleural thickening.  Similar bibasilar airspace disease.  Increased density projecting over the right upper lung.  IMPRESSION:  1.  Similar appearance of small bilateral pleural effusions and bibasilar airspace disease. 2.  Endotracheal tube borderline low in position.  Consider retraction 2 - 3 cm. 3.  Cannot exclude developing right upper lobe airspace disease. Recommend attention on follow-up.   Original Report Authenticated By: Consuello Bossier, M.D.    Dg Chest Port 1 View  04/02/2012  *RADIOLOGY REPORT*  Clinical Data: Check ET tube.  PORTABLE CHEST - 1 VIEW  Comparison: 04/01/2012  Findings: Support devices are in stable position.  Endotracheal tube tip is at the carina and could be retracted slightly. There is hyperinflation of the lungs  compatible with COPD.  Bilateral lower lobe airspace opacities and layering effusions.  Cardiomegaly.  No real change since prior study.  IMPRESSION: No interval change.  Endotracheal tube is near the carina and could be retracted slightly for optimal positioning.   Original Report Authenticated By: Cyndie Chime, M.D.    Dg Chest Port 1 View  04/01/2012  *RADIOLOGY REPORT*  Clinical Data: Evaluate ET tube placement  PORTABLE CHEST - 1 VIEW  Comparison: 03/31/2012  Findings: The endotracheal tube tip is above the carina.  There is a right IJ catheter with tip in the projection of the SVC. Bilateral pleural effusions are unchanged. There is a mild interstitial edema.  Large hiatal hernia is again noted.  IMPRESSION:  1.  No change in position of ET tube. 2.  Persistent pleural effusions and mild interstitial edema.   Original Report Authenticated By: Rosealee Albee, M.D.    Dg Chest Port 1 View  03/31/2012  *RADIOLOGY REPORT*  Clinical Data: Sepsis  PORTABLE CHEST - 1 VIEW  Comparison: 03/29/2012  Findings: Endotracheal tube 2.2 cm above the carina.  Other support apparatus stable.  Bilateral pleural effusions persist with bibasilar airspace disease/consolidation.  Large hiatal hernia noted with left hemidiaphragm elevation as before.  Stable heart size and vascularity.  No pneumothorax.  No significant interval change.  IMPRESSION: Stable portable chest exam   Original Report Authenticated By: Judie Petit. Ruel Favors, M.D.    Dg Chest Port 1 View  03/29/2012  *RADIOLOGY REPORT*  Clinical Data: Endotracheal tube check  PORTABLE CHEST - 1 VIEW  Comparison: Yesterday  Findings: Endotracheal tube tip is 1.7 cm from the carina.  Stable NG tube and epigastric biliary drain.  Right internal jugular vein central venous catheter stable.  Bilateral pleural effusions and bibasilar airspace disease stable.  No pneumothorax.  There is continued shift of the mediastinum to the right worrisome for right lower lobe volume loss.   IMPRESSION: Endotracheal tube 1.7 cm from the carina.  Otherwise stable exam.  Original Report Authenticated By: Donavan Burnet, M.D.   Dg Chest Port 1 View  03/28/2012  *RADIOLOGY REPORT*  Clinical Data: Respiratory difficulty  PORTABLE CHEST - 1 VIEW  Comparison: 03/26/2012  Findings: Cardiomegaly.  Endotracheal tube and right internal jugular vein central venous catheter stable.  NG tube tip is in the body of the stomach.  Epigastric biliary drain placed.  Bilateral pleural effusions and bilateral airspace disease not significantly changed.  No pneumothorax.  IMPRESSION: NG tube placed.  Stable bilateral airspace disease and bilateral pleural effusions.  Original Report Authenticated By: Donavan Burnet, M.D.   Dg Chest Port 1 View  03/26/2012  *RADIOLOGY REPORT*  Clinical Data: Endotracheal tube placement.  Central venous catheter placement.  PORTABLE CHEST - 1 VIEW  Comparison: 03/26/2012.  Findings: Endotracheal tube is 15 mm from the carina.  New right IJ central line is present with its tip one vertebral body below the carina, in the mid-to-lower SVC.  Large bilateral pleural effusions.  Cardiopericardial silhouette mostly obscured by effusions.  Basilar atelectasis.  Pulmonary vascular congestion. Bobby pin projects over the right upper chest.  IMPRESSION:  1.  New endotracheal tube is 15 mm from the carina. 2.  Uncomplicated right IJ central line placement with the tip in the mid SVC. 3.  Bilateral pleural effusions, left greater than right with associated compressive atelectasis.  Probable cardiomegaly with portions of the cardiopericardial silhouette obscured  Original Report Authenticated By: Andreas Newport, M.D.   Dg Chest Port 1 View  03/26/2012  *RADIOLOGY REPORT*  Clinical Data: Chest pressure.  PORTABLE CHEST - 1 VIEW  Comparison: 03/24/2012.  Findings: The cardiopericardial size silhouette is obscured. Surgical clip is present in the lower chest. Bilateral left greater than right pleural  effusions are present, increased from prior exam.  Additionally, there is pulmonary vascular congestion and bilateral lower lobe airspace disease, most compatible with pulmonary edema in the interval since the prior exam.  IMPRESSION: Findings compatible with moderate CHF superimposed on chronic changes.  Bilateral left greater than right pleural effusions.  Original Report Authenticated By: Andreas Newport, M.D.    Scheduled Meds:    . albuterol  2.5 mg Nebulization Q6H  . ampicillin-sulbactam (UNASYN) IV  3 g Intravenous Q6H  . aspirin EC  81 mg Oral Daily  . feeding supplement  1 Container Oral TID BM  . furosemide  20 mg Intravenous Daily  . Gerhardt's butt cream   Topical Daily  . insulin aspart  0-9 Units Subcutaneous TID WC  . ipratropium  0.5 mg Nebulization Q6H  . multivitamin with minerals  1 tablet Oral Daily  . pantoprazole  40 mg Oral Q1200  . potassium chloride  20 mEq Oral Daily  . DISCONTD: piperacillin-tazobactam (ZOSYN)  IV  3.375 g Intravenous Q8H   Continuous Infusions:   Principal Problem:  *Septic shock Active Problems:  Chest pain syndrome  CAD (coronary artery disease)  COPD (chronic obstructive pulmonary disease)  Anxiety  CHF (congestive heart failure)  Vitamin D deficiency disease  Suicidal ideation  UTI (lower urinary tract infection)  Hypotension  Fever  Acute respiratory failure with hypoxia  Acute cholangitis        [facility]  [facility] Pager [phone]. If 8PM-8AM, please contact night-coverage at [email or web address], password [facility] 04/06/2012, 7:26 PM  LOS: 13 days

## 2012-04-06 NOTE — Progress Notes (Signed)
Observed scan amounts of thick red-tinged sputum this morning on suction catheter. Discussed with surgical consult MD who states this is expected due to procedure pt had previously. Janellie Tennison, Melida Quitter

## 2012-04-06 NOTE — Progress Notes (Signed)
Physical Therapy Treatment Patient Details Name: Laurie Sparks MRN: [id number] DOB: [birth year] Today's Date: 04/06/2012 Time: 4098-1191 PT Time Calculation (min): 24 min  PT Assessment / Plan / Recommendation Comments on Treatment Session  pt present with Biliary Obstruction, Resp Failure, and Septic Shock.  pt very motivated to improve strength and mobility.  pt fatigues very quickly.      Follow Up Recommendations  Skilled nursing facility    Barriers to Discharge        Equipment Recommendations  Rolling walker with 5" wheels;3 in 1 bedside comode    Recommendations for Other Services    Frequency Min 3X/week   Plan Discharge plan remains appropriate;Frequency remains appropriate    Precautions / Restrictions Precautions Precautions: Fall Restrictions Weight Bearing Restrictions: No   Pertinent Vitals/Pain Indicates stomach all over tender, but tolerable.      Mobility  Bed Mobility Bed Mobility: Supine to Sit;Sitting - Scoot to Edge of Bed Supine to Sit: 5: Supervision;HOB flat Sitting - Scoot to Edge of Bed: 5: Supervision Sit to Supine: 5: Supervision;HOB flat Details for Bed Mobility Assistance: only v/c for drain placement during transfer needed Transfers Transfers: Sit to Stand;Stand to Sit Sit to Stand: 5: Supervision;With upper extremity assist;From bed Stand to Sit: 5: Supervision;With upper extremity assist;To chair/3-in-1 Details for Transfer Assistance: demos good use of UEs Ambulation/Gait Ambulation/Gait Assistance: 4: Min guard Ambulation Distance (Feet): 30 Feet Assistive device: Rolling walker Ambulation/Gait Assistance Details: cues for upright posture, positioning within RW, safety with turns Gait Pattern: Step-through pattern;Decreased stride length;Shuffle;Trunk flexed Stairs: No Wheelchair Mobility Wheelchair Mobility: No    Exercises     PT Diagnosis:    PT Problem List:   PT Treatment Interventions:     PT Goals Acute Rehab PT  Goals Time For Goal Achievement: 04/19/12 PT Goal: Supine/Side to Sit - Progress: Progressing toward goal PT Goal: Sit to Supine/Side - Progress: Progressing toward goal Pt will go Sit to Stand: with modified independence PT Goal: Sit to Stand - Progress: Updated due to goal met Pt will go Stand to Sit: with modified independence PT Goal: Stand to Sit - Progress: Updated due to goals met PT Goal: Ambulate - Progress: Progressing toward goal  Visit Information  Last PT Received On: 04/06/12 Assistance Needed: +1 PT/OT Co-Evaluation/Treatment: Yes    Subjective Data  Subjective: MY stomach is just tender all over.     Cognition  Overall Cognitive Status: Appears within functional limits for tasks assessed/performed Arousal/Alertness: Awake/alert Orientation Level: Appears intact for tasks assessed Behavior During Session: [facility] for tasks performed    Balance  Balance Balance Assessed: No  End of Session PT - End of Session Equipment Utilized During Treatment: Gait belt;Oxygen Activity Tolerance: Patient limited by fatigue Patient left: in chair;with call bell/phone within reach Nurse Communication: Mobility status   GP     Sunny Schlein, [place] [phone] 04/06/2012, 10:52 AM

## 2012-04-06 NOTE — Consult Note (Signed)
Primary CBD stones.  GI to evaluate for ERCP.  If not feasible IR has offered to approach from above.  Surgery problematic due to large HH distorting anatomy. Patient examined and I agree with the assessment and plan  Violeta Gelinas, MD, MPH, FACS Pager: [phone]  04/06/2012 12:15 PM

## 2012-04-06 NOTE — Progress Notes (Signed)
Subjective: Pt ok. Looking stronger.  Objective: Physical Exam: BP 100/64  Pulse 97  Temp 98.2 F (36.8 C) (Oral)  Resp 18  Ht 5\' 1"  (1.549 m)  Wt 94 lb 12.8 oz (43 kg)  BMI 17.91 kg/m2  SpO2 97% Drain intact, site clean, no redness. Good bilious output with scattered sludge/stone particles.   Labs: CBC  Basename 04/06/12 0511 04/05/12 0612  WBC 8.0 11.9*  HGB 8.9* 9.5*  HCT 28.3* 30.1*  PLT 306 291   BMET  Basename 04/06/12 0511 04/05/12 0612  NA 137 139  K 3.5 4.4  CL 98 100  CO2 31 33*  GLUCOSE 96 105*  BUN 14 16  CREATININE 0.44* 0.49*  CALCIUM 8.7 9.1   LFT No results found for this basename: PROT,ALBUMIN,AST,ALT,ALKPHOS,BILITOT,BILIDIR,IBILI,LIPASE in the last 72 hours PT/INR No results found for this basename: LABPROT:2,INR:2 in the last 72 hours   Studies/Results: No results found.  Assessment/Plan: s/p PTC/ bili drain placed 8/16  Plan for stone extraction as OP, Plan 2 weeks or more in IR   LOS: 13 days    Brayton El PA-C 04/06/2012 11:03 AM

## 2012-04-06 NOTE — Progress Notes (Signed)
CSW received referral from colleague Psych CSW for snf placement for short term rehab. CSW met with pt and pt son to discuss pt discharge plans. Pt is open to snf in [employer]. Please see placement note for progress. .Clinical social worker continuing to follow pt to assist with pt dc plans and further csw needs.   Catha Gosselin, Theresia Majors  [phone] .04/06/2012 1654pm

## 2012-04-07 ENCOUNTER — Other Ambulatory Visit: Payer: Self-pay | Admitting: Physician Assistant

## 2012-04-07 DIAGNOSIS — F411 Generalized anxiety disorder: Secondary | ICD-10-CM

## 2012-04-07 LAB — GLUCOSE, CAPILLARY
Glucose-Capillary: 155 mg/dL — ABNORMAL HIGH (ref 70–99)
Glucose-Capillary: 109 mg/dL — ABNORMAL HIGH (ref 70–99)

## 2012-04-07 MED ORDER — AMOXICILLIN-POT CLAVULANATE 875-125 MG PO TABS: 1.0000 | ORAL_TABLET | Freq: Two times a day (BID) | ORAL | Status: DC

## 2012-04-07 MED ORDER — IPRATROPIUM BROMIDE 0.02 % IN SOLN
0.5000 mg | Freq: Three times a day (TID) | RESPIRATORY_TRACT | Status: DC
  Administered 2012-04-07 – 2012-04-08 (×3): 0.5 mg via RESPIRATORY_TRACT
  Filled 2012-04-07 (×3): qty 2.5

## 2012-04-07 MED ORDER — ALBUTEROL SULFATE (5 MG/ML) 0.5% IN NEBU: 2.5000 mg | INHALATION_SOLUTION | Freq: Three times a day (TID) | RESPIRATORY_TRACT | Status: DC | PRN

## 2012-04-07 MED ORDER — ALUM & MAG HYDROXIDE-SIMETH 200-200-20 MG/5ML PO SUSP: 15.0000 mL | ORAL | Status: AC | PRN

## 2012-04-07 MED ORDER — GERHARDT'S BUTT CREAM: 1.0000 "application " | TOPICAL_CREAM | Freq: Every day | CUTANEOUS | Status: DC

## 2012-04-07 MED ORDER — ADULT MULTIVITAMIN W/MINERALS CH: 1.0000 | ORAL_TABLET | Freq: Every day | ORAL | Status: AC

## 2012-04-07 MED ORDER — HYDROCODONE-ACETAMINOPHEN 10-325 MG PO TABS: 2.0000 | ORAL_TABLET | ORAL | Status: AC | PRN

## 2012-04-07 MED ORDER — HYDROCODONE-ACETAMINOPHEN 7.5-325 MG PO TABS: 1.0000 | ORAL_TABLET | Freq: Three times a day (TID) | ORAL | Status: DC

## 2012-04-07 MED ORDER — BOOST / RESOURCE BREEZE PO LIQD: 1.0000 | Freq: Three times a day (TID) | ORAL | Status: DC

## 2012-04-07 MED ORDER — ALBUTEROL SULFATE (5 MG/ML) 0.5% IN NEBU
2.5000 mg | INHALATION_SOLUTION | Freq: Three times a day (TID) | RESPIRATORY_TRACT | Status: DC
  Administered 2012-04-07 – 2012-04-08 (×3): 2.5 mg via RESPIRATORY_TRACT
  Filled 2012-04-07 (×3): qty 0.5

## 2012-04-07 MED ORDER — IPRATROPIUM BROMIDE 0.02 % IN SOLN: 0.5000 mg | Freq: Three times a day (TID) | RESPIRATORY_TRACT | Status: AC | PRN

## 2012-04-07 MED ORDER — SODIUM CHLORIDE 0.9 % IV SOLN: 3.0000 g | Freq: Four times a day (QID) | INTRAVENOUS | Status: DC

## 2012-04-07 NOTE — Progress Notes (Signed)
CSW received [facility] number for patient. Per discussion with pt MD pt may be ready for dc. CSW left message for [facility] to discuss pt dc today.   Catha Gosselin, Theresia Majors  [phone] .04/07/2012 1429pm

## 2012-04-07 NOTE — Progress Notes (Signed)
Patient ID: Laurie Sparks, female   DOB: Jan 15, 1941, 71 y.o.   MRN: [id number]  HPI :  Primary CBD stones with obstructive jaundice, cholangitis, septic shock s/p internal / external biliary drain placement by IR on 03/27/12.    Notes reviewed on future plan for this patient. ID to be consulted for antibiotic duration recommendation. IR has planned for sphincterotomy via internal / external biliary drain access with Dr. Maryclare Bean on Friday April 24, 2012 at 0900.   This has been placed in the discharge section of her chart with contact numbers and instructions. IR to continue to follow her while IP to make sure no adjustments are made in her plans prior to discharge.  Blood pressure 123/67, pulse 96, temperature 98.6 F (37 C), temperature source Oral, resp. rate 18, height 5\' 1"  (1.549 m), weight 94 lb 12.8 oz (43 kg), SpO2 95.00%.  Drain intact with output last 24 hours greater than 1 L with last labs of 8/22 revealing stable bilirubin.    Results for Laurie Sparks (MRN [id number]) as of 04/07/2012 10:25  Ref. Range 04/02/2012 04:14  Bilirubin, Direct Latest Range: 0.0-0.3 mg/dL 0.5 (H)    A/P : CBD stone causing obstruction s/p internal /external biliary drain placement. Bilirubin has returned to normal limits. To continue OP antibiotics per ID recommendations and to follow up as an OP with IR for sphincterotomy procedure via drainage access with Dr. Bonnielee Haff 9/13. Discharge information placed in chart for patient to receive upon discharge.

## 2012-04-07 NOTE — Progress Notes (Signed)
.  Clinical social worker assisted with patient discharge to skilled nursing facility, [place]. .Patient transportation provided by [employer] [facility] with patient chart copy. Pt transportation scheduled for 4pm as agreed upon pt, pt son, rn, and facility. .No further Clinical Social Work needs, signing off. Marland Kitchen  Catha Gosselin, Theresia Majors  [phone] .04/07/2012 1528pm

## 2012-04-07 NOTE — Progress Notes (Signed)
Progress note following consultation for suicidal ideation. Major computer dysfunction delay for all notes  Patient has recovered from septic shock, cholangitis and has been doing well since extubation. She is seeing in bed with her son present. She has good eye contact, is smiling and says she is feeling well and sleeps well. Her voice is steady and she denies any suicidal thoughts. She has been told that they are trying to find skilled nursing facility for her.   She accepts this plan with the anticipation of returning to her home. Her son explains that his brother lives with him and her daughter lives with her in the big house. They are on the same property about [place] half mile away.  Diagnosis Axis I  Maj. depressive disorder, recurrent, severe with suicidal ideation., Generalized anxiety disorder. Axis II: Deferred Axis IV  consumed by worry over her children are financial distress and is social problems.              Undernourished by choice or by complication of all her medical conditions Axis V GAF 60 RECOMMENDATION: 1.  Patient has capacity and agrees to skilled nursing facility 2.  Suggest minimizing benzodiazepines and consider a hypnotic sedatives Ambien 5 mg at bedtime or trazodone 50 mg at bedtime for sleep 3.  No further psychiatric needs unless requested M.D. Psychiatrist signs off Servando Kyllonen J. Ferol Luz, MD Psychiatrist  04/07/2012 12:55 AM

## 2012-04-07 NOTE — Progress Notes (Signed)
Per discussion with pt RN, pt awaiting picc line. Per discussion with pt RN, IV team can not confirm time of picc line to be placed if at all tonight. CSW informed [facility] regarding pt delay in discharge. CSW also informed pt son. RN to inform pt. Pt dc anticipated for tomorrow.   .Clinical social worker continuing to follow pt to assist with pt dc plans and further csw needs.   Catha Gosselin, Theresia Majors  [phone] .04/07/2012 1656pm

## 2012-04-07 NOTE — Discharge Summary (Signed)
Physician Discharge Summary  Laurie Sparks ZOX:[id number] DOB: November 12, 1940 DOA: 03/24/2012  PCP: Warrick Parisian, MD  Admit date: 03/24/2012 Discharge date: 04/07/2012  Recommendations for Outpatient Follow-up:   Follow up with IR WITH DR HOSS ON 04/24/12 for the procedure., AND FOLLOW UP WITH [facility] before the antibiotics are done. Please use unasyn till august 3rd and  Please start augmentin on august 3rd , after the unasyn is finished and see Dr Daiva Eves in [facility] before the antibiotics are finished.  Discharge Diagnoses:  Principal Problem:  *Septic shock Active Problems:  Chest pain syndrome  CAD (coronary artery disease)  COPD (chronic obstructive pulmonary disease)  Anxiety  CHF (congestive heart failure)  Vitamin D deficiency disease  Suicidal ideation  UTI (lower urinary tract infection)  Hypotension  Fever  Acute respiratory failure with hypoxia  Acute cholangitis   Discharge Condition: stable  Diet recommendation: ; low salt diet  Filed Weights   04/02/12 0500 04/03/12 0500 04/04/12 0500  Weight: 49.1 kg (108 lb 3.9 oz) 45.8 kg (100 lb 15.5 oz) 43 kg (94 lb 12.8 oz)    History of present illness:   71 y/o female with prior cholecystectomy, CHF and CAD admitted 8/14 with chest pain developed hypotension, tachycardia and worsening hypoxemic respiratory failure likely in the setting of sepsis likely due to cholangitis vs. Less likely SBO/abdominal process. Final dx: Primary CBD stones with obstructive jaundice, cholangitis, septic shock  Hospital Course:  1. Acute on chronic hypoxemic respiratory failure due to pulmonary edema, effusions; COPD on 3 L O2 at home  Resolved resp failure 8/23. On 2 lit nasal canula oxygen.   2.Septic Shock: resolved. Leukocytosis resolved. currently on Unasyn to complete the course till sep 3rd, and she can be started on Augmentin till the patient has the procedure on 04/24/12.   3.Oliguria and Hyponatremia :  resolved.   4. Elevated transaminases and alk phos, improving,due to cholangitis (hx chole), CBD obstruction due to Primary Gall stone. Anatomy complicated by huge hiatal hernia. HX of multiple lap In past  -8/16 external biliary drain  - 817 Started tube feeds  - on 03/31/12: IR procedure - gall stones blocking CBD. No evidence of malignancy. S/p internal external biliary drain.  - 8/21, GI felt ERCP sphincterotomy too anatomically difficult because majority of stomach in chest (herniation and stone too big). REcommending IR ampullary sphincterotomy via IR - 8/22 - GI and [facility] opinion: procedure to remove primary bile duct not technically feasible. So, IR plans sphincterotomy through int-external biliary drain in 2 weeks from 04/02/12  ID consult obtained for the duration of antibiotics, recommendations are to continue with Unasyn till sep 3rd and start augmentin after that till the procedure. And plan to see Dr Zenaida Niece dam in the office before the antibiotics are done.   5. Suicidal Ideation and Depression: currently she does not have suicidal ideations. She is motivated to get better. Her pain is better. Psychiatry consult called, recommended to start her on Ambien at night as needed and to avoid benzodiazepines.   6.Thrombocytopenia - likely sepsis related (not on heparin, not in DIC) resolved. Platelets are normal now.  7. Anemia of critical illness; stable between 8 to 9.    Procedures:  External Biliary Drain  Consultations:  Surgery  [facility]  IR  PSYCHIATRY  PCCM  Discharge Exam: Filed Vitals:   04/07/12 1320  BP: 112/67  Pulse: 104  Temp: 98.3 F (36.8 C)  Resp: 16   Filed Vitals:  04/07/12 0145 04/07/12 0500 04/07/12 0914 04/07/12 1320  BP:  123/67  112/67  Pulse:  96  104  Temp:  98.6 F (37 C)  98.3 F (36.8 C)  TempSrc:  Oral  Oral  Resp:  18  16  Height:      Weight:      SpO2: 99% 99% 95% 97%   Gen: chronically ill and critically ill appearing, on RA    PULM: clear  CVS rrr, no mgr,  AB: BS infrequent, soft, less tender , external drain present.  Ext: warm, no edema, no clubbing, no cyanosis  Derm: no rash or skin breakdown per RN  Neuro: awake and alert   Discharge Instructions  Discharge Orders    Future Appointments: Provider: Department: Dept Phone: Center:   04/22/2012 4:30 PM Randall Hiss, MD [facility] [phone] [facility]   04/24/2012 9:00 AM [facility] [phone] [facility]     Future Orders Please Complete By Expires   Diet - low sodium heart healthy      Discharge instructions      Comments:   Follow up with IR WITH DR HOSS ON 04/24/12 for the procedure., AND FOLLOW UP WITH [facility] before the antibiotics are done. Please use unasyn till august 3rd and  Please start augmentin on august 4 th, after the [facility] is finished and see Dr Daiva Eves in [facility] before the antibiotics are finished.   Activity as tolerated - No restrictions        Medication List  As of 04/07/2012  2:37 PM   TAKE these medications         albuterol (5 MG/ML) 0.5% nebulizer solution   Commonly known as: PROVENTIL   Take 0.5 mLs (2.5 mg total) by nebulization 3 (three) times daily as needed for wheezing.      alum & mag hydroxide-simeth 200-200-20 MG/5ML suspension   Commonly known as: MAALOX/MYLANTA   Take 15 mLs by mouth every 4 (four) hours as needed.      amoxicillin-clavulanate 875-125 MG per tablet   Commonly known as: AUGMENTIN   Take 1 tablet by mouth 2 (two) times daily.   Start taking on: 04/15/2012      aspirin EC 81 MG tablet   Take 81 mg by mouth daily.      carvedilol 12.5 MG tablet   Commonly known as: COREG   Take 12.5 mg by mouth 2 (two) times daily with a meal.      feeding supplement Liqd   Take 1 Container by mouth 3 (three) times daily between meals.      Gerhardt's butt cream Crea   Apply 1 application topically daily.      HYDROcodone-acetaminophen 7.5-325 MG per tablet    Commonly known as: NORCO   Take 1 tablet by mouth 3 (three) times daily.      ipratropium 0.02 % nebulizer solution   Commonly known as: ATROVENT   Take 2.5 mLs (0.5 mg total) by nebulization 3 (three) times daily as needed for wheezing.      multivitamin with minerals Tabs   Take 1 tablet by mouth daily.      nitroGLYCERIN 0.4 MG SL tablet   Commonly known as: NITROSTAT   Place 0.4 mg under the tongue every 5 (five) minutes as needed. For chest pain      sodium chloride 0.9 % SOLN 100 mL with Ampicillin-Sulbactam 3 (2-1) G SOLR 3 g  Inject 3 g into the vein every 6 (six) hours.           Follow-up Information    Call Department of Social Services. (Call or go to office to inquire about food stamps and Medicaid)    Contact information:   [address] [place], [place] [phone]      Follow up with [facility] on 04/24/2012. (arrive to short stay at 0730. Nothing to eat after midnight night before. Need driver. Okay to take meds with sips of water )    Contact information:   Dr. Lenoria Farrier Hoss [address] [place] [address] [phone]          The results of significant diagnostics from this hospitalization (including imaging, microbiology, ancillary and laboratory) are listed below for reference.    Significant Diagnostic Studies: Dg Chest 2 View  03/24/2012  *RADIOLOGY REPORT*  Clinical Data: Mid and left chest pain.  CHEST - 2 VIEW  Comparison: PA and lateral chest 07/26/2010.  Findings: Marked elevation of the left hemidiaphragm is again seen. The chest is hyperexpanded.  Blunting of the right costophrenic angle is unchanged.  Heart size is upper normal.  IMPRESSION: No acute finding.  Stable compared to prior exam.  Original Report Authenticated By: Bernadene Bell. Maricela Curet, M.D.   Ct Abdomen Pelvis W Contrast  03/26/2012  *RADIOLOGY REPORT*  Clinical Data: Abdominal pain, septic shock, nausea/vomiting  CT ABDOMEN AND PELVIS WITH CONTRAST   Technique:  Multidetector CT imaging of the abdomen and pelvis was performed following the standard protocol during bolus administration of intravenous contrast.  Contrast: 80mL OMNIPAQUE IOHEXOL 300 MG/ML  SOLN  Comparison: None.  Findings: Very large hiatal hernia, incompletely visualized, containing stomach to the right of midline (series 2/image 7), multiple loops of opacified and nonopacified bowel, and pancreas centrally (series 2/image 6).  Liver is notable for moderate to severe central intrahepatic ductal dilatation.  Common bile duct is markedly dilated with a 1.5 x 1.0 cm abnormal soft tissue lesion centrally (series 2/image 22) and a 2.7 x 1.9 cm obstructing mass distally near the ampulla in the left abdomen (series 2/image 15).  Status post cholecystectomy.  Spleen and adrenal glands are unremarkable.  5 mm nonobstructing left upper pole calculus (series 2/image 11). Right kidney is unremarkable.  No hydronephrosis.  Enteric tube terminates in the proximal duodenum in the left abdomen (series 2/image 23).  Contrast passes into the colon, indicating that the large hernia is nonobstructive.  Colonic diverticulosis, without associated inflammatory changes.  Atherosclerotic calcifications of the abdominal aorta and branch vessels.  Small volume abdominopelvic ascites.  Status post hysterectomy.  No adnexal masses.  Bladder is notable for nondependent gas and an indwelling Foley catheter.  Prior ORIF of the proximal left femur.  Degenerative changes of the visualized thoracolumbar spine.  Mild superior endplate compression deformities at L2 and L5.  IMPRESSION:  Distorted anatomy with very large hiatal hernia containing stomach, multiple loops of bowel, and pancreas, incompletely visualized.  No evidence of bowel obstruction.  Marked dilatation of the common bile duct with at least two intraductal lesions, as described above, including a 2.7 x 1.9 cm obstructing mass near the ampulla. Moderate to severe  intrahepatic ductal dilatation.  Additional ancillary findings as above.  Original Report Authenticated By: Charline Bills, M.D.   Ir Cholan Exist Tube  03/31/2012  *RADIOLOGY REPORT*  Clinical history:71 year old with obstructive jaundice.  The patient currently has an external biliary drain.  PROCEDURE(S): CHOLANGIOGRAM THROUGH EXISTING  TUBE; PLACEMENT OF INTERNAL EXTERNAL BILIARY DRAIN  Physician: Rachelle Hora. Henn, MD  Medications:Versed 2 mg IV  Fluoroscopy time: 5.5 minutes  Contrast:  25 ml Omnipaque-300  Procedure:Informed consent was obtained for a cholangiogram and internalization of biliary drain.  The patient was also consented for a possible brush biopsy.  The patient was placed supine on the interventional table.  The existing biliary drain was prepped and draped in a sterile fashion.  Maximal barrier sterile technique was utilized including caps, mask, sterile gowns, sterile gloves, sterile drape, hand hygiene and skin antiseptic.  Contrast was injected into the biliary drain.  The biliary catheter was cut and removed over a Bentson wire.  5-French Kumpe catheter was used to perform additional cholangiograms.  Catheter was also advanced into the duodenum.  A Bentson wire was placed in the small bowel and a 10-French internal-external biliary drain was placed over a Bentson wire.  The distal aspect of the drain was reconstituted in the small bowel.  Catheter sutured to the skin and attached to a gravity bag.  Findings:The cholangiogram demonstrated an external biliary drain. The patient's anatomy is very unusual in that the common bile duct extends cephalad and is left of midline.  Cholangiogram demonstrated a very large filling defect in the distal common bile duct consistent with a large stone.  There are additional filling defects throughout the biliary system suggestive for additional stones.  The ampulla is widely patent and contrast is now spontaneously draining into the small bowel.  An  underlying stricture or lesion was not identified.  Complications: None  Impression:  Successful placement of an internal-external biliary drain.  The cause of the obstructive jaundice is due to biliary stones. There was a very large stone in the distal common bile duct which was mobile throughout the procedure.  Plan:  Plan internal-external biliary drainage until the patient has fully recovered from sepsis and respiratory distress.  Will need GI's opinion about whether ERCP and stone extraction are possible due to the patient's abnormal anatomy.  Otherwise, the patient may be a candidate for percutaneous sphincteroplasty and stone removal.   Original Report Authenticated By: Richarda Overlie, M.D.    Ir Perc Biliary Drain-ext Only  03/27/2012  *RADIOLOGY REPORT*  IR PERCUTANEOUS TRANSHEPATIC CHOLANGIOGRAM AND PERCUTANEOUS BILIARY DRAIN PLACEMENT  Date: 03/27/2012  Clinical History: 71 year old female with severe sepsis in the setting of obstructed cholangitis. By CT scan, she has an obstructing mass of uncertain etiology in the distal common bile duct.  The left and right intrahepatic ductal system appeared to communicate to.  We will proceed with placement of a left-sided approach percutaneous biliary drain.  If necessary, bilateral biliary drains will be placed.  Procedures Performed: 1. Ultrasound-guided puncture of a peripheral left bile duct 2.  Percutaneous transhepatic cholangiogram 3.  Placement of a percutaneous transhepatic biliary drain  Interventional Radiologist:  Sterling Big, MD  Sedation: Moderate (conscious) sedation was used.  2 mg Versed, 50 mcg Fentanyl were administered intravenously.  The patient's vital signs were monitored continuously by radiology nursing throughout the procedure.  Sedation Time: 53 minutes  Fluoroscopy time: 23.4  Contrast volume: 60 ml Omnipaque-300 administered into the biliary system  PROCEDURE/FINDINGS:   Informed consent was obtained from the patient following  explanation of the procedure, risks, benefits and alternatives. The patient understands, agrees and consents for the procedure. All questions were addressed. A time out was performed.  Maximal barrier sterile technique utilized including caps, mask, sterile gowns, sterile gloves, large  sterile drape, hand hygiene, and betadine skin prep.  The mid epigastric region to the left of midline was interrogated with ultrasound.  Multiple dilated left sided intrahepatic bile duct were identified.  A peripheral (3rd order) bile duct was identified.  Local anesthesia was achieved with infiltration of 1% lidocaine.  Under direct sonographic guidance, the peripheral biliary radicle was punctured with a 21-gauge micropuncture needle. Images obtained stored for the medical record.  An 0.018-inch wire was advanced into the central bile ducts followed by the Accustick sheath.  Hand injection of contrast material through the sheath was performed to retain a percutaneous transhepatic cholangiogram.  The patient's anatomy is markedly distorted secondary to her large hiatal hernia and partial herniation of the stomach, small bowel and pancreas into the chest. Her common bile duct is markedly dilated  and ascends toward the left lower chest.  There is diffuse bilateral intrahepatic biliary ductal dilatation. The distal common bile duct is completely obstructed.  A 4-French Glidecatheter and a glide wire were used to navigate through the l left biliary ductal system into the common bile duct. The guide wire was then exchanged for the Amplatz wire.  A 8-French percutaneous biliary drain was then advanced over the wire and positioned in the distal common bile duct just proximal to the obstruction under fluoroscopic guidance.  A sample of green bile was aspirated and sent to the lab for culture.  The tube was then connected to a bag for gravity drainage and secured to the skin using 2-0 Prolene suture.  There is no immediate complication,  the patient tolerated the procedure well.  She was returned to the intensive care unit in unchanged condition.  IMPRESSION:  1.  Percutaneous transhepatic cholangiogram demonstrates complete obstruction of the distal common bile duct with marked common bile duct and intrahepatic ductal dilatation.  The obstruction is relatively smooth.  Differential considerations include obstructing neoplasm (given location ampullary, duodenal, pancreatic and less likely cholangiocarcinoma are possible primaries), and potentially benign biliary stricture.  2.  Distorted common bile duct anatomy secondary to large hiatal hernia and upward herniation of the duodenum, pancreas and distal common bile duct.  3.  Successful placement of an 8 [place] external percutaneous transhepatic biliary drain. Maintain drain to gravity drainage for now.  Once the patient has stabilized, and decompressed she should return interventional radiology for attempted crossing of the obstructing mass/stricture and internalization of the tube.  4. Once stabilized, further evaluation with MRI and MRCP may be useful to evaluate for underlying mass.  Given the patient's difficult anatomy, if ERCP is not possible tissue diagnosis can be obtained by percutaneous transhepatic biopsy at the time of internalization of the tube.  Signed,  Sterling Big, MD Vascular & Interventional Radiologist [facility] Radiology  Original Report Authenticated By: Vilma Prader   Ir Biliary Drain Catheter Placement  03/31/2012  *RADIOLOGY REPORT*  Clinical history:71 year old with obstructive jaundice.  The patient currently has an external biliary drain.  PROCEDURE(S): CHOLANGIOGRAM THROUGH EXISTING TUBE; PLACEMENT OF INTERNAL EXTERNAL BILIARY DRAIN  Physician: Rachelle Hora. Henn, MD  Medications:Versed 2 mg IV  Fluoroscopy time: 5.5 minutes  Contrast:  25 ml Omnipaque-300  Procedure:Informed consent was obtained for a cholangiogram and internalization of biliary drain.  The patient was  also consented for a possible brush biopsy.  The patient was placed supine on the interventional table.  The existing biliary drain was prepped and draped in a sterile fashion.  Maximal barrier sterile technique was utilized including caps, mask, sterile gowns,  sterile gloves, sterile drape, hand hygiene and skin antiseptic.  Contrast was injected into the biliary drain.  The biliary catheter was cut and removed over a Bentson wire.  5-French Kumpe catheter was used to perform additional cholangiograms.  Catheter was also advanced into the duodenum.  A Bentson wire was placed in the small bowel and a 10-French internal-external biliary drain was placed over a Bentson wire.  The distal aspect of the drain was reconstituted in the small bowel.  Catheter sutured to the skin and attached to a gravity bag.  Findings:The cholangiogram demonstrated an external biliary drain. The patient's anatomy is very unusual in that the common bile duct extends cephalad and is left of midline.  Cholangiogram demonstrated a very large filling defect in the distal common bile duct consistent with a large stone.  There are additional filling defects throughout the biliary system suggestive for additional stones.  The ampulla is widely patent and contrast is now spontaneously draining into the small bowel.  An underlying stricture or lesion was not identified.  Complications: None  Impression:  Successful placement of an internal-external biliary drain.  The cause of the obstructive jaundice is due to biliary stones. There was a very large stone in the distal common bile duct which was mobile throughout the procedure.  Plan:  Plan internal-external biliary drainage until the patient has fully recovered from sepsis and respiratory distress.  Will need GI's opinion about whether ERCP and stone extraction are possible due to the patient's abnormal anatomy.  Otherwise, the patient may be a candidate for percutaneous sphincteroplasty and stone  removal.   Original Report Authenticated By: Richarda Overlie, M.D.    Ir Ptc  03/27/2012  *RADIOLOGY REPORT*  IR PERCUTANEOUS TRANSHEPATIC CHOLANGIOGRAM AND PERCUTANEOUS BILIARY DRAIN PLACEMENT  Date: 03/27/2012  Clinical History: 71 year old female with severe sepsis in the setting of obstructed cholangitis. By CT scan, she has an obstructing mass of uncertain etiology in the distal common bile duct.  The left and right intrahepatic ductal system appeared to communicate to.  We will proceed with placement of a left-sided approach percutaneous biliary drain.  If necessary, bilateral biliary drains will be placed.  Procedures Performed: 1. Ultrasound-guided puncture of a peripheral left bile duct 2.  Percutaneous transhepatic cholangiogram 3.  Placement of a percutaneous transhepatic biliary drain  Interventional Radiologist:  Sterling Big, MD  Sedation: Moderate (conscious) sedation was used.  2 mg Versed, 50 mcg Fentanyl were administered intravenously.  The patient's vital signs were monitored continuously by radiology nursing throughout the procedure.  Sedation Time: 53 minutes  Fluoroscopy time: 23.4  Contrast volume: 60 ml Omnipaque-300 administered into the biliary system  PROCEDURE/FINDINGS:   Informed consent was obtained from the patient following explanation of the procedure, risks, benefits and alternatives. The patient understands, agrees and consents for the procedure. All questions were addressed. A time out was performed.  Maximal barrier sterile technique utilized including caps, mask, sterile gowns, sterile gloves, large sterile drape, hand hygiene, and betadine skin prep.  The mid epigastric region to the left of midline was interrogated with ultrasound.  Multiple dilated left sided intrahepatic bile duct were identified.  A peripheral (3rd order) bile duct was identified.  Local anesthesia was achieved with infiltration of 1% lidocaine.  Under direct sonographic guidance, the peripheral  biliary radicle was punctured with a 21-gauge micropuncture needle. Images obtained stored for the medical record.  An 0.018-inch wire was advanced into the central bile ducts followed by the Accustick sheath.  Hand injection of contrast material through the sheath was performed to retain a percutaneous transhepatic cholangiogram.  The patient's anatomy is markedly distorted secondary to her large hiatal hernia and partial herniation of the stomach, small bowel and pancreas into the chest. Her common bile duct is markedly dilated  and ascends toward the left lower chest.  There is diffuse bilateral intrahepatic biliary ductal dilatation. The distal common bile duct is completely obstructed.  A 4-French Glidecatheter and a glide wire were used to navigate through the l left biliary ductal system into the common bile duct. The guide wire was then exchanged for the Amplatz wire.  A 8-French percutaneous biliary drain was then advanced over the wire and positioned in the distal common bile duct just proximal to the obstruction under fluoroscopic guidance.  A sample of green bile was aspirated and sent to the lab for culture.  The tube was then connected to a bag for gravity drainage and secured to the skin using 2-0 Prolene suture.  There is no immediate complication, the patient tolerated the procedure well.  She was returned to the intensive care unit in unchanged condition.  IMPRESSION:  1.  Percutaneous transhepatic cholangiogram demonstrates complete obstruction of the distal common bile duct with marked common bile duct and intrahepatic ductal dilatation.  The obstruction is relatively smooth.  Differential considerations include obstructing neoplasm (given location ampullary, duodenal, pancreatic and less likely cholangiocarcinoma are possible primaries), and potentially benign biliary stricture.  2.  Distorted common bile duct anatomy secondary to large hiatal hernia and upward herniation of the duodenum,  pancreas and distal common bile duct.  3.  Successful placement of an 8 [place] external percutaneous transhepatic biliary drain. Maintain drain to gravity drainage for now.  Once the patient has stabilized, and decompressed she should return interventional radiology for attempted crossing of the obstructing mass/stricture and internalization of the tube.  4. Once stabilized, further evaluation with MRI and MRCP may be useful to evaluate for underlying mass.  Given the patient's difficult anatomy, if ERCP is not possible tissue diagnosis can be obtained by percutaneous transhepatic biopsy at the time of internalization of the tube.  Signed,  Sterling Big, MD Vascular & Interventional Radiologist [facility] Radiology  Original Report Authenticated By: Vilma Prader   Ir US Guide Bx Asp/drain  03/27/2012  *RADIOLOGY REPORT*  IR PERCUTANEOUS TRANSHEPATIC CHOLANGIOGRAM AND PERCUTANEOUS BILIARY DRAIN PLACEMENT  Date: 03/27/2012  Clinical History: 71 year old female with severe sepsis in the setting of obstructed cholangitis. By CT scan, she has an obstructing mass of uncertain etiology in the distal common bile duct.  The left and right intrahepatic ductal system appeared to communicate to.  We will proceed with placement of a left-sided approach percutaneous biliary drain.  If necessary, bilateral biliary drains will be placed.  Procedures Performed: 1. Ultrasound-guided puncture of a peripheral left bile duct 2.  Percutaneous transhepatic cholangiogram 3.  Placement of a percutaneous transhepatic biliary drain  Interventional Radiologist:  Sterling Big, MD  Sedation: Moderate (conscious) sedation was used.  2 mg Versed, 50 mcg Fentanyl were administered intravenously.  The patient's vital signs were monitored continuously by radiology nursing throughout the procedure.  Sedation Time: 53 minutes  Fluoroscopy time: 23.4  Contrast volume: 60 ml Omnipaque-300 administered into the biliary system   PROCEDURE/FINDINGS:   Informed consent was obtained from the patient following explanation of the procedure, risks, benefits and alternatives. The patient understands, agrees and consents for the procedure. All questions were addressed. A time out  was performed.  Maximal barrier sterile technique utilized including caps, mask, sterile gowns, sterile gloves, large sterile drape, hand hygiene, and betadine skin prep.  The mid epigastric region to the left of midline was interrogated with ultrasound.  Multiple dilated left sided intrahepatic bile duct were identified.  A peripheral (3rd order) bile duct was identified.  Local anesthesia was achieved with infiltration of 1% lidocaine.  Under direct sonographic guidance, the peripheral biliary radicle was punctured with a 21-gauge micropuncture needle. Images obtained stored for the medical record.  An 0.018-inch wire was advanced into the central bile ducts followed by the Accustick sheath.  Hand injection of contrast material through the sheath was performed to retain a percutaneous transhepatic cholangiogram.  The patient's anatomy is markedly distorted secondary to her large hiatal hernia and partial herniation of the stomach, small bowel and pancreas into the chest. Her common bile duct is markedly dilated  and ascends toward the left lower chest.  There is diffuse bilateral intrahepatic biliary ductal dilatation. The distal common bile duct is completely obstructed.  A 4-French Glidecatheter and a glide wire were used to navigate through the l left biliary ductal system into the common bile duct. The guide wire was then exchanged for the Amplatz wire.  A 8-French percutaneous biliary drain was then advanced over the wire and positioned in the distal common bile duct just proximal to the obstruction under fluoroscopic guidance.  A sample of green bile was aspirated and sent to the lab for culture.  The tube was then connected to a bag for gravity drainage and  secured to the skin using 2-0 Prolene suture.  There is no immediate complication, the patient tolerated the procedure well.  She was returned to the intensive care unit in unchanged condition.  IMPRESSION:  1.  Percutaneous transhepatic cholangiogram demonstrates complete obstruction of the distal common bile duct with marked common bile duct and intrahepatic ductal dilatation.  The obstruction is relatively smooth.  Differential considerations include obstructing neoplasm (given location ampullary, duodenal, pancreatic and less likely cholangiocarcinoma are possible primaries), and potentially benign biliary stricture.  2.  Distorted common bile duct anatomy secondary to large hiatal hernia and upward herniation of the duodenum, pancreas and distal common bile duct.  3.  Successful placement of an 8 [place] external percutaneous transhepatic biliary drain. Maintain drain to gravity drainage for now.  Once the patient has stabilized, and decompressed she should return interventional radiology for attempted crossing of the obstructing mass/stricture and internalization of the tube.  4. Once stabilized, further evaluation with MRI and MRCP may be useful to evaluate for underlying mass.  Given the patient's difficult anatomy, if ERCP is not possible tissue diagnosis can be obtained by percutaneous transhepatic biopsy at the time of internalization of the tube.  Signed,  Sterling Big, MD Vascular & Interventional Radiologist [facility] Radiology  Original Report Authenticated By: Alvino Blood Chest Port 1 View  04/03/2012  *RADIOLOGY REPORT*  Clinical Data: Evaluate endotracheal tube.  Increased secretions.  PORTABLE CHEST - 1 VIEW  Comparison: 1 day prior  Findings: Underlying hyperinflation.  Endotracheal tube terminates approximate 1.5 cm above the carina.  Nasogastric tube extends beyond the  inferior aspect of the film.  Right IJ central line terminates at the low SVC.  Underlying hyperinflation.  Patient  rotated right.  Mild cardiomegaly.  Layering bilateral pleural effusions are similar. No pneumothorax.  Biapical pleural thickening.  Similar bibasilar airspace disease.  Increased density projecting over the right upper  lung.  IMPRESSION:  1.  Similar appearance of small bilateral pleural effusions and bibasilar airspace disease. 2.  Endotracheal tube borderline low in position.  Consider retraction 2 - 3 cm. 3.  Cannot exclude developing right upper lobe airspace disease. Recommend attention on follow-up.   Original Report Authenticated By: Consuello Bossier, M.D.    Dg Chest Port 1 View  04/02/2012  *RADIOLOGY REPORT*  Clinical Data: Check ET tube.  PORTABLE CHEST - 1 VIEW  Comparison: 04/01/2012  Findings: Support devices are in stable position.  Endotracheal tube tip is at the carina and could be retracted slightly. There is hyperinflation of the lungs compatible with COPD.  Bilateral lower lobe airspace opacities and layering effusions.  Cardiomegaly.  No real change since prior study.  IMPRESSION: No interval change.  Endotracheal tube is near the carina and could be retracted slightly for optimal positioning.   Original Report Authenticated By: Cyndie Chime, M.D.    Dg Chest Port 1 View  04/01/2012  *RADIOLOGY REPORT*  Clinical Data: Evaluate ET tube placement  PORTABLE CHEST - 1 VIEW  Comparison: 03/31/2012  Findings: The endotracheal tube tip is above the carina.  There is a right IJ catheter with tip in the projection of the SVC. Bilateral pleural effusions are unchanged. There is a mild interstitial edema.  Large hiatal hernia is again noted.  IMPRESSION:  1.  No change in position of ET tube. 2.  Persistent pleural effusions and mild interstitial edema.   Original Report Authenticated By: Rosealee Albee, M.D.    Dg Chest Port 1 View  03/31/2012  *RADIOLOGY REPORT*  Clinical Data: Sepsis  PORTABLE CHEST - 1 VIEW  Comparison: 03/29/2012  Findings: Endotracheal tube 2.2 cm above the carina.   Other support apparatus stable.  Bilateral pleural effusions persist with bibasilar airspace disease/consolidation.  Large hiatal hernia noted with left hemidiaphragm elevation as before.  Stable heart size and vascularity.  No pneumothorax.  No significant interval change.  IMPRESSION: Stable portable chest exam   Original Report Authenticated By: Judie Petit. Ruel Favors, M.D.    Dg Chest Port 1 View  03/29/2012  *RADIOLOGY REPORT*  Clinical Data: Endotracheal tube check  PORTABLE CHEST - 1 VIEW  Comparison: Yesterday  Findings: Endotracheal tube tip is 1.7 cm from the carina.  Stable NG tube and epigastric biliary drain.  Right internal jugular vein central venous catheter stable.  Bilateral pleural effusions and bibasilar airspace disease stable.  No pneumothorax.  There is continued shift of the mediastinum to the right worrisome for right lower lobe volume loss.  IMPRESSION: Endotracheal tube 1.7 cm from the carina.  Otherwise stable exam.  Original Report Authenticated By: Donavan Burnet, M.D.   Dg Chest Port 1 View  03/28/2012  *RADIOLOGY REPORT*  Clinical Data: Respiratory difficulty  PORTABLE CHEST - 1 VIEW  Comparison: 03/26/2012  Findings: Cardiomegaly.  Endotracheal tube and right internal jugular vein central venous catheter stable.  NG tube tip is in the body of the stomach.  Epigastric biliary drain placed.  Bilateral pleural effusions and bilateral airspace disease not significantly changed.  No pneumothorax.  IMPRESSION: NG tube placed.  Stable bilateral airspace disease and bilateral pleural effusions.  Original Report Authenticated By: Donavan Burnet, M.D.   Dg Chest Port 1 View  03/26/2012  *RADIOLOGY REPORT*  Clinical Data: Endotracheal tube placement.  Central venous catheter placement.  PORTABLE CHEST - 1 VIEW  Comparison: 03/26/2012.  Findings: Endotracheal tube is 15 mm from the carina.  New right IJ central line is present with its tip one vertebral body below the carina, in the  mid-to-lower SVC.  Large bilateral pleural effusions.  Cardiopericardial silhouette mostly obscured by effusions.  Basilar atelectasis.  Pulmonary vascular congestion. Bobby pin projects over the right upper chest.  IMPRESSION:  1.  New endotracheal tube is 15 mm from the carina. 2.  Uncomplicated right IJ central line placement with the tip in the mid SVC. 3.  Bilateral pleural effusions, left greater than right with associated compressive atelectasis.  Probable cardiomegaly with portions of the cardiopericardial silhouette obscured  Original Report Authenticated By: Andreas Newport, M.D.   Dg Chest Port 1 View  03/26/2012  *RADIOLOGY REPORT*  Clinical Data: Chest pressure.  PORTABLE CHEST - 1 VIEW  Comparison: 03/24/2012.  Findings: The cardiopericardial size silhouette is obscured. Surgical clip is present in the lower chest. Bilateral left greater than right pleural effusions are present, increased from prior exam.  Additionally, there is pulmonary vascular congestion and bilateral lower lobe airspace disease, most compatible with pulmonary edema in the interval since the prior exam.  IMPRESSION: Findings compatible with moderate CHF superimposed on chronic changes.  Bilateral left greater than right pleural effusions.  Original Report Authenticated By: Andreas Newport, M.D.    Microbiology: Recent Results (from the past 240 hour(s))  VRE CULTURE     Status: Normal   Collection Time   03/29/12  5:16 AM      Component Value Range Status Comment   Specimen Description STOOL   Final    Special Requests NONE   Final    Culture NEGATIVE FOR VANCOMYCIN RESISTANT ENTEROCOCCI   Final    Report Status 04/04/2012 FINAL   Final   CULTURE, BLOOD (ROUTINE X 2)     Status: Normal (Preliminary result)   Collection Time   04/06/12  4:03 PM      Component Value Range Status Comment   Specimen Description BLOOD ARM RIGHT   Final    Special Requests BOTTLES DRAWN AEROBIC ONLY 10CC   Final    Culture  Setup Time  04/06/2012 23:41   Final    Culture     Final    Value:        BLOOD CULTURE RECEIVED NO GROWTH TO DATE CULTURE WILL BE HELD FOR 5 DAYS BEFORE ISSUING A FINAL NEGATIVE REPORT   Report Status PENDING   Incomplete   CULTURE, BLOOD (ROUTINE X 2)     Status: Normal (Preliminary result)   Collection Time   04/06/12  4:09 PM      Component Value Range Status Comment   Specimen Description BLOOD HAND LEFT   Final    Special Requests BOTTLES DRAWN AEROBIC ONLY 5CC   Final    Culture  Setup Time 04/06/2012 23:41   Final    Culture     Final    Value:        BLOOD CULTURE RECEIVED NO GROWTH TO DATE CULTURE WILL BE HELD FOR 5 DAYS BEFORE ISSUING A FINAL NEGATIVE REPORT   Report Status PENDING   Incomplete      Labs: Basic Metabolic Panel:  Lab 04/06/12 1610 04/05/12 0612 04/04/12 0415 04/03/12 0500 04/02/12 0414 04/01/12 0415  NA 137 139 138 139 145 --  K 3.5 4.4 4.0 3.7 3.9 --  CL 98 100 99 100 105 --  CO2 31 33* 34* 35* 37* --  GLUCOSE 96 105* 83 132* 110* --  BUN 14 16 16  17  15 --  CREATININE 0.44* 0.49* 0.39* 0.45* 0.50 --  CALCIUM 8.7 9.1 8.9 8.5 8.5 --  MG -- -- 2.1 -- 2.3 1.9  PHOS -- -- 2.9 -- 2.6 2.9   Liver Function Tests:  Lab 04/02/12 0414  AST 18  ALT 29  ALKPHOS 98  BILITOT 1.1  PROT 4.7*  ALBUMIN 2.1*    Lab 04/01/12 0415  LIPASE 139*  AMYLASE 66   No results found for this basename: AMMONIA:5 in the last 168 hours CBC:  Lab 04/06/12 0511 04/05/12 0612 04/04/12 0415 04/02/12 0414 04/01/12 0415  WBC 8.0 11.9* 13.9* 16.0* 19.1*  NEUTROABS -- -- -- -- --  HGB 8.9* 9.5* 8.9* 8.6* 9.2*  HCT 28.3* 30.1* 27.5* 26.0* 27.4*  MCV 98.3 99.0 97.2 95.9 92.3  PLT 306 291 241 144* 96*   Cardiac Enzymes: No results found for this basename: CKTOTAL:5,CKMB:5,CKMBINDEX:5,TROPONINI:5 in the last 168 hours BNP: BNP (last 3 results)  Basename 04/04/12 0415 04/03/12 0505 04/01/12 0415  PROBNP 1006.0* 1237.0* 2793.0*   CBG:  Lab 04/07/12 1132 04/07/12 0746 04/06/12 2103  04/06/12 1643 04/06/12 1209  GLUCAP 155* 131* 143* 76 160*    Time coordinating discharge: 65 minutes  Signed:  Karsyn Rochin  [facility] 04/07/2012, 2:37 PM

## 2012-04-07 NOTE — Progress Notes (Addendum)
CSW met with pt at bedside along with pt son to discuss bed offers. Pt and pt son choose [facility] out of bed offers. CSW left message with Sonny Dandy to confirm bed choice and to discuss private room availability.   CSW submitted clinicals for [facility] including 30 day note, psych notes, progress note, meds, and fl2.   .Clinical social worker continuing to follow pt to assist with pt dc plans and further csw needs.   Catha Gosselin, Theresia Majors  [phone] .04/07/2012 1034am

## 2012-04-08 LAB — GLUCOSE, CAPILLARY
Glucose-Capillary: 239 mg/dL — ABNORMAL HIGH (ref 70–99)
Glucose-Capillary: 118 mg/dL — ABNORMAL HIGH (ref 70–99)

## 2012-04-08 MED ORDER — AMOXICILLIN-POT CLAVULANATE 875-125 MG PO TABS: 1.0000 | ORAL_TABLET | Freq: Two times a day (BID) | ORAL | Status: AC

## 2012-04-08 MED ORDER — SODIUM CHLORIDE 0.9 % IV SOLN: 3.0000 g | Freq: Four times a day (QID) | INTRAVENOUS | Status: AC

## 2012-04-08 MED ORDER — SODIUM CHLORIDE 0.9 % IJ SOLN: 10.0000 mL | INTRAMUSCULAR | Status: DC | PRN

## 2012-04-08 MED ORDER — HYDROCODONE-ACETAMINOPHEN 7.5-325 MG PO TABS: 1.0000 | ORAL_TABLET | Freq: Four times a day (QID) | ORAL | Status: DC | PRN

## 2012-04-08 NOTE — Progress Notes (Signed)
.  Clinical social worker assisted with patient discharge to skilled nursing facility, [facility]. Pt picc placed this morning. CSW send updated dc summary and avs. .Patient transportation provided by [employer] [facility] with patient chart copy. .No further Clinical Social Work needs, signing off.   Catha Gosselin, Theresia Majors  [phone] .04/08/2012   13:27pm

## 2012-04-08 NOTE — Progress Notes (Signed)
Pt discharged to [facility] per MD order.   Pt and son received all discharge instructions and medication information including follow-up appointments and prescriptions.  Pt alert and oriented at discharge with no complaints of pain.  Pt discharged with biliary drain and PICC line in place.  Report called to Sam, RN at [place] prior to discharge.   Efraim Kaufmann

## 2012-04-08 NOTE — Discharge Summary (Addendum)
Physician Discharge Summary  Patient ID: Laurie Sparks MRN: [id number] DOB/AGE: 1940-09-10 71 y.o.  Admit date: 03/24/2012 Discharge date: 04/08/2012  PCP: Warrick Parisian, MD  DISCHARGE DIAGNOSES:  Principal Problem:  *Septic shock Active Problems:  Chest pain syndrome  CAD (coronary artery disease)  COPD (chronic obstructive pulmonary disease)  Anxiety  CHF (congestive heart failure)  Vitamin D deficiency disease  Suicidal ideation  UTI (lower urinary tract infection)  Hypotension  Fever  Acute respiratory failure with hypoxia  Acute cholangitis   RECOMMENDATIONS TO PCP: 1. Unasyn till 9/3 2. Augmentin from 9/4 till IR procedure on 9/13. Will need to ask Dr. Daiva Eves regarding treatment beyond that time.  DISCHARGE CONDITION: fair  INITIAL HISTORY: 71 y/o female with prior cholecystectomy, CHF and CAD admitted 8/14 with chest pain developed hypotension, tachycardia and worsening hypoxemic respiratory failure likely in the setting of sepsis likely due to cholangitis vs less likely SBO/abdominal process. Final dx: Primary CBD stones with obstructive jaundice, cholangitis, septic shock.  HOSPITAL COURSE:   Acute on chronic hypoxemic respiratory failure due to pulmonary edema, effusions, COPD on 3 L O2 at home  This has resolved. Patient remains stable.  Septic Shock Resolved. Leukocytosis resolved. Currently on Unasyn to complete the course till sep 3rd, and she can be started on Augmentin till the patient has the procedure on 04/24/12.   Oliguria and Hyponatremia Resolved.   Elevated transaminases and alk phos due to cholangitis She has a history of Chole. Has CBD obstruction due to Primary Gall stone. Anatomy complicated by huge hiatal hernia. HX of multiple lap In past. On 8/16 she had an external biliary drain. On 8/17 Started tube feeds. On 03/31/12: IR procedure - gall stones blocking CBD. No evidence of malignancy. S/p internal external biliary drain. On 8/21 GI  felt ERCP sphincterotomy too anatomically difficult because majority of stomach in chest (herniation and stone too big). Recommending IR ampullary sphincterotomy via IR. On 8/22 GI and [facility] opinion: procedure to remove primary bile duct not technically feasible. So, IR plans sphincterotomy through int-external biliary drain in 2 weeks from 04/02/12 on 04/24/12. ID consult obtained for the duration of antibiotics, recommendations are to continue with Unasyn till sep 3rd and start augmentin after that till the procedure. And plan to see Dr Zenaida Niece dam in the office before the antibiotics are done.   Suicidal Ideation and Depression Currently she does not have suicidal ideations. She is motivated to get better. Her pain is better. Psychiatry saw the patient and recommended to start her on Ambien at night as needed and to avoid benzodiazepines.   Thrombocytopenia Likely sepsis related (not on heparin, not in DIC) resolved. Platelets are normal now.   Anemia of critical illness Stable between 8 to 9.   Patient is stable today. Await transfer to SNF. PICC line has been placed.   Procedures:  External Biliary Drain PICC line placement 04/08/12   Consultations:  Surgery  [facility]  IR  PSYCHIATRY  PCCM   IMAGING STUDIES Dg Chest 2 View  03/24/2012  *RADIOLOGY REPORT*  Clinical Data: Mid and left chest pain.  CHEST - 2 VIEW  Comparison: PA and lateral chest 07/26/2010.  Findings: Marked elevation of the left hemidiaphragm is again seen. The chest is hyperexpanded.  Blunting of the right costophrenic angle is unchanged.  Heart size is upper normal.  IMPRESSION: No acute finding.  Stable compared to prior exam.  Original Report Authenticated By: Bernadene Bell. Maricela Curet, M.D.   Ct Abdomen Pelvis  W Contrast  03/26/2012  *RADIOLOGY REPORT*  Clinical Data: Abdominal pain, septic shock, nausea/vomiting  CT ABDOMEN AND PELVIS WITH CONTRAST  Technique:  Multidetector CT imaging of the abdomen and pelvis was  performed following the standard protocol during bolus administration of intravenous contrast.  Contrast: 80mL OMNIPAQUE IOHEXOL 300 MG/ML  SOLN  Comparison: None.  Findings: Very large hiatal hernia, incompletely visualized, containing stomach to the right of midline (series 2/image 7), multiple loops of opacified and nonopacified bowel, and pancreas centrally (series 2/image 6).  Liver is notable for moderate to severe central intrahepatic ductal dilatation.  Common bile duct is markedly dilated with a 1.5 x 1.0 cm abnormal soft tissue lesion centrally (series 2/image 22) and a 2.7 x 1.9 cm obstructing mass distally near the ampulla in the left abdomen (series 2/image 15).  Status post cholecystectomy.  Spleen and adrenal glands are unremarkable.  5 mm nonobstructing left upper pole calculus (series 2/image 11). Right kidney is unremarkable.  No hydronephrosis.  Enteric tube terminates in the proximal duodenum in the left abdomen (series 2/image 23).  Contrast passes into the colon, indicating that the large hernia is nonobstructive.  Colonic diverticulosis, without associated inflammatory changes.  Atherosclerotic calcifications of the abdominal aorta and branch vessels.  Small volume abdominopelvic ascites.  Status post hysterectomy.  No adnexal masses.  Bladder is notable for nondependent gas and an indwelling Foley catheter.  Prior ORIF of the proximal left femur.  Degenerative changes of the visualized thoracolumbar spine.  Mild superior endplate compression deformities at L2 and L5.  IMPRESSION:  Distorted anatomy with very large hiatal hernia containing stomach, multiple loops of bowel, and pancreas, incompletely visualized.  No evidence of bowel obstruction.  Marked dilatation of the common bile duct with at least two intraductal lesions, as described above, including a 2.7 x 1.9 cm obstructing mass near the ampulla. Moderate to severe intrahepatic ductal dilatation.  Additional ancillary findings as  above.  Original Report Authenticated By: Charline Bills, M.D.   Ir Cholan Exist Tube  03/31/2012  *RADIOLOGY REPORT*  Clinical history:71 year old with obstructive jaundice.  The patient currently has an external biliary drain.  PROCEDURE(S): CHOLANGIOGRAM THROUGH EXISTING TUBE; PLACEMENT OF INTERNAL EXTERNAL BILIARY DRAIN  Physician: Rachelle Hora. Henn, MD  Medications:Versed 2 mg IV  Fluoroscopy time: 5.5 minutes  Contrast:  25 ml Omnipaque-300  Procedure:Informed consent was obtained for a cholangiogram and internalization of biliary drain.  The patient was also consented for a possible brush biopsy.  The patient was placed supine on the interventional table.  The existing biliary drain was prepped and draped in a sterile fashion.  Maximal barrier sterile technique was utilized including caps, mask, sterile gowns, sterile gloves, sterile drape, hand hygiene and skin antiseptic.  Contrast was injected into the biliary drain.  The biliary catheter was cut and removed over a Bentson wire.  5-French Kumpe catheter was used to perform additional cholangiograms.  Catheter was also advanced into the duodenum.  A Bentson wire was placed in the small bowel and a 10-French internal-external biliary drain was placed over a Bentson wire.  The distal aspect of the drain was reconstituted in the small bowel.  Catheter sutured to the skin and attached to a gravity bag.  Findings:The cholangiogram demonstrated an external biliary drain. The patient's anatomy is very unusual in that the common bile duct extends cephalad and is left of midline.  Cholangiogram demonstrated a very large filling defect in the distal common bile duct consistent with a large stone.  There are additional filling defects throughout the biliary system suggestive for additional stones.  The ampulla is widely patent and contrast is now spontaneously draining into the small bowel.  An underlying stricture or lesion was not identified.  Complications: None   Impression:  Successful placement of an internal-external biliary drain.  The cause of the obstructive jaundice is due to biliary stones. There was a very large stone in the distal common bile duct which was mobile throughout the procedure.  Plan:  Plan internal-external biliary drainage until the patient has fully recovered from sepsis and respiratory distress.  Will need GI's opinion about whether ERCP and stone extraction are possible due to the patient's abnormal anatomy.  Otherwise, the patient may be a candidate for percutaneous sphincteroplasty and stone removal.   Original Report Authenticated By: Richarda Overlie, M.D.     Ir Biliary Drain Catheter Placement  03/31/2012  *RADIOLOGY REPORT*  Clinical history:71 year old with obstructive jaundice.  The patient currently has an external biliary drain.  PROCEDURE(S): CHOLANGIOGRAM THROUGH EXISTING TUBE; PLACEMENT OF INTERNAL EXTERNAL BILIARY DRAIN  Physician: Rachelle Hora. Henn, MD  Medications:Versed 2 mg IV  Fluoroscopy time: 5.5 minutes  Contrast:  25 ml Omnipaque-300  Procedure:Informed consent was obtained for a cholangiogram and internalization of biliary drain.  The patient was also consented for a possible brush biopsy.  The patient was placed supine on the interventional table.  The existing biliary drain was prepped and draped in a sterile fashion.  Maximal barrier sterile technique was utilized including caps, mask, sterile gowns, sterile gloves, sterile drape, hand hygiene and skin antiseptic.  Contrast was injected into the biliary drain.  The biliary catheter was cut and removed over a Bentson wire.  5-French Kumpe catheter was used to perform additional cholangiograms.  Catheter was also advanced into the duodenum.  A Bentson wire was placed in the small bowel and a 10-French internal-external biliary drain was placed over a Bentson wire.  The distal aspect of the drain was reconstituted in the small bowel.  Catheter sutured to the skin and attached to a  gravity bag.  Findings:The cholangiogram demonstrated an external biliary drain. The patient's anatomy is very unusual in that the common bile duct extends cephalad and is left of midline.  Cholangiogram demonstrated a very large filling defect in the distal common bile duct consistent with a large stone.  There are additional filling defects throughout the biliary system suggestive for additional stones.  The ampulla is widely patent and contrast is now spontaneously draining into the small bowel.  An underlying stricture or lesion was not identified.  Complications: None  Impression:  Successful placement of an internal-external biliary drain.  The cause of the obstructive jaundice is due to biliary stones. There was a very large stone in the distal common bile duct which was mobile throughout the procedure.  Plan:  Plan internal-external biliary drainage until the patient has fully recovered from sepsis and respiratory distress.  Will need GI's opinion about whether ERCP and stone extraction are possible due to the patient's abnormal anatomy.  Otherwise, the patient may be a candidate for percutaneous sphincteroplasty and stone removal.   Original Report Authenticated By: Richarda Overlie, M.D.    Ir US Guide Bx Asp/drain  03/27/2012  *RADIOLOGY REPORT*  IR PERCUTANEOUS TRANSHEPATIC CHOLANGIOGRAM AND PERCUTANEOUS BILIARY DRAIN PLACEMENT  Date: 03/27/2012  Clinical History: 71 year old female with severe sepsis in the setting of obstructed cholangitis. By CT scan, she has an obstructing mass of uncertain etiology in the  distal common bile duct.  The left and right intrahepatic ductal system appeared to communicate to.  We will proceed with placement of a left-sided approach percutaneous biliary drain.  If necessary, bilateral biliary drains will be placed.  Procedures Performed: 1. Ultrasound-guided puncture of a peripheral left bile duct 2.  Percutaneous transhepatic cholangiogram 3.  Placement of a percutaneous  transhepatic biliary drain  Interventional Radiologist:  Sterling Big, MD  Sedation: Moderate (conscious) sedation was used.  2 mg Versed, 50 mcg Fentanyl were administered intravenously.  The patient's vital signs were monitored continuously by radiology nursing throughout the procedure.  Sedation Time: 53 minutes  Fluoroscopy time: 23.4  Contrast volume: 60 ml Omnipaque-300 administered into the biliary system  PROCEDURE/FINDINGS:   Informed consent was obtained from the patient following explanation of the procedure, risks, benefits and alternatives. The patient understands, agrees and consents for the procedure. All questions were addressed. A time out was performed.  Maximal barrier sterile technique utilized including caps, mask, sterile gowns, sterile gloves, large sterile drape, hand hygiene, and betadine skin prep.  The mid epigastric region to the left of midline was interrogated with ultrasound.  Multiple dilated left sided intrahepatic bile duct were identified.  A peripheral (3rd order) bile duct was identified.  Local anesthesia was achieved with infiltration of 1% lidocaine.  Under direct sonographic guidance, the peripheral biliary radicle was punctured with a 21-gauge micropuncture needle. Images obtained stored for the medical record.  An 0.018-inch wire was advanced into the central bile ducts followed by the Accustick sheath.  Hand injection of contrast material through the sheath was performed to retain a percutaneous transhepatic cholangiogram.  The patient's anatomy is markedly distorted secondary to her large hiatal hernia and partial herniation of the stomach, small bowel and pancreas into the chest. Her common bile duct is markedly dilated  and ascends toward the left lower chest.  There is diffuse bilateral intrahepatic biliary ductal dilatation. The distal common bile duct is completely obstructed.  A 4-French Glidecatheter and a glide wire were used to navigate through the l left  biliary ductal system into the common bile duct. The guide wire was then exchanged for the Amplatz wire.  A 8-French percutaneous biliary drain was then advanced over the wire and positioned in the distal common bile duct just proximal to the obstruction under fluoroscopic guidance.  A sample of green bile was aspirated and sent to the lab for culture.  The tube was then connected to a bag for gravity drainage and secured to the skin using 2-0 Prolene suture.  There is no immediate complication, the patient tolerated the procedure well.  She was returned to the intensive care unit in unchanged condition.  IMPRESSION:  1.  Percutaneous transhepatic cholangiogram demonstrates complete obstruction of the distal common bile duct with marked common bile duct and intrahepatic ductal dilatation.  The obstruction is relatively smooth.  Differential considerations include obstructing neoplasm (given location ampullary, duodenal, pancreatic and less likely cholangiocarcinoma are possible primaries), and potentially benign biliary stricture.  2.  Distorted common bile duct anatomy secondary to large hiatal hernia and upward herniation of the duodenum, pancreas and distal common bile duct.  3.  Successful placement of an 8 [place] external percutaneous transhepatic biliary drain. Maintain drain to gravity drainage for now.  Once the patient has stabilized, and decompressed she should return interventional radiology for attempted crossing of the obstructing mass/stricture and internalization of the tube.  4. Once stabilized, further evaluation with MRI and MRCP  may be useful to evaluate for underlying mass.  Given the patient's difficult anatomy, if ERCP is not possible tissue diagnosis can be obtained by percutaneous transhepatic biopsy at the time of internalization of the tube.  Signed,  Sterling Big, MD Vascular & Interventional Radiologist [facility] Radiology  Original Report Authenticated By: Alvino Blood Chest Port  1 View  04/03/2012  *RADIOLOGY REPORT*  Clinical Data: Evaluate endotracheal tube.  Increased secretions.  PORTABLE CHEST - 1 VIEW  Comparison: 1 day prior  Findings: Underlying hyperinflation.  Endotracheal tube terminates approximate 1.5 cm above the carina.  Nasogastric tube extends beyond the  inferior aspect of the film.  Right IJ central line terminates at the low SVC.  Underlying hyperinflation.  Patient rotated right.  Mild cardiomegaly.  Layering bilateral pleural effusions are similar. No pneumothorax.  Biapical pleural thickening.  Similar bibasilar airspace disease.  Increased density projecting over the right upper lung.  IMPRESSION:  1.  Similar appearance of small bilateral pleural effusions and bibasilar airspace disease. 2.  Endotracheal tube borderline low in position.  Consider retraction 2 - 3 cm. 3.  Cannot exclude developing right upper lobe airspace disease. Recommend attention on follow-up.   Original Report Authenticated By: Consuello Bossier, M.D.    Dg Chest Port 1 View  04/02/2012  *RADIOLOGY REPORT*  Clinical Data: Check ET tube.  PORTABLE CHEST - 1 VIEW  Comparison: 04/01/2012  Findings: Support devices are in stable position.  Endotracheal tube tip is at the carina and could be retracted slightly. There is hyperinflation of the lungs compatible with COPD.  Bilateral lower lobe airspace opacities and layering effusions.  Cardiomegaly.  No real change since prior study.  IMPRESSION: No interval change.  Endotracheal tube is near the carina and could be retracted slightly for optimal positioning.   Original Report Authenticated By: Cyndie Chime, M.D.    Dg Chest Port 1 View  04/01/2012  *RADIOLOGY REPORT*  Clinical Data: Evaluate ET tube placement  PORTABLE CHEST - 1 VIEW  Comparison: 03/31/2012  Findings: The endotracheal tube tip is above the carina.  There is a right IJ catheter with tip in the projection of the SVC. Bilateral pleural effusions are unchanged. There is a mild  interstitial edema.  Large hiatal hernia is again noted.  IMPRESSION:  1.  No change in position of ET tube. 2.  Persistent pleural effusions and mild interstitial edema.   Original Report Authenticated By: Rosealee Albee, M.D.    Dg Chest Port 1 View  03/31/2012  *RADIOLOGY REPORT*  Clinical Data: Sepsis  PORTABLE CHEST - 1 VIEW  Comparison: 03/29/2012  Findings: Endotracheal tube 2.2 cm above the carina.  Other support apparatus stable.  Bilateral pleural effusions persist with bibasilar airspace disease/consolidation.  Large hiatal hernia noted with left hemidiaphragm elevation as before.  Stable heart size and vascularity.  No pneumothorax.  No significant interval change.  IMPRESSION: Stable portable chest exam   Original Report Authenticated By: Judie Petit. Ruel Favors, M.D.    Dg Chest Port 1 View  03/29/2012  *RADIOLOGY REPORT*  Clinical Data: Endotracheal tube check  PORTABLE CHEST - 1 VIEW  Comparison: Yesterday  Findings: Endotracheal tube tip is 1.7 cm from the carina.  Stable NG tube and epigastric biliary drain.  Right internal jugular vein central venous catheter stable.  Bilateral pleural effusions and bibasilar airspace disease stable.  No pneumothorax.  There is continued shift of the mediastinum to the right worrisome for right lower lobe  volume loss.  IMPRESSION: Endotracheal tube 1.7 cm from the carina.  Otherwise stable exam.  Original Report Authenticated By: Donavan Burnet, M.D.   Dg Chest Port 1 View  03/28/2012  *RADIOLOGY REPORT*  Clinical Data: Respiratory difficulty  PORTABLE CHEST - 1 VIEW  Comparison: 03/26/2012  Findings: Cardiomegaly.  Endotracheal tube and right internal jugular vein central venous catheter stable.  NG tube tip is in the body of the stomach.  Epigastric biliary drain placed.  Bilateral pleural effusions and bilateral airspace disease not significantly changed.  No pneumothorax.  IMPRESSION: NG tube placed.  Stable bilateral airspace disease and bilateral pleural  effusions.  Original Report Authenticated By: Donavan Burnet, M.D.   Dg Chest Port 1 View  03/26/2012  *RADIOLOGY REPORT*  Clinical Data: Endotracheal tube placement.  Central venous catheter placement.  PORTABLE CHEST - 1 VIEW  Comparison: 03/26/2012.  Findings: Endotracheal tube is 15 mm from the carina.  New right IJ central line is present with its tip one vertebral body below the carina, in the mid-to-lower SVC.  Large bilateral pleural effusions.  Cardiopericardial silhouette mostly obscured by effusions.  Basilar atelectasis.  Pulmonary vascular congestion. Bobby pin projects over the right upper chest.  IMPRESSION:  1.  New endotracheal tube is 15 mm from the carina. 2.  Uncomplicated right IJ central line placement with the tip in the mid SVC. 3.  Bilateral pleural effusions, left greater than right with associated compressive atelectasis.  Probable cardiomegaly with portions of the cardiopericardial silhouette obscured  Original Report Authenticated By: Andreas Newport, M.D.   Dg Chest Port 1 View  03/26/2012  *RADIOLOGY REPORT*  Clinical Data: Chest pressure.  PORTABLE CHEST - 1 VIEW  Comparison: 03/24/2012.  Findings: The cardiopericardial size silhouette is obscured. Surgical clip is present in the lower chest. Bilateral left greater than right pleural effusions are present, increased from prior exam.  Additionally, there is pulmonary vascular congestion and bilateral lower lobe airspace disease, most compatible with pulmonary edema in the interval since the prior exam.  IMPRESSION: Findings compatible with moderate CHF superimposed on chronic changes.  Bilateral left greater than right pleural effusions.  Original Report Authenticated By: Andreas Newport, M.D.    DISCHARGE EXAMINATION: Blood pressure 115/71, pulse 100, temperature 98.8 F (37.1 C), temperature source Oral, resp. rate 16, height 5\' 1"  (1.549 m), weight 43 kg (94 lb 12.8 oz), SpO2 96.00%. General appearance: alert, cooperative,  appears stated age and no distress Resp: clear to auscultation bilaterally Cardio: regular rate and rhythm, S1, S2 normal, no murmur, click, rub or gallop Extremities: extremities normal, atraumatic, no cyanosis or edema Neurologic: Grossly normal  DISPOSITION: SNF  Discharge Orders    Future Appointments: Provider: Department: Dept Phone: Center:   04/22/2012 4:30 PM Randall Hiss, MD [facility] [phone] [facility]   04/24/2012 9:00 AM [facility] [phone] [facility]     Future Orders Please Complete By Expires   Diet - low sodium heart healthy      Activity as tolerated - No restrictions      Discharge instructions      Comments:   Follow up with IR WITH DR HOSS ON 04/24/12 for the procedure., AND FOLLOW UP WITH [facility] before the antibiotics are done. Please use unasyn till august 3rd and  Please start augmentin on august 3rd, after the unasyn is finished and see Dr Daiva Eves in [facility] before the antibiotics are finished.  Current Discharge Medication List    START taking these medications   Details  albuterol (PROVENTIL) (5 MG/ML) 0.5% nebulizer solution Take 0.5 mLs (2.5 mg total) by nebulization 3 (three) times daily as needed for wheezing. Qty: 20 mL, Refills: 1    alum & mag hydroxide-simeth (MAALOX/MYLANTA) 200-200-20 MG/5ML suspension Take 15 mLs by mouth every 4 (four) hours as needed. Qty: 355 mL    amoxicillin-clavulanate (AUGMENTIN) 875-125 MG per tablet Take 1 tablet by mouth 2 (two) times daily. Start on 04/15/12 Qty: 22 tablet, Refills: 0    feeding supplement (RESOURCE BREEZE) LIQD Take 1 Container by mouth 3 (three) times daily between meals.    HYDROcodone-acetaminophen (NORCO) 10-325 MG per tablet Take 2 tablets by mouth every 3 (three) hours as needed. Qty: 30 tablet, Refills: 0    Hydrocortisone (GERHARDT'S BUTT CREAM) CREA Apply 1 application topically daily.    ipratropium (ATROVENT) 0.02 % nebulizer  solution Take 2.5 mLs (0.5 mg total) by nebulization 3 (three) times daily as needed for wheezing. Qty: 75 mL, Refills: 0    Multiple Vitamin (MULTIVITAMIN WITH MINERALS) TABS Take 1 tablet by mouth daily.    sodium chloride 0.9 % SOLN 100 mL with Ampicillin-Sulbactam 3 (2-1) G SOLR 3 g Inject 3 g into the vein every 6 (six) hours. Till 04/14/12 and then stop      CONTINUE these medications which have NOT CHANGED   Details  aspirin EC 81 MG tablet Take 81 mg by mouth daily.    carvedilol (COREG) 12.5 MG tablet Take 12.5 mg by mouth 2 (two) times daily with a meal.    nitroGLYCERIN (NITROSTAT) 0.4 MG SL tablet Place 0.4 mg under the tongue every 5 (five) minutes as needed. For chest pain      STOP taking these medications     HYDROcodone-acetaminophen (NORCO) 7.5-325 MG per tablet        Follow-up Information    Call Department of Social Services. (Call or go to office to inquire about food stamps and Medicaid)    Contact information:   [address] [place], [place] [phone]      Follow up with [facility] on 04/24/2012. (arrive to short stay at 0730. Nothing to eat after midnight night before. Need driver. Okay to take meds with sips of water )    Contact information:   Dr. Lenoria Farrier Hoss [address] [place] [address] [phone]      Follow up with Acey Lav, MD in 1 week.   Contact information:   [address] [place] [address] [phone]       Follow up with Warrick Parisian, MD. Schedule an appointment as soon as possible for a visit in 2 weeks.   Contact information:   [address] [place] [address] [phone]          TOTAL DISCHARGE TIME: 40 mins  [facility]  [facility] Pager [phone]  04/08/2012, 12:44 PM

## 2012-04-08 NOTE — Progress Notes (Signed)
Peripherally Inserted Central Catheter/Midline Placement  The IV Nurse has discussed with the patient and/or persons authorized to consent for the patient, the purpose of this procedure and the potential benefits and risks involved with this procedure.  The benefits include less needle sticks, lab draws from the catheter and patient may be discharged home with the catheter.  Risks include, but not limited to, infection, bleeding, blood clot (thrombus formation), and puncture of an artery; nerve damage and irregular heat beat.  Alternatives to this procedure were also discussed.  PICC/Midline Placement Documentation        Lisabeth Devoid 04/08/2012, 10:55 AM Consent obtained by Merleen Milliner, RN, CRNI

## 2012-04-12 LAB — CULTURE, BLOOD (ROUTINE X 2): Culture: NO GROWTH

## 2012-04-21 ENCOUNTER — Other Ambulatory Visit: Payer: Self-pay | Admitting: Physician Assistant

## 2012-04-22 ENCOUNTER — Ambulatory Visit (INDEPENDENT_AMBULATORY_CARE_PROVIDER_SITE_OTHER): Payer: Medicare Other | Admitting: Infectious Disease

## 2012-04-22 ENCOUNTER — Encounter: Payer: Self-pay | Admitting: Infectious Disease

## 2012-04-22 VITALS — BP 122/74 | HR 85 | Temp 98.1°F | Ht 61.0 in | Wt 86.0 lb

## 2012-04-22 DIAGNOSIS — A419 Sepsis, unspecified organism: Secondary | ICD-10-CM

## 2012-04-22 DIAGNOSIS — B952 Enterococcus as the cause of diseases classified elsewhere: Secondary | ICD-10-CM

## 2012-04-22 DIAGNOSIS — B9689 Other specified bacterial agents as the cause of diseases classified elsewhere: Secondary | ICD-10-CM

## 2012-04-22 DIAGNOSIS — A499 Bacterial infection, unspecified: Secondary | ICD-10-CM

## 2012-04-22 DIAGNOSIS — R7881 Bacteremia: Secondary | ICD-10-CM

## 2012-04-22 DIAGNOSIS — A498 Other bacterial infections of unspecified site: Secondary | ICD-10-CM

## 2012-04-22 DIAGNOSIS — K8309 Other cholangitis: Secondary | ICD-10-CM | POA: Insufficient documentation

## 2012-04-22 MED ORDER — AMOXICILLIN-POT CLAVULANATE 875-125 MG PO TABS: 1.0000 | ORAL_TABLET | Freq: Two times a day (BID) | ORAL | Status: DC

## 2012-04-22 NOTE — Patient Instructions (Addendum)
CONTINUE THE AUGMENTIN UNTIL ONE DAY AFTER THE SPHINCTEROTOMY  FU APPT WITH DR. VAN DAM IN 5 WEEKS

## 2012-04-22 NOTE — Progress Notes (Signed)
  Subjective:    Patient ID: Laurie Sparks, female    DOB: 03-11-1941, 71 y.o.   MRN: [id number]  HPI  71 year old admitted with acute cholangitis complicated by polymicrobial bactermia with AMP sensitive Enterococcus (but I to vancomycin) and E. Coli in 2/2 blood cultures on admission and also from biliary material when drain placed. She was treated with 2 weeks of IV Unasyn post clearing of her microbial bacteremia. She then changed to oral Augmentin twice daily which is continued on while she has been in her skilled nursing facility at [facility] and rehabilitation. She is supposed to undergo sphincterotomy on September 13 by interventional radiology.   Review of Systems  Constitutional: Positive for fatigue. Negative for fever, chills, diaphoresis and unexpected weight change.  HENT: Negative for congestion, sore throat, rhinorrhea, sneezing, trouble swallowing and sinus pressure.   Eyes: Negative for photophobia and visual disturbance.  Respiratory: Negative for cough, chest tightness, shortness of breath, wheezing and stridor.   Cardiovascular: Negative for chest pain, palpitations and leg swelling.  Gastrointestinal: Positive for abdominal pain. Negative for nausea, vomiting, diarrhea, constipation, blood in stool, abdominal distention and anal bleeding.  Genitourinary: Negative for dysuria, hematuria, flank pain and difficulty urinating.  Musculoskeletal: Negative for myalgias, back pain, joint swelling, arthralgias and gait problem.  Skin: Positive for wound. Negative for color change, pallor and rash.  Neurological: Negative for dizziness, tremors, weakness and light-headedness.  Hematological: Negative for adenopathy. Does not bruise/bleed easily.  Psychiatric/Behavioral: Negative for behavioral problems, confusion, disturbed wake/sleep cycle, dysphoric mood, decreased concentration and agitation.       Objective:   Physical Exam  Constitutional: She is oriented to person,  place, and time. No distress.  HENT:  Head: Normocephalic and atraumatic.  Mouth/Throat: Oropharynx is clear and moist. No oropharyngeal exudate.  Eyes: Conjunctivae normal and EOM are normal. Pupils are equal, round, and reactive to light. No scleral icterus.  Neck: Normal range of motion. Neck supple. No JVD present.  Cardiovascular: Normal rate, regular rhythm and normal heart sounds.  Exam reveals no gallop and no friction rub.   No murmur heard. Pulmonary/Chest: Effort normal and breath sounds normal. No respiratory distress. She has no wheezes. She has no rales. She exhibits no tenderness.  Abdominal: She exhibits no distension and no mass. There is tenderness. There is no rebound and no guarding.    Musculoskeletal: She exhibits no edema and no tenderness.  Lymphadenopathy:    She has no cervical adenopathy.  Neurological: She is alert and oriented to person, place, and time. She has normal reflexes. She exhibits normal muscle tone. Coordination normal.  Skin: Skin is warm and dry. She is not diaphoretic. No erythema. No pallor.  Psychiatric: She has a normal mood and affect. Her behavior is normal. Judgment and thought content normal.          Assessment & Plan:  Polymicrobial bacterial infection Status post 2 weeks of IV Unasyn now on oral Augmentin.  Bacteremia due to Enterococcus The above  E coli bacteremia  See above  Cholangitis Status post 2 weeks of IV Unasyn. Now on oral Augmentin which I would like her to continue until one day after her sphincterotomy.

## 2012-04-22 NOTE — Assessment & Plan Note (Signed)
Status post 2 weeks of IV Unasyn. Now on oral Augmentin which I would like her to continue until one day after her sphincterotomy.

## 2012-04-22 NOTE — Assessment & Plan Note (Signed)
See above

## 2012-04-22 NOTE — Assessment & Plan Note (Signed)
Status post 2 weeks of IV Unasyn now on oral Augmentin.

## 2012-04-22 NOTE — Assessment & Plan Note (Signed)
The above

## 2012-04-24 ENCOUNTER — Other Ambulatory Visit: Payer: Self-pay | Admitting: Physician Assistant

## 2012-04-24 ENCOUNTER — Encounter (HOSPITAL_COMMUNITY): Payer: Self-pay

## 2012-04-24 ENCOUNTER — Ambulatory Visit (HOSPITAL_COMMUNITY)
Admit: 2012-04-24 | Discharge: 2012-04-24 | Disposition: A | Discharge status: Home / Self Care | Payer: Medicare Other | Source: Ambulatory Visit | Attending: Physician Assistant | Admitting: Physician Assistant

## 2012-04-24 DIAGNOSIS — K8020 Calculus of gallbladder without cholecystitis without obstruction: Secondary | ICD-10-CM

## 2012-04-24 DIAGNOSIS — K8050 Calculus of bile duct without cholangitis or cholecystitis without obstruction: Secondary | ICD-10-CM | POA: Insufficient documentation

## 2012-04-24 DIAGNOSIS — I509 Heart failure, unspecified: Secondary | ICD-10-CM | POA: Insufficient documentation

## 2012-04-24 DIAGNOSIS — I2510 Atherosclerotic heart disease of native coronary artery without angina pectoris: Secondary | ICD-10-CM | POA: Insufficient documentation

## 2012-04-24 DIAGNOSIS — K838 Other specified diseases of biliary tract: Secondary | ICD-10-CM | POA: Insufficient documentation

## 2012-04-24 LAB — CBC WITH DIFFERENTIAL/PLATELET
Basophils Relative: 0 % (ref 0–1)
Eosinophils Absolute: 0.1 10*3/uL (ref 0.0–0.7)
Eosinophils Relative: 2 % (ref 0–5)
Lymphs Abs: 1.3 10*3/uL (ref 0.7–4.0)
MCH: 31.4 pg (ref 26.0–34.0)
MCHC: 31.7 g/dL (ref 30.0–36.0)
MCV: 99 fL (ref 78.0–100.0)
Platelets: 287 10*3/uL (ref 150–400)
RBC: 3.89 MIL/uL (ref 3.87–5.11)
RDW: 15.1 % (ref 11.5–15.5)

## 2012-04-24 LAB — COMPREHENSIVE METABOLIC PANEL
ALT: 13 U/L (ref 0–35)
AST: 25 U/L (ref 0–37)
Alkaline Phosphatase: 126 U/L — ABNORMAL HIGH (ref 39–117)
CO2: 30 mEq/L (ref 19–32)
Chloride: 98 mEq/L (ref 96–112)
GFR calc non Af Amer: >90 mL/min (ref >90)
Glucose, Bld: 92 mg/dL (ref 70–99)
Potassium: 3.4 mEq/L — ABNORMAL LOW (ref 3.5–5.1)
Sodium: 137 mEq/L (ref 135–145)
Total Bilirubin: 0.5 mg/dL (ref 0.3–1.2)

## 2012-04-24 LAB — PROTIME-INR: Prothrombin Time: 13.5 seconds (ref 11.6–15.2)

## 2012-04-24 MED ORDER — HYDROCODONE-ACETAMINOPHEN 7.5-325 MG PO TABS: 1.0000 | ORAL_TABLET | Freq: Four times a day (QID) | ORAL | Status: DC | PRN

## 2012-04-24 MED ORDER — SODIUM CHLORIDE 0.9 % IV SOLN
INTRAVENOUS | Status: AC | PRN
  Administered 2012-04-24: 20 mL/h via INTRAVENOUS

## 2012-04-24 MED ORDER — FENTANYL CITRATE 0.05 MG/ML IJ SOLN
INTRAMUSCULAR | Status: AC | PRN
  Administered 2012-04-24 (×2): 50 ug via INTRAVENOUS
  Administered 2012-04-24 (×4): 25 ug via INTRAVENOUS

## 2012-04-24 MED ORDER — SODIUM CHLORIDE 0.9 % IV SOLN
INTRAVENOUS | Status: DC
  Administered 2012-04-24: 08:00:00 via INTRAVENOUS

## 2012-04-24 MED ORDER — MIDAZOLAM HCL 2 MG/2ML IJ SOLN
INTRAMUSCULAR | Status: AC
  Filled 2012-04-24: qty 4

## 2012-04-24 MED ORDER — MIDAZOLAM HCL 5 MG/5ML IJ SOLN
INTRAMUSCULAR | Status: AC | PRN
  Administered 2012-04-24: 0.5 mg via INTRAVENOUS
  Administered 2012-04-24: 1 mg via INTRAVENOUS
  Administered 2012-04-24 (×3): 0.5 mg via INTRAVENOUS

## 2012-04-24 MED ORDER — HYDROCODONE-ACETAMINOPHEN 5-325 MG PO TABS
1.5000 | ORAL_TABLET | Freq: Four times a day (QID) | ORAL | Status: DC | PRN
  Administered 2012-04-24: 1.5 via ORAL
  Filled 2012-04-24: qty 2

## 2012-04-24 MED ORDER — CIPROFLOXACIN IN D5W 400 MG/200ML IV SOLN
400.0000 mg | Freq: Once | INTRAVENOUS | Status: AC
  Administered 2012-04-24: 400 mg via INTRAVENOUS
  Filled 2012-04-24: qty 200

## 2012-04-24 MED ORDER — FENTANYL CITRATE 0.05 MG/ML IJ SOLN
INTRAMUSCULAR | Status: AC
  Filled 2012-04-24: qty 4

## 2012-04-24 NOTE — H&P (Signed)
Laurie Sparks is an 71 y.o. female.   Chief Complaint: Common Bile duct stone; obstructive jaundice  PTC and biliary drain placed 03/27/12 Int/Ext placed 03/31/12 Sepsis and jaundice has resolved Scheduled now for CBD dilitation and push through of stone/ retrieval; possible stent placement HPI: CAD; CHF  Past Medical History  Diagnosis Date  . Coronary artery disease   . Myocardial infarct   . CHF (congestive heart failure)     Past Surgical History  Procedure Date  . Cholecystectomy   . Abdominal hysterectomy   . Ankle surgery     No family history on file. Social History:  reports that she has never smoked. She has never used smokeless tobacco. She reports that she does not drink alcohol or use illicit drugs.  Allergies:  Allergies  Allergen Reactions  . Ambien (Zolpidem Tartrate) Other (See Comments)    Hallucinate   . Other     Base metal and something else     (Not in a hospital admission)  Results for orders placed during the hospital encounter of 04/24/12 (from the past 48 hour(s))  CBC WITH DIFFERENTIAL     Status: Abnormal   Collection Time   04/24/12  7:58 AM      Component Value Range Comment   WBC 6.5  4.0 - 10.5 K/uL    RBC 3.89  3.87 - 5.11 MIL/uL    Hemoglobin 12.2  12.0 - 15.0 g/dL    HCT 16.1  09.6 - 04.5 %    MCV 99.0  78.0 - 100.0 fL    MCH 31.4  26.0 - 34.0 pg    MCHC 31.7  30.0 - 36.0 g/dL    RDW 40.9  81.1 - 91.4 %    Platelets 287  150 - 400 K/uL    Neutrophils Relative 66  43 - 77 %    Neutro Abs 4.3  1.7 - 7.7 K/uL    Lymphocytes Relative 19  12 - 46 %    Lymphs Abs 1.3  0.7 - 4.0 K/uL    Monocytes Relative 13 (*) 3 - 12 %    Monocytes Absolute 0.8  0.1 - 1.0 K/uL    Eosinophils Relative 2  0 - 5 %    Eosinophils Absolute 0.1  0.0 - 0.7 K/uL    Basophils Relative 0  0 - 1 %    Basophils Absolute 0.0  0.0 - 0.1 K/uL    No results found.  Review of Systems  Constitutional: Positive for weight loss. Negative for fever and chills.    Respiratory: Negative for shortness of breath.   Cardiovascular: Negative for chest pain.  Gastrointestinal: Negative for nausea, vomiting and abdominal pain.  Neurological: Positive for weakness. Negative for headaches.    Blood pressure 136/70, pulse 100, temperature 97.7 F (36.5 C), temperature source Oral, resp. rate 16, height 5\' 11"  (1.803 m), weight 86 lb (39.009 kg), SpO2 94.00%. Physical Exam  Constitutional: She is oriented to person, place, and time.  Cardiovascular: Normal rate, regular rhythm and normal heart sounds.   No murmur heard. Respiratory: Effort normal and breath sounds normal. She has no wheezes.  GI: Soft. Bowel sounds are normal. There is no tenderness.  Musculoskeletal: Normal range of motion.       Very thin; frail Bili drain intact  Neurological: She is alert and oriented to person, place, and time.  Skin: Skin is warm and dry.  Psychiatric: She has a normal mood and affect. Her behavior is  normal. Judgment and thought content normal.     Assessment/Plan CBD stone; obstructive jaundice Sepsis and jaundice resolved Existing I/E bili drain Scheduled now for CBD dilitation with stone retrieval/push through Possible stent placement Pt aware of procedure benefits and risks and agreeable to proceed. Consent signed and in chart  Yuval Rubens A 04/24/2012, 8:36 AM

## 2012-04-24 NOTE — Procedures (Signed)
CBD dil 10 mm Stone expulsion 10 Fr Bil cath exchange No comp

## 2012-04-24 NOTE — ED Notes (Signed)
In nurses station, family present. States abd pain 7/10 with no distress.

## 2012-04-27 ENCOUNTER — Telehealth (HOSPITAL_COMMUNITY): Payer: Self-pay | Admitting: *Deleted

## 2012-04-27 NOTE — Telephone Encounter (Signed)
Post procedure phone call message left on identified answering machine.

## 2012-04-30 ENCOUNTER — Telehealth (HOSPITAL_COMMUNITY): Payer: Self-pay

## 2012-04-30 NOTE — Telephone Encounter (Signed)
I spoke with Laurie Sparks.  He had a good understanding of the apt date and time

## 2012-05-04 ENCOUNTER — Other Ambulatory Visit: Payer: Self-pay | Admitting: Radiology

## 2012-05-05 ENCOUNTER — Ambulatory Visit (HOSPITAL_COMMUNITY)
Admission: RE | Admit: 2012-05-05 | Discharge: 2012-05-05 | Payer: Medicare Other | Source: Ambulatory Visit | Attending: Physician Assistant | Admitting: Physician Assistant

## 2012-05-05 ENCOUNTER — Other Ambulatory Visit: Payer: Self-pay | Admitting: Physician Assistant

## 2012-05-05 ENCOUNTER — Ambulatory Visit (HOSPITAL_COMMUNITY)
Admission: RE | Admit: 2012-05-05 | Discharge: 2012-05-05 | Disposition: A | Discharge status: Home / Self Care | Payer: Medicare Other | Source: Ambulatory Visit | Attending: Physician Assistant | Admitting: Physician Assistant

## 2012-05-05 ENCOUNTER — Encounter (HOSPITAL_COMMUNITY): Payer: Self-pay

## 2012-05-05 DIAGNOSIS — K8051 Calculus of bile duct without cholangitis or cholecystitis with obstruction: Secondary | ICD-10-CM | POA: Insufficient documentation

## 2012-05-05 DIAGNOSIS — K8020 Calculus of gallbladder without cholecystitis without obstruction: Secondary | ICD-10-CM

## 2012-05-05 DIAGNOSIS — K831 Obstruction of bile duct: Secondary | ICD-10-CM | POA: Insufficient documentation

## 2012-05-05 HISTORY — DX: Depression, unspecified: F32.A

## 2012-05-05 HISTORY — DX: Major depressive disorder, single episode, unspecified: F32.9

## 2012-05-05 HISTORY — DX: Gastro-esophageal reflux disease without esophagitis: K21.9

## 2012-05-05 HISTORY — DX: Reserved for inherently not codable concepts without codable children: IMO0001

## 2012-05-05 HISTORY — DX: Chronic obstructive pulmonary disease, unspecified: J44.9

## 2012-05-05 HISTORY — DX: Insomnia, unspecified: G47.00

## 2012-05-05 LAB — CBC WITH DIFFERENTIAL/PLATELET
Basophils Relative: 0 % (ref 0–1)
HCT: 35.9 % — ABNORMAL LOW (ref 36.0–46.0)
Hemoglobin: 11.4 g/dL — ABNORMAL LOW (ref 12.0–15.0)
Lymphocytes Relative: 16 % (ref 12–46)
MCHC: 31.8 g/dL (ref 30.0–36.0)
MCV: 97.8 fL (ref 78.0–100.0)
Monocytes Absolute: 0.9 10*3/uL (ref 0.1–1.0)
Monocytes Relative: 13 % — ABNORMAL HIGH (ref 3–12)
Neutro Abs: 4.8 10*3/uL (ref 1.7–7.7)

## 2012-05-05 LAB — COMPREHENSIVE METABOLIC PANEL
BUN: 10 mg/dL (ref 6–23)
CO2: 30 mEq/L (ref 19–32)
Chloride: 101 mEq/L (ref 96–112)
Creatinine, Ser: 0.4 mg/dL — ABNORMAL LOW (ref 0.50–1.10)
GFR calc non Af Amer: >90 mL/min (ref >90)
Total Bilirubin: 0.5 mg/dL (ref 0.3–1.2)

## 2012-05-05 LAB — PROTIME-INR
INR: 1.03 (ref 0.00–1.49)
Prothrombin Time: 13.4 seconds (ref 11.6–15.2)

## 2012-05-05 LAB — APTT: aPTT: 33 seconds (ref 24–37)

## 2012-05-05 MED ORDER — MORPHINE SULFATE 4 MG/ML IJ SOLN: 2.0000 mg | INTRAMUSCULAR | Status: DC | PRN

## 2012-05-05 MED ORDER — MORPHINE SULFATE 4 MG/ML IJ SOLN
INTRAMUSCULAR | Status: AC
  Administered 2012-05-05: 1 mg
  Filled 2012-05-05: qty 1

## 2012-05-05 MED ORDER — MIDAZOLAM HCL 2 MG/2ML IJ SOLN
INTRAMUSCULAR | Status: AC
  Filled 2012-05-05: qty 6

## 2012-05-05 MED ORDER — FENTANYL CITRATE 0.05 MG/ML IJ SOLN
INTRAMUSCULAR | Status: AC
  Filled 2012-05-05: qty 6

## 2012-05-05 MED ORDER — SODIUM CHLORIDE 0.9 % IV SOLN
INTRAVENOUS | Status: DC
  Administered 2012-05-05: 12:00:00 via INTRAVENOUS

## 2012-05-05 MED ORDER — MORPHINE SULFATE 2 MG/ML IJ SOLN: 1.0000 mg | INTRAMUSCULAR | Status: DC | PRN

## 2012-05-05 MED ORDER — ONDANSETRON HCL 4 MG/2ML IJ SOLN
4.0000 mg | Freq: Once | INTRAMUSCULAR | Status: AC
  Administered 2012-05-05: 4 mg via INTRAVENOUS
  Filled 2012-05-05: qty 2

## 2012-05-05 MED ORDER — CIPROFLOXACIN IN D5W 400 MG/200ML IV SOLN
400.0000 mg | Freq: Once | INTRAVENOUS | Status: AC
  Administered 2012-05-05: 400 mg via INTRAVENOUS
  Filled 2012-05-05: qty 200

## 2012-05-05 MED ORDER — HYDROCODONE-ACETAMINOPHEN 7.5-325 MG PO TABS
1.0000 | ORAL_TABLET | Freq: Four times a day (QID) | ORAL | Status: DC | PRN
  Administered 2012-05-05: 1 via ORAL
  Filled 2012-05-05: qty 1

## 2012-05-05 MED ORDER — FENTANYL CITRATE 0.05 MG/ML IJ SOLN
INTRAMUSCULAR | Status: AC | PRN
  Administered 2012-05-05 (×2): 50 ug via INTRAVENOUS

## 2012-05-05 MED ORDER — MORPHINE SULFATE 4 MG/ML IJ SOLN
INTRAMUSCULAR | Status: AC
  Administered 2012-05-05: 1 mg via INTRAVENOUS
  Filled 2012-05-05: qty 1

## 2012-05-05 MED ORDER — IOHEXOL 300 MG/ML  SOLN
100.0000 mL | Freq: Once | INTRAMUSCULAR | Status: AC | PRN
  Administered 2012-05-05: 20 mL via INTRAVENOUS

## 2012-05-05 MED ORDER — CEFAZOLIN SODIUM 1-5 GM-% IV SOLN
1.0000 g | Freq: Once | INTRAVENOUS | Status: DC
  Filled 2012-05-05 (×2): qty 50

## 2012-05-05 MED ORDER — MIDAZOLAM HCL 5 MG/5ML IJ SOLN
INTRAMUSCULAR | Status: AC | PRN
  Administered 2012-05-05: 2 mg via INTRAVENOUS
  Administered 2012-05-05: 1 mg via INTRAVENOUS

## 2012-05-05 NOTE — H&P (Signed)
Laurie Sparks is an 71 y.o. female.    Chief Complaint: Common Bile duct stone; obstructive jaundice s/p PTC and biliary drain placed 03/27/12; Int/Ext placed 03/31/12 with resolution of sepsis and jaundice.  Scheduled now for CBD dilitation and push through of stone/ retrieval; possible stent placement- attempted last on 04/14/12 with stone expulsion.     HPI: Patient returns today for repeat cholangiogram, possible further stone expulsion, possible pta and stent placement with drain exchange or removal depending on fluoroscopic findings on cholangiogram.   Past Medical History  Diagnosis Date  . Coronary artery disease   . Myocardial infarct   . CHF (congestive heart failure)   . COPD (chronic obstructive pulmonary disease)   . Depression   . Insomnia   . Reflux     Past Surgical History  Procedure Date  . Cholecystectomy   . Abdominal hysterectomy   . Ankle surgery     Social History:  reports that she has never smoked. She has never used smokeless tobacco. She reports that she does not drink alcohol or use illicit drugs.  Allergies:  Allergies  Allergen Reactions  . Ambien (Zolpidem Tartrate) Other (See Comments)    Hallucinate   . Ativan (Lorazepam) Other (See Comments)    Hallucinations   . Other     Base metal and something else   Recent Studies :     Clinical Data/Indication: COMMON BILE DUCT STONE.  LARGE HIATAL HERNIA.   CHOLANGIOGRAM VIA EXISTING CATHETER,BILIARY DILATION  ,IR CATHETER TUBE CHANGE   Sedation: Versed three mg, Fentanyl 200 mg.   Total Moderate Sedation Time: 50 minutes.   Contrast Volume: 50 ml Omnipaque-300.   Additional Medications: Ciprofloxacin 4 mg IV.   Fluoroscopy Time: 12.2 minutes.   Procedure: The procedure, risks, benefits, and alternatives were explained to the patient. Questions regarding the procedure were encouraged and answered. The patient understands and consents to the procedure.   The epigastric region was prepped  with betadine in a sterile fashion, and a sterile drape was applied covering the operative field. A sterile gown and sterile gloves were used for the procedure.   The existing catheter in the biliary system was also prepped and draped.  1% lidocaine was utilized for local infiltration. Contrast was injected into the catheter.   The catheter was cut and exchanged over a 260 cm Amplatz wire for a long 9-French sheath.  Sheath cholangiography was performed demonstrating multiple common bile duct stones, including a large stone.   The common bile duct was dilated to 10 mm.  The 11.5 mm occlusion balloon was then utilized to push the stones antegrade into the duodenum.  This was repeated several times.  Final imaging demonstrates patency and near complete resolution of the stones in the common bile duct.  The large stone may still be embedded in the distal common bile duct.   The sheath was exchanged over the wire for a 10-French biliary drain.  The tip is coiled in the duodenum.  Was looped and string fixed.  Contrast was injected.   Findings: Cholangiography confirms multiple stones in the common bile duct and occlusion of the distal common bile duct.   After dilatation of the common bile duct 10 mm, there was marked improvement in the narrowing and region of stricture.  Antegrade flow of contrast into the duodenum was noted.  Final cholangiography demonstrates near complete resolution of the stones.   Final image demonstrates replacement of the biliary drain placed in the duodenum.  Complications: None.   IMPRESSION: Successful common bile duct dilatation and stone expulsion as described.  The patient will return in 2-3 weeks for repeat cholangiogram and possible drain removal.     Original Report Authenticated By: Donavan Burnet, M.D.     Results for orders placed during the hospital encounter of 05/05/12 (from the past 48 hour(s))  APTT     Status: Normal    Collection Time   05/05/12 10:46 AM      Component Value Range Comment   aPTT 33  24 - 37 seconds   CBC WITH DIFFERENTIAL     Status: Abnormal   Collection Time   05/05/12 10:46 AM      Component Value Range Comment   WBC 6.8  4.0 - 10.5 K/uL    RBC 3.67 (*) 3.87 - 5.11 MIL/uL    Hemoglobin 11.4 (*) 12.0 - 15.0 g/dL    HCT 40.9 (*) 81.1 - 46.0 %    MCV 97.8  78.0 - 100.0 fL    MCH 31.1  26.0 - 34.0 pg    MCHC 31.8  30.0 - 36.0 g/dL    RDW 91.4  78.2 - 95.6 %    Platelets 270  150 - 400 K/uL    Neutrophils Relative 70  43 - 77 %    Neutro Abs 4.8  1.7 - 7.7 K/uL    Lymphocytes Relative 16  12 - 46 %    Lymphs Abs 1.1  0.7 - 4.0 K/uL    Monocytes Relative 13 (*) 3 - 12 %    Monocytes Absolute 0.9  0.1 - 1.0 K/uL    Eosinophils Relative 2  0 - 5 %    Eosinophils Absolute 0.1  0.0 - 0.7 K/uL    Basophils Relative 0  0 - 1 %    Basophils Absolute 0.0  0.0 - 0.1 K/uL   COMPREHENSIVE METABOLIC PANEL     Status: Abnormal   Collection Time   05/05/12 10:46 AM      Component Value Range Comment   Sodium 139  135 - 145 mEq/L    Potassium 5.5 (*) 3.5 - 5.1 mEq/L HEMOLYZED SPECIMEN, RESULTS MAY BE AFFECTED   Chloride 101  96 - 112 mEq/L    CO2 30  19 - 32 mEq/L    Glucose, Bld 86  70 - 99 mg/dL    BUN 10  6 - 23 mg/dL    Creatinine, Ser 2.13 (*) 0.50 - 1.10 mg/dL    Calcium 9.4  8.4 - 08.6 mg/dL    Total Protein 6.6  6.0 - 8.3 g/dL    Albumin 3.3 (*) 3.5 - 5.2 g/dL    AST 49 (*) 0 - 37 U/L    ALT 21  0 - 35 U/L    Alkaline Phosphatase 100  39 - 117 U/L    Total Bilirubin 0.5  0.3 - 1.2 mg/dL    GFR calc non Af Amer >90  >90 mL/min    GFR calc Af Amer >90  >90 mL/min   PROTIME-INR     Status: Normal   Collection Time   05/05/12 10:46 AM      Component Value Range Comment   Prothrombin Time 13.4  11.6 - 15.2 seconds    INR 1.03  0.00 - 1.49     Review of Systems  Constitutional: Positive for malaise/fatigue. Negative for fever and chills.  Respiratory: Positive for shortness of  breath and wheezing. Negative  for cough, hemoptysis and sputum production.   Cardiovascular: Positive for chest pain.  Gastrointestinal: Positive for heartburn, nausea and abdominal pain. Negative for vomiting.  Skin: Negative.   Neurological: Positive for weakness. Negative for dizziness, sensory change, focal weakness, seizures and loss of consciousness.  Psychiatric/Behavioral: The patient is nervous/anxious.    Physical Exam  Constitutional: She is oriented to person, place, and time. She appears well-nourished. No distress.       Thin, frail appearing   HENT:  Head: Normocephalic and atraumatic.  Eyes:       Without evidence of jaundice   Cardiovascular: Normal rate and regular rhythm.  Exam reveals no gallop and no friction rub.   No murmur heard. Respiratory: Effort normal. No respiratory distress. She has no wheezes.       Diminished BS bilaterally but CTA   GI: Soft. She exhibits no distension and no mass. There is tenderness.       Biliary drain intact with clean insertion site. Green tinged bile in gravity bag.   Neurological: She is alert and oriented to person, place, and time.  Skin: Skin is warm and dry.  Psychiatric: She has a normal mood and affect. Her behavior is normal. Judgment and thought content normal.     Assessment/Plan Procedure details reviewed with patient today again along with her son.  Will first perform cholangiogram to define anatomy and presence of any residual stones, angioplasty/sphincertotomy as needed with possible stent placement with drain exchange or possible drain removal. They are aware procedure type dependant upon cholangiogram findings. Patient's bilirubin has remained stable with significant output through her bag. Trial capping of her external drain also discussed and their questions answered to their satisfaction. Written consent obtained.   Lyndell Gillyard D 05/05/2012, 12:22 PM

## 2012-05-05 NOTE — Procedures (Signed)
CBD dilatation and stone expulsion Dil to 12 mm Final cholangiogram shows patencty. No comp

## 2012-05-05 NOTE — Progress Notes (Signed)
Right SL picc d/c'd without difficulty, 35cm, no bleeding noted, site clean dry intact, pressure drsg applied, intructed to llie down for 30 min.

## 2012-05-05 NOTE — ED Notes (Signed)
Requested bed from Short Stay

## 2012-05-06 ENCOUNTER — Other Ambulatory Visit: Payer: Self-pay | Admitting: Physician Assistant

## 2012-05-06 ENCOUNTER — Other Ambulatory Visit (HOSPITAL_COMMUNITY): Payer: Self-pay | Admitting: Interventional Radiology

## 2012-05-06 DIAGNOSIS — K8020 Calculus of gallbladder without cholecystitis without obstruction: Secondary | ICD-10-CM

## 2012-05-07 ENCOUNTER — Telehealth (HOSPITAL_COMMUNITY): Payer: Self-pay | Admitting: *Deleted

## 2012-05-07 NOTE — Telephone Encounter (Signed)
Radiology post procedure phone call attempted.  Message left on identified answering machine.  Encouraged to call for any problems or questions.

## 2012-05-12 ENCOUNTER — Other Ambulatory Visit: Payer: Self-pay | Admitting: Radiology

## 2012-05-13 ENCOUNTER — Other Ambulatory Visit: Payer: Self-pay | Admitting: Physician Assistant

## 2012-05-14 ENCOUNTER — Telehealth: Payer: Self-pay | Admitting: Physician Assistant

## 2012-05-14 NOTE — Telephone Encounter (Signed)
Talked with son in detail - patient is still experience pain - anticipated for return 10/9 for re-evaluation of her biliary tract.  Patient is out of pain medication and her primary MD is Out of office.  Called in refill of norco 7.5/325 #50 tabs 1tb q 4-6 hours with no refills for this patient which should certainly cover her until her follow up procedure and then also for a few days post procedure.

## 2012-05-20 ENCOUNTER — Ambulatory Visit (HOSPITAL_COMMUNITY)
Admission: RE | Admit: 2012-05-20 | Discharge: 2012-05-20 | Disposition: A | Discharge status: Home / Self Care | Payer: Medicare Other | Source: Ambulatory Visit | Attending: Interventional Radiology | Admitting: Interventional Radiology

## 2012-05-20 ENCOUNTER — Other Ambulatory Visit (HOSPITAL_COMMUNITY): Payer: Self-pay | Admitting: Interventional Radiology

## 2012-05-20 VITALS — BP 140/82 | HR 79 | Temp 98.5°F | Resp 12 | Ht 61.0 in | Wt 88.0 lb

## 2012-05-20 DIAGNOSIS — J4489 Other specified chronic obstructive pulmonary disease: Secondary | ICD-10-CM | POA: Insufficient documentation

## 2012-05-20 DIAGNOSIS — R110 Nausea: Secondary | ICD-10-CM | POA: Insufficient documentation

## 2012-05-20 DIAGNOSIS — K8020 Calculus of gallbladder without cholecystitis without obstruction: Secondary | ICD-10-CM

## 2012-05-20 DIAGNOSIS — J449 Chronic obstructive pulmonary disease, unspecified: Secondary | ICD-10-CM | POA: Insufficient documentation

## 2012-05-20 DIAGNOSIS — Z4682 Encounter for fitting and adjustment of non-vascular catheter: Secondary | ICD-10-CM | POA: Insufficient documentation

## 2012-05-20 DIAGNOSIS — R109 Unspecified abdominal pain: Secondary | ICD-10-CM | POA: Insufficient documentation

## 2012-05-20 LAB — COMPREHENSIVE METABOLIC PANEL
ALT: 19 U/L (ref 0–35)
AST: 32 U/L (ref 0–37)
Alkaline Phosphatase: 83 U/L (ref 39–117)
CO2: 29 mEq/L (ref 19–32)
Chloride: 100 mEq/L (ref 96–112)
GFR calc non Af Amer: >90 mL/min (ref >90)
Potassium: 4.2 mEq/L (ref 3.5–5.1)
Sodium: 137 mEq/L (ref 135–145)
Total Bilirubin: 0.4 mg/dL (ref 0.3–1.2)

## 2012-05-20 LAB — CBC
Platelets: 138 10*3/uL — ABNORMAL LOW (ref 150–400)
RBC: 3.74 MIL/uL — ABNORMAL LOW (ref 3.87–5.11)
WBC: 6.1 10*3/uL (ref 4.0–10.5)

## 2012-05-20 MED ORDER — FENTANYL CITRATE 0.05 MG/ML IJ SOLN
INTRAMUSCULAR | Status: AC
  Filled 2012-05-20: qty 4

## 2012-05-20 MED ORDER — IOHEXOL 300 MG/ML  SOLN
50.0000 mL | Freq: Once | INTRAMUSCULAR | Status: AC | PRN
  Administered 2012-05-20: 10 mL via INTRAVENOUS

## 2012-05-20 MED ORDER — CIPROFLOXACIN IN D5W 400 MG/200ML IV SOLN
400.0000 mg | INTRAVENOUS | Status: AC
  Administered 2012-05-20: 400 mg via INTRAVENOUS
  Filled 2012-05-20: qty 200

## 2012-05-20 MED ORDER — ONDANSETRON HCL 4 MG/2ML IJ SOLN
INTRAMUSCULAR | Status: AC
  Filled 2012-05-20: qty 2

## 2012-05-20 MED ORDER — SODIUM CHLORIDE 0.9 % IV SOLN: Freq: Once | INTRAVENOUS | Status: DC

## 2012-05-20 MED ORDER — FENTANYL CITRATE 0.05 MG/ML IJ SOLN
INTRAMUSCULAR | Status: DC | PRN
  Administered 2012-05-20: 50 ug via INTRAVENOUS
  Administered 2012-05-20 (×2): 25 ug via INTRAVENOUS

## 2012-05-20 MED ORDER — MIDAZOLAM HCL 2 MG/2ML IJ SOLN
INTRAMUSCULAR | Status: AC
  Filled 2012-05-20: qty 4

## 2012-05-20 MED ORDER — ONDANSETRON HCL 4 MG/2ML IJ SOLN
INTRAMUSCULAR | Status: DC | PRN
  Administered 2012-05-20: 4 mg via INTRAVENOUS

## 2012-05-20 MED ORDER — HYDROCODONE-ACETAMINOPHEN 5-325 MG PO TABS
1.0000 | ORAL_TABLET | Freq: Once | ORAL | Status: AC
  Administered 2012-05-20: 1 via ORAL
  Filled 2012-05-20: qty 1

## 2012-05-20 NOTE — H&P (Signed)
Laurie Sparks is an 71 y.o. female.   Chief Complaint: CBD stones with obstruction. Existing external drain in place,  HPI: patient is s/p stone expulsion, biliary dilatation with external drain placement.   Patient continues to have pain and nausea requiring drain to be reattached to relieve her symptoms; therefore has not tolerated soley capped drain since last procedure 2 weeks ago. She presents today for repeat biliary drain injection, possible further stone expulsion and dilatation if imaging indicates necessity.    Past Medical History  Diagnosis Date  . Coronary artery disease   . Myocardial infarct   . CHF (congestive heart failure)   . COPD (chronic obstructive pulmonary disease)   . Depression   . Insomnia   . Reflux     Past Surgical History  Procedure Date  . Cholecystectomy   . Abdominal hysterectomy   . Ankle surgery     Social History:  reports that she has never smoked. She has never used smokeless tobacco. She reports that she does not drink alcohol or use illicit drugs.  Allergies:  Allergies  Allergen Reactions  . Ambien (Zolpidem Tartrate) Other (See Comments)    Hallucinate   . Ativan (Lorazepam) Other (See Comments)    Hallucinations   . Lactose Intolerance (Gi)   . Other     Base metal and something else   Imaging history : *RADIOLOGY REPORT*  Clinical Data/Indication: BILIARY OBSTRUCTION SECONDARY TO COMMON  DUCT STONES. THE PATIENT IS 2 WEEKS STATUS POST BILIARY DUCT  DILATATION AND STONE EXPULSION.  BILIARY T-TUBE REVISION ,GI TUBE INJECTION,IR CATHETER TUBE CHANGE  Sedation: Versed three mg, Fentanyl 100 mg.  Total Moderate Sedation Time: 30 minutes.  Contrast Volume: 40 ml Omnipaque-300.  Additional Medications: Ciprofloxacin 4 mg IV.  Fluoroscopy Time: 7.3 minutes.  Procedure: The procedure, risks, benefits, and alternatives were  explained to the patient. Questions regarding the procedure were  encouraged and answered. The patient  understands and consents to  the procedure.  The epigastric was prepped with betadine in a sterile fashion, and  a sterile drape was applied covering the operative field. A sterile  gown and sterile gloves were used for the procedure.  1% lidocaine was utilized for local infiltration. The existing  biliary drain was removed over an Amplatz wire and exchanged for a  9-French sheath. Sheath cholangiography was performed. This  demonstrated a faint small calculus in the common bile duct and  virtual obstruction of the distal common bile duct.  The distal common duct stricture was dilated to 12 mm. The common  bile duct was swept several times with a 11.5 mm balloon occlusion  device. On the previous visit, this was performed with great  difficulty. Today, this was much easier to perform. The occlusion  balloon was removed. Repeat sheath, angiography demonstrates  resolution of the calculi and patency of the biliary ductal system.  The sheath was then exchanged for a 10-French internal external  biliary drain. Three additional side holes were cut above the  marker. It was attached to gravity drainage and sewn to the skin.  It was string fixed and looped.  Findings: Initial sheath cholangiography demonstrates persistent  occlusion of the common bile duct and common duct stones.  After dilatation to 12 mm and sweeping of the duct, repeat sheath  cholangiography demonstrates wide patency with still some  persistent irregularity of the distal common bile duct.  Final image demonstrates replacement of the internal external  biliary drain with its tip looped  in the duodenum.  Complications: None.  IMPRESSION:  The patient underwent successful stone expulsion and common bile  duct stricture dilatation to 12 mm. Initial cholangiogram  demonstrating persistent obstruction and common duct stones. Final  imaging demonstrates wide patency. And internal external drain  will be placed and the patient  will return in 2 weeks hopefully for  drain removal. This will drain to a gravity bag for 2 hours then  will be capped off.  Original Report Authenticated By: Donavan Burnet, M.D.    Medication List     As of 05/20/2012  1:28 PM    ASK your doctor about these medications         albuterol (5 MG/ML) 0.5% nebulizer solution   Commonly known as: PROVENTIL   Take 0.5 mLs (2.5 mg total) by nebulization 3 (three) times daily as needed for wheezing.      aspirin EC 81 MG tablet   Take 81 mg by mouth daily.      carvedilol 12.5 MG tablet   Commonly known as: COREG   Take 12.5 mg by mouth 2 (two) times daily with a meal.      feeding supplement Liqd   Take 1 Container by mouth 3 (three) times daily between meals.      Gerhardt's butt cream Crea   Apply 1 application topically daily.      HYDROcodone-acetaminophen 7.5-325 MG per tablet   Commonly known as: NORCO   Take 1 tablet by mouth every 4 (four) hours as needed. One tablet every 4 to 6 hours as needed for pain  For pain      ipratropium 0.02 % nebulizer solution   Commonly known as: ATROVENT   Take 2.5 mLs (0.5 mg total) by nebulization 3 (three) times daily as needed for wheezing.      multivitamin with minerals Tabs   Take 1 tablet by mouth daily.      naproxen sodium 220 MG tablet   Commonly known as: ANAPROX   Take 220 mg by mouth 2 (two) times daily as needed. For headaches      nitroGLYCERIN 0.4 MG SL tablet   Commonly known as: NITROSTAT   Place 0.4 mg under the tongue every 5 (five) minutes as needed. For chest pain         Results for orders placed during the hospital encounter of 05/20/12 (from the past 48 hour(s))  APTT     Status: Normal   Collection Time   05/20/12 12:16 PM      Component Value Range Comment   aPTT 32  24 - 37 seconds   CBC     Status: Abnormal   Collection Time   05/20/12 12:16 PM      Component Value Range Comment   WBC 6.1  4.0 - 10.5 K/uL    RBC 3.74 (*) 3.87 - 5.11 MIL/uL      Hemoglobin 11.6 (*) 12.0 - 15.0 g/dL    HCT 16.1 (*) 09.6 - 46.0 %    MCV 96.0  78.0 - 100.0 fL    MCH 31.0  26.0 - 34.0 pg    MCHC 32.3  30.0 - 36.0 g/dL    RDW 04.5  40.9 - 81.1 %    Platelets 138 (*) 150 - 400 K/uL   COMPREHENSIVE METABOLIC PANEL     Status: Abnormal   Collection Time   05/20/12 12:16 PM      Component Value Range Comment   Sodium  137  135 - 145 mEq/L    Potassium 4.2  3.5 - 5.1 mEq/L    Chloride 100  96 - 112 mEq/L    CO2 29  19 - 32 mEq/L    Glucose, Bld 94  70 - 99 mg/dL    BUN 6  6 - 23 mg/dL    Creatinine, Ser 1.61 (*) 0.50 - 1.10 mg/dL    Calcium 8.8  8.4 - 09.6 mg/dL    Total Protein 6.1  6.0 - 8.3 g/dL    Albumin 3.2 (*) 3.5 - 5.2 g/dL    AST 32  0 - 37 U/L    ALT 19  0 - 35 U/L    Alkaline Phosphatase 83  39 - 117 U/L    Total Bilirubin 0.4  0.3 - 1.2 mg/dL    GFR calc non Af Amer >90  >90 mL/min    GFR calc Af Amer >90  >90 mL/min   PROTIME-INR     Status: Normal   Collection Time   05/20/12 12:16 PM      Component Value Range Comment   Prothrombin Time 12.4  11.6 - 15.2 seconds    INR 0.93  0.00 - 1.49    Review of Systems  Constitutional: Positive for malaise/fatigue. Negative for fever, chills and weight loss.  Respiratory: Negative for cough, hemoptysis, sputum production, shortness of breath and wheezing.   Cardiovascular: Negative for chest pain, palpitations, orthopnea and leg swelling.  Gastrointestinal: Positive for heartburn, nausea, vomiting and abdominal pain.  Musculoskeletal: Positive for joint pain.  Skin: Negative.   Neurological: Positive for weakness. Negative for dizziness, focal weakness, seizures and loss of consciousness.  Endo/Heme/Allergies: Negative.   Psychiatric/Behavioral: Positive for depression. Negative for memory loss. The patient has insomnia. The patient is not nervous/anxious.     Blood pressure 158/80, pulse 73, temperature 98.5 F (36.9 C), temperature source Oral, resp. rate 20, height 5\' 1"  (1.549 m),  weight 88 lb (39.917 kg), SpO2 98.00%. Physical Exam  Constitutional: She is oriented to person, place, and time. No distress.       Thin, frail. No evidence of jaundice  HENT:  Head: Normocephalic and atraumatic.  Cardiovascular: Normal rate and regular rhythm.   Respiratory: Effort normal. No respiratory distress. She has no wheezes. She has no rales.       Diminished BS bilaterally but CTA  GI: Soft. Bowel sounds are normal. She exhibits no distension. There is tenderness.       Biliary drain intact with insertion site clean and dry. Tender to palpation along drain site and to RUQ. Drain currently capped off.   Musculoskeletal: Normal range of motion. She exhibits no edema.  Neurological: She is alert and oriented to person, place, and time.  Skin: Skin is warm and dry.  Psychiatric: She has a normal mood and affect. Her behavior is normal. Judgment and thought content normal.     Assessment/Plan Talked with patient in detail regarding possible treatment today. Given that she has not been able to tolerate capping of her drain due to pain and nausea- may suggest further stone obstruction or narrowing requiring angioplasty and continued drainage. Patient understands we will inject the drain to re-evaluate the biliary system, possible stone expulsion if indicated, possible repeat angioplasty if indicated and likely exchange of her drain.  If all of biliary system is open and injection is clear - may possibly remove drain today.  All patient's questions answered to her satisfaction. Written consent  obtained.   CAMPBELL,PAMELA D 05/20/2012, 1:28 PM

## 2012-05-20 NOTE — Procedures (Signed)
No note

## 2012-05-20 NOTE — Procedures (Signed)
Bile duct brush Bx Catheter exchange

## 2012-05-28 ENCOUNTER — Encounter: Payer: Self-pay | Admitting: Infectious Disease

## 2012-05-28 ENCOUNTER — Ambulatory Visit (INDEPENDENT_AMBULATORY_CARE_PROVIDER_SITE_OTHER): Payer: Medicare Other | Admitting: Infectious Disease

## 2012-05-28 VITALS — BP 166/81 | HR 70 | Temp 98.4°F | Wt 89.2 lb

## 2012-05-28 DIAGNOSIS — K8020 Calculus of gallbladder without cholecystitis without obstruction: Secondary | ICD-10-CM

## 2012-05-28 DIAGNOSIS — B952 Enterococcus as the cause of diseases classified elsewhere: Secondary | ICD-10-CM

## 2012-05-28 DIAGNOSIS — K8309 Other cholangitis: Secondary | ICD-10-CM

## 2012-05-28 DIAGNOSIS — R7881 Bacteremia: Secondary | ICD-10-CM

## 2012-05-28 NOTE — Progress Notes (Signed)
  Subjective:    Patient ID: Laurie Sparks, female    DOB: Dec 30, 1940, 71 y.o.   MRN: [id number]  HPI  71 year old admitted with acute cholangitis complicated by polymicrobial bactermia with AMP sensitive Enterococcus (but I to vancomycin) and E. Coli in 2/2 blood cultures on admission and also from biliary material when drain placed. She was treated with 2 weeks of IV Unasyn post clearing of her microbial bacteremia. She then changed to oral Augmentin twice daily. She underwent a dilatation on September 13 and then exchange of her tube approximately a week ago. She is currently off all antibiotics and without fevers chills muscle malaise. She still does have some abdominal discomfort. Review of Systems  Constitutional: Negative for fever, chills, diaphoresis, activity change, appetite change, fatigue and unexpected weight change.  HENT: Negative for congestion, sore throat, rhinorrhea, sneezing, trouble swallowing and sinus pressure.   Eyes: Negative for photophobia and visual disturbance.  Respiratory: Negative for cough, chest tightness, shortness of breath, wheezing and stridor.   Cardiovascular: Negative for chest pain, palpitations and leg swelling.  Gastrointestinal: Positive for abdominal pain. Negative for nausea, vomiting, diarrhea, constipation, blood in stool, abdominal distention and anal bleeding.  Genitourinary: Negative for dysuria, hematuria, flank pain and difficulty urinating.  Musculoskeletal: Negative for myalgias, back pain, joint swelling, arthralgias and gait problem.  Skin: Negative for color change, pallor, rash and wound.  Neurological: Negative for dizziness, tremors, weakness and light-headedness.  Hematological: Negative for adenopathy. Does not bruise/bleed easily.  Psychiatric/Behavioral: Negative for behavioral problems, confusion, disturbed wake/sleep cycle, dysphoric mood, decreased concentration and agitation.       Objective:   Physical Exam  Constitutional:  She is oriented to person, place, and time. No distress.  HENT:  Head: Normocephalic and atraumatic.  Mouth/Throat: Oropharynx is clear and moist. No oropharyngeal exudate.  Eyes: Conjunctivae normal and EOM are normal. Pupils are equal, round, and reactive to light. No scleral icterus.  Neck: Normal range of motion. Neck supple. No JVD present.  Cardiovascular: Normal rate, regular rhythm and normal heart sounds.  Exam reveals no gallop and no friction rub.   No murmur heard. Pulmonary/Chest: Effort normal and breath sounds normal. No respiratory distress. She has no wheezes. She has no rales. She exhibits no tenderness.  Abdominal: She exhibits no distension and no mass. There is tenderness. There is no rebound and no guarding.    Musculoskeletal: She exhibits no edema and no tenderness.  Lymphadenopathy:    She has no cervical adenopathy.  Neurological: She is alert and oriented to person, place, and time. She has normal reflexes. She exhibits normal muscle tone. Coordination normal.  Skin: Skin is warm and dry. She is not diaphoretic. No erythema. No pallor.  Psychiatric: She has a normal mood and affect. Her behavior is normal. Judgment and thought content normal.          Assessment & Plan:  Ampicillin Sensitive Enterococcal and E coli bacteremia due to cholangitis: Sp 2 weeks of IV antibiotics followed by oral abx   Cholangitis: resolved. Observe off antibiotics  Gallstones: had another one extracted, drain in place.

## 2012-06-01 ENCOUNTER — Other Ambulatory Visit: Payer: Self-pay | Admitting: Interventional Radiology

## 2012-06-01 DIAGNOSIS — K831 Obstruction of bile duct: Secondary | ICD-10-CM

## 2012-06-02 ENCOUNTER — Ambulatory Visit
Admission: RE | Admit: 2012-06-02 | Discharge: 2012-06-02 | Disposition: A | Discharge status: Home / Self Care | Payer: Medicare Other | Source: Ambulatory Visit | Attending: Interventional Radiology | Admitting: Interventional Radiology

## 2012-06-02 DIAGNOSIS — K831 Obstruction of bile duct: Secondary | ICD-10-CM

## 2012-06-02 NOTE — Progress Notes (Signed)
Patient reports very minimal drainage via catheter.  Has been flushing daily w/ sterile water.  Afebrile.  Appetite good.  Occasional diarrhea.  Often experience episodes of nausea w/o vomiting.  Taking Rx pain meds 4-5 times daily.    Fatigues easily.  Able to do some housework w/ frequent rest intervals.

## 2012-06-12 ENCOUNTER — Other Ambulatory Visit: Payer: Self-pay | Admitting: Physician Assistant

## 2012-06-12 DIAGNOSIS — Z1231 Encounter for screening mammogram for malignant neoplasm of breast: Secondary | ICD-10-CM

## 2012-07-27 ENCOUNTER — Ambulatory Visit: Payer: Medicare Other

## 2012-08-26 ENCOUNTER — Ambulatory Visit
Admission: RE | Admit: 2012-08-26 | Discharge: 2012-08-26 | Disposition: A | Discharge status: Home / Self Care | Payer: Medicare Other | Source: Ambulatory Visit | Attending: Physician Assistant | Admitting: Physician Assistant

## 2012-08-26 DIAGNOSIS — Z1231 Encounter for screening mammogram for malignant neoplasm of breast: Secondary | ICD-10-CM

## 2012-08-28 ENCOUNTER — Other Ambulatory Visit: Payer: Self-pay | Admitting: Family Medicine

## 2012-08-28 DIAGNOSIS — N63 Unspecified lump in unspecified breast: Secondary | ICD-10-CM

## 2012-08-28 DIAGNOSIS — N644 Mastodynia: Secondary | ICD-10-CM

## 2014-07-04 IMAGING — XA IR CHOLANGIOGRAM VIA EXIST CATHETER
1 series · 12 of 24 positions shown · non-contrast
Comparison: none

CLINICAL HISTORY: 70-year-old with obstructive jaundice.  The
patient currently has an external biliary drain.

[Series 1: run · 12 of 40 slices shown]
[im 2/40]
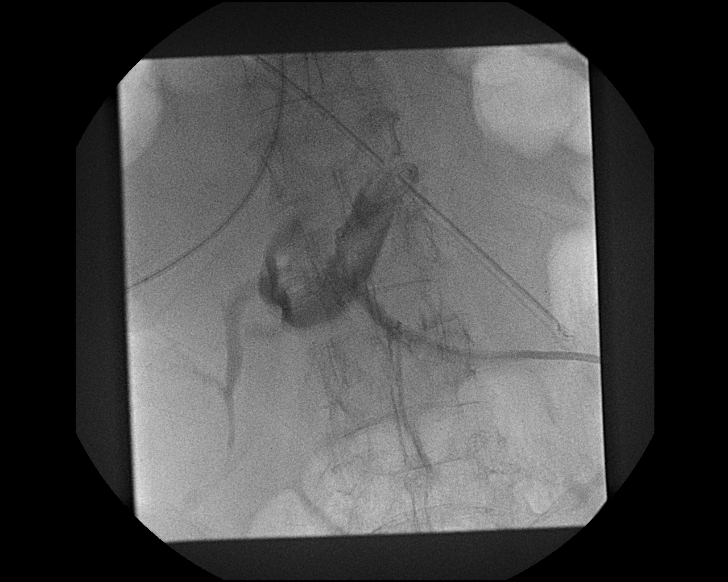
[im 6/40]
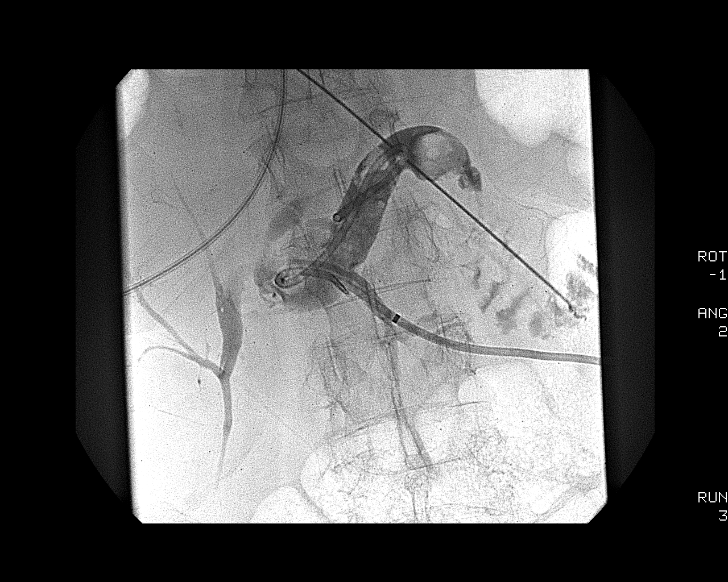
[im 9/40]
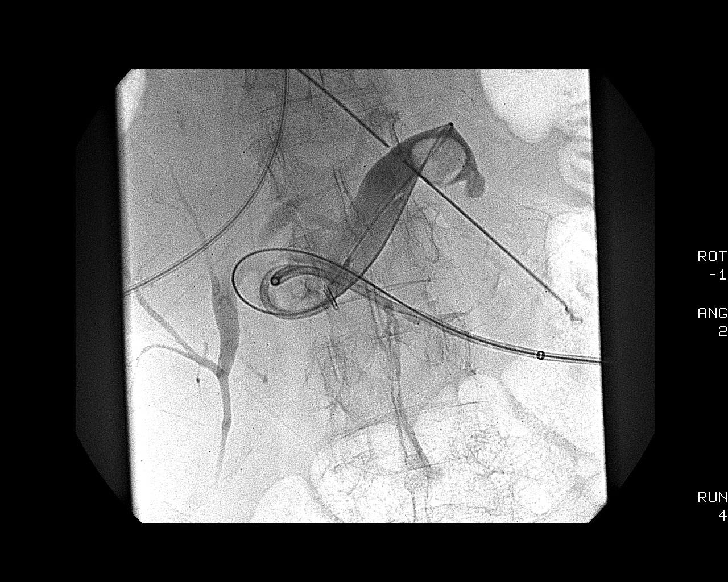
[im 12/40]
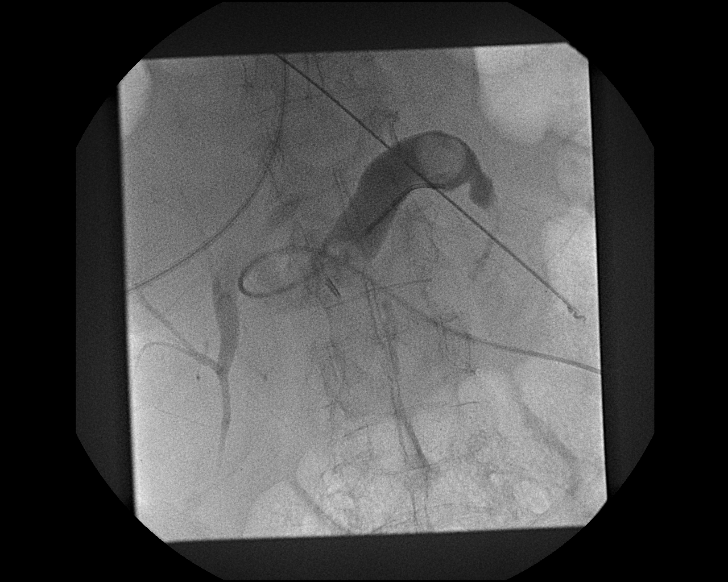
[im 16/40]
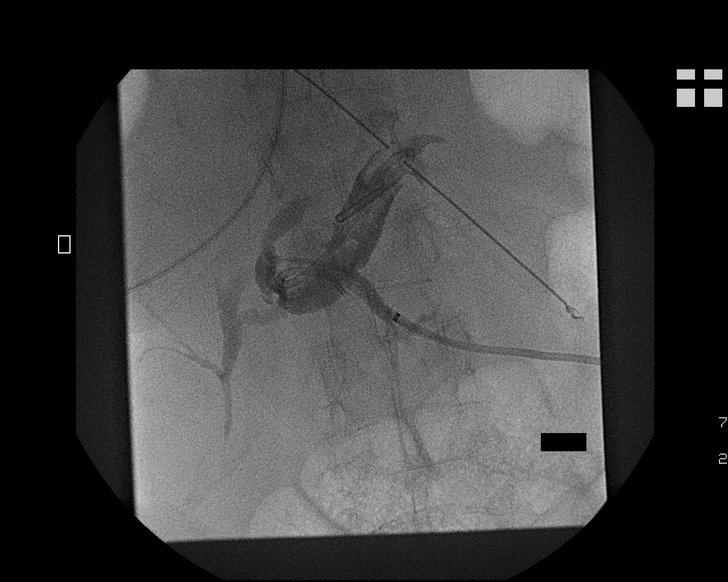
[im 19/40]
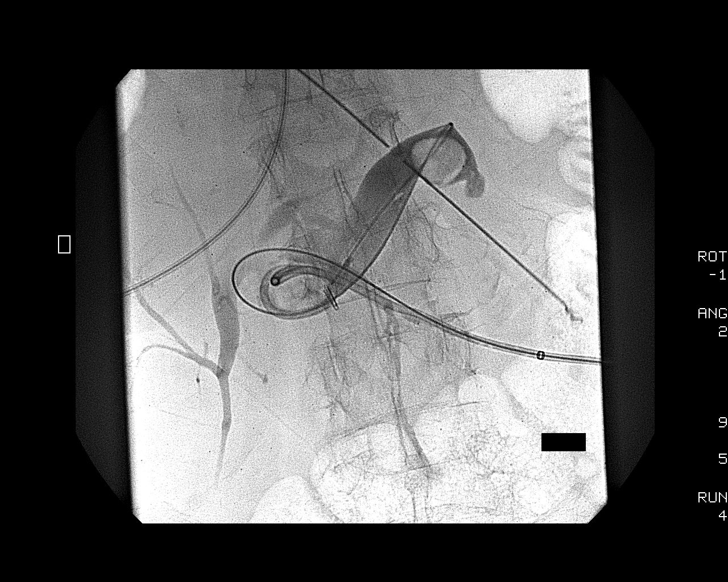
[im 23/40]
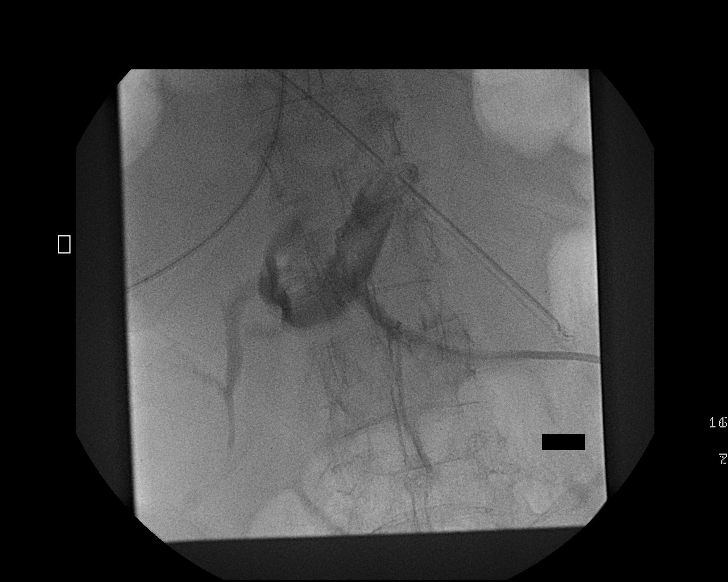
[im 26/40]
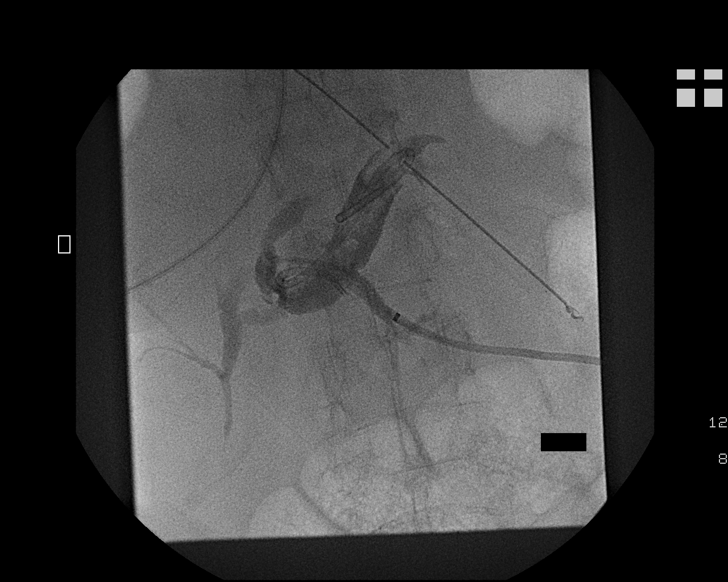
[im 29/40]
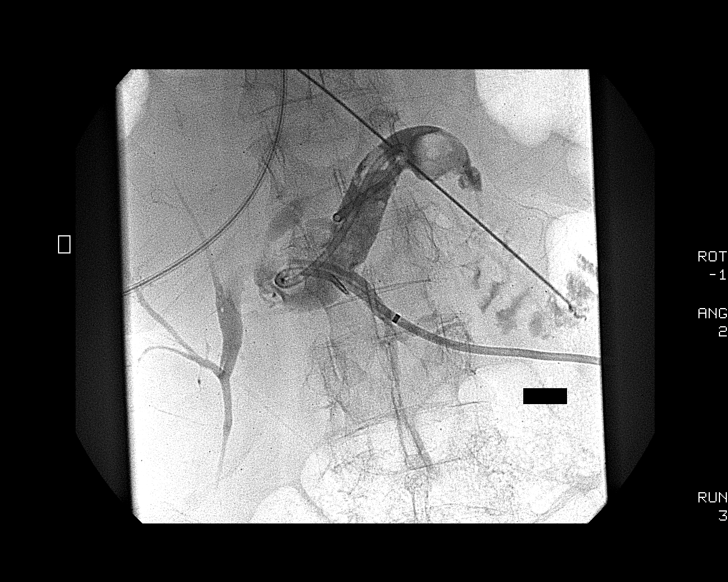
[im 33/40]
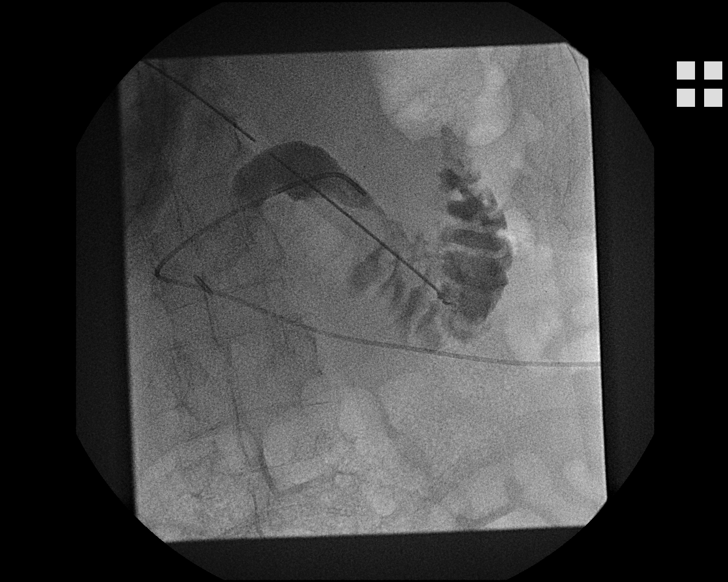
[im 36/40]
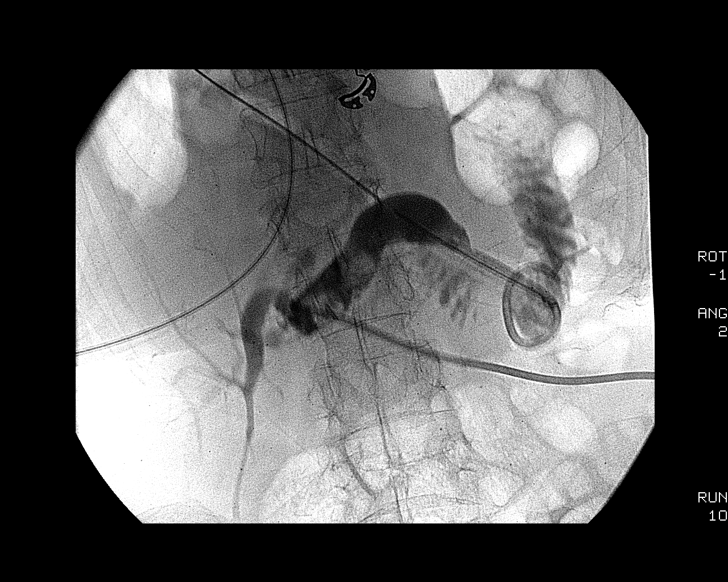
[im 40/40]
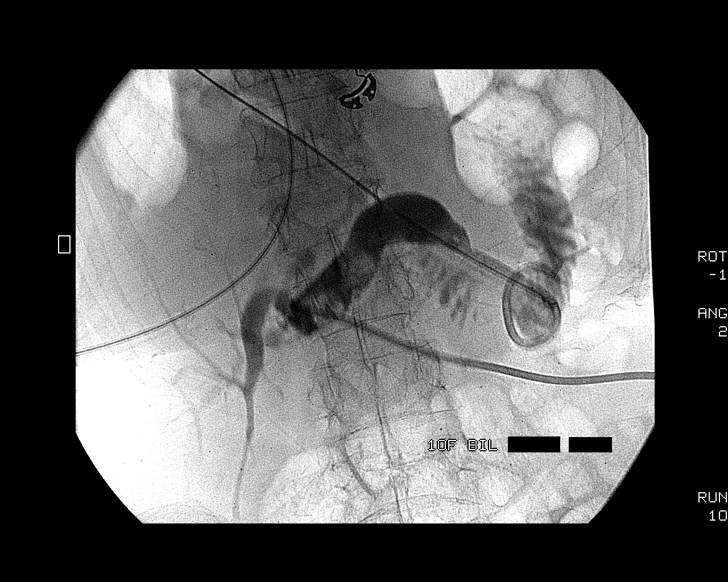

[12 of 24 positions shown; findings below may reference images not displayed]

PROCEDURE(S): CHOLANGIOGRAM THROUGH EXISTING TUBE; PLACEMENT OF
INTERNAL EXTERNAL BILIARY DRAIN

Medications:Versed 2 mg IV

Fluoroscopy time: 5.5 minutes

Contrast:  25 ml Ymnipaque-5XX

Procedure:Informed consent was obtained for a cholangiogram and
internalization of biliary drain.  The patient was also consented
for a possible brush biopsy.  The patient was placed supine on the
interventional table.  The existing biliary drain was prepped and
draped in a sterile fashion.  Maximal barrier sterile technique was
utilized including caps, mask, sterile gowns, sterile gloves,
sterile drape, hand hygiene and skin antiseptic.  Contrast was
injected into the biliary drain.  The biliary catheter was cut and
removed over a Bentson wire.  5-French Kumpe catheter was used to
perform additional cholangiograms.  Catheter was also advanced into
the duodenum.  A Bentson wire was placed in the small bowel and a
10-French internal-external biliary drain was placed over a Bentson
wire.  The distal aspect of the drain was reconstituted in the
small bowel.  Catheter sutured to the skin and attached to a
gravity bag.
FINDINGS: The cholangiogram demonstrated an external biliary drain.
The patient's anatomy is very unusual in that the common bile duct
extends cephalad and is left of midline.  Cholangiogram
demonstrated a very large filling defect in the distal common bile
duct consistent with a large stone.  There are additional filling
defects throughout the biliary system suggestive for additional
stones.  The ampulla is widely patent and contrast is now
spontaneously draining into the small bowel.  An underlying
stricture or lesion was not identified.

Complications: None
IMPRESSION: Successful placement of an internal-external biliary drain.

The cause of the obstructive jaundice is due to biliary stones.
There was a very large stone in the distal common bile duct which
was mobile throughout the procedure.

Plan:  Plan internal-external biliary drainage until the patient
has fully recovered from sepsis and respiratory distress.  Will
need GI's opinion about whether ERCP and stone extraction are
possible due to the patient's abnormal anatomy.  Otherwise, the
patient may be a candidate for percutaneous sphincteroplasty and
stone removal.

## 2014-07-28 IMAGING — XA IR CHOLANGIOGRAM VIA EXIST CATHETER
1 series · 12 of 24 positions shown · non-contrast
Comparison: none

Clinical Data/Indication: COMMON BILE DUCT STONE.  LARGE HIATAL
HERNIA.

CHOLANGIOGRAM VIA EXISTING CATHETER,BILIARY DILATION  ,IR CATHETER
TUBE CHANGE
Sedation: Versed three mg, Fentanyl 200 mg.
Total Moderate Sedation Time: 50 minutes.
Contrast Volume: 50 ml Kmnipaque-QHH.
Additional Medications: Ciprofloxacin 4 mg IV.
Fluoroscopy Time: 12.2 minutes.
Procedure: The procedure, risks, benefits, and alternatives were
explained to the patient. Questions regarding the procedure were
encouraged and answered. The patient understands and consents to
the procedure.
The epigastric region was prepped with betadine in a sterile
fashion, and a sterile drape was applied covering the operative
field. A sterile gown and sterile gloves were used for the
procedure.
The existing catheter in the biliary system was also prepped and
draped.  1% lidocaine was utilized for local infiltration.
Contrast was injected into the catheter.
The catheter was cut and exchanged over a 260 cm Amplatz wire for a
long 9-French sheath.  Sheath cholangiography was performed
demonstrating multiple common bile duct stones, including a large
stone.
The common bile duct was dilated to 10 mm.  The 11.5 mm occlusion
balloon was then utilized to push the stones antegrade into the
duodenum.  This was repeated several times.  Final imaging
demonstrates patency and near complete resolution of the stones in
the common bile duct.  The large stone may still be embedded in the
distal common bile duct.
The sheath was exchanged over the wire for a 10-French biliary
drain.  The tip is coiled in the duodenum.  Was looped and string
fixed.  Contrast was injected.

[Series 1: run · 12 of 32 slices shown]
[im 2/32]
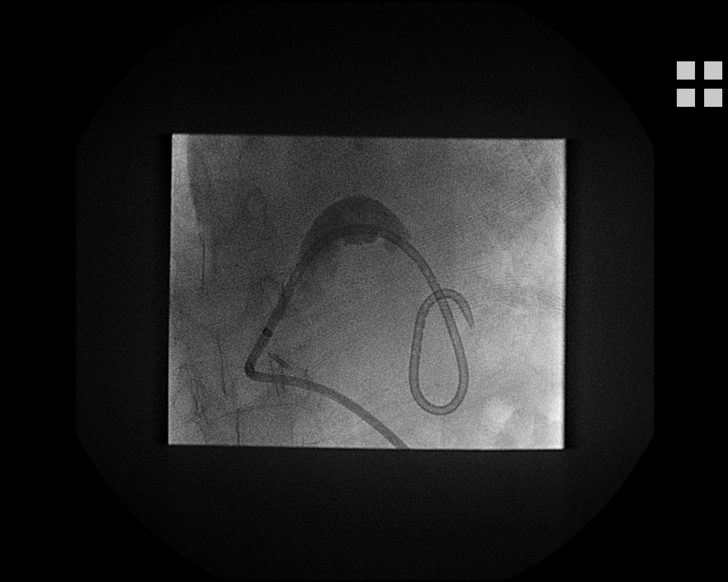
[im 5/32]
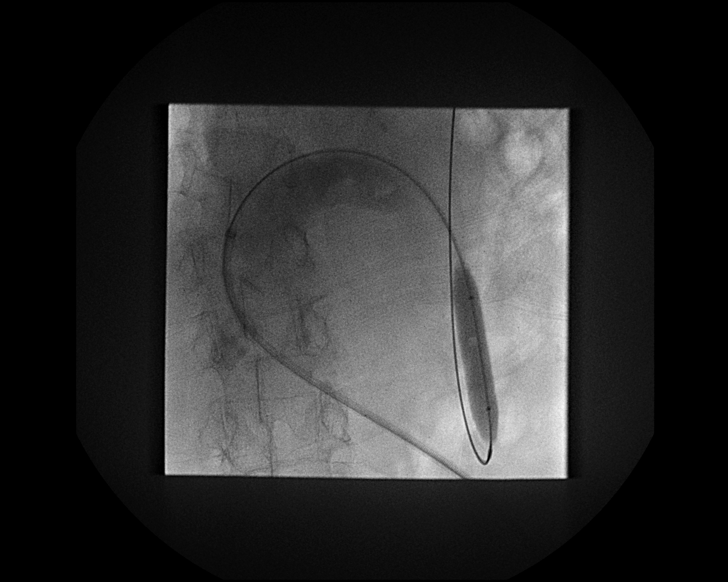
[im 7/32]
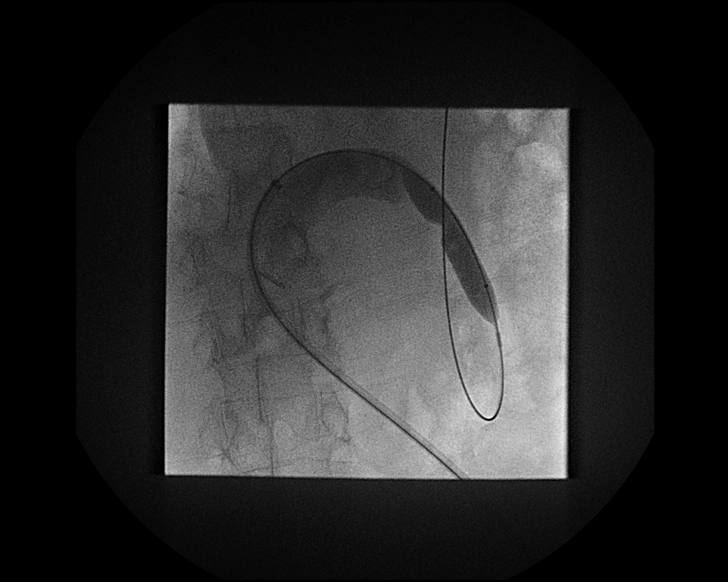
[im 10/32]
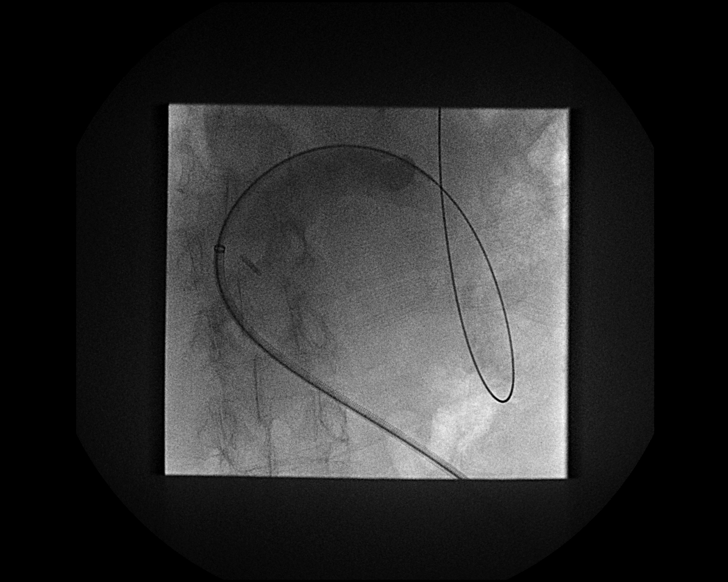
[im 13/32]
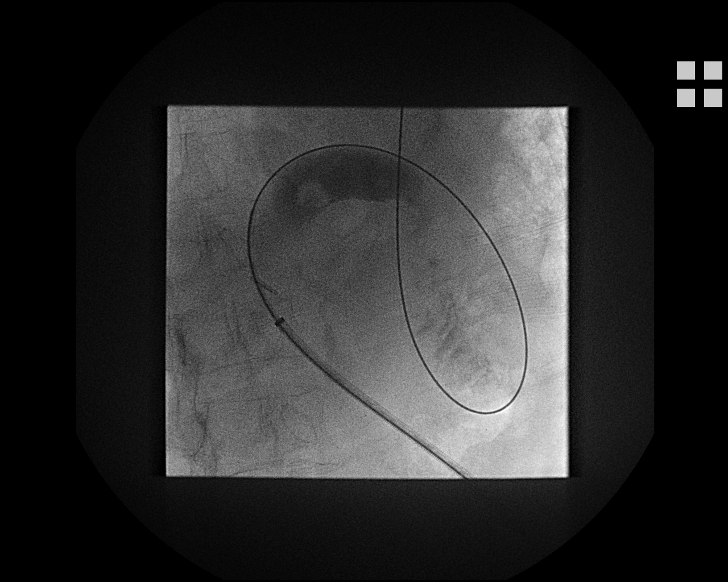
[im 15/32]
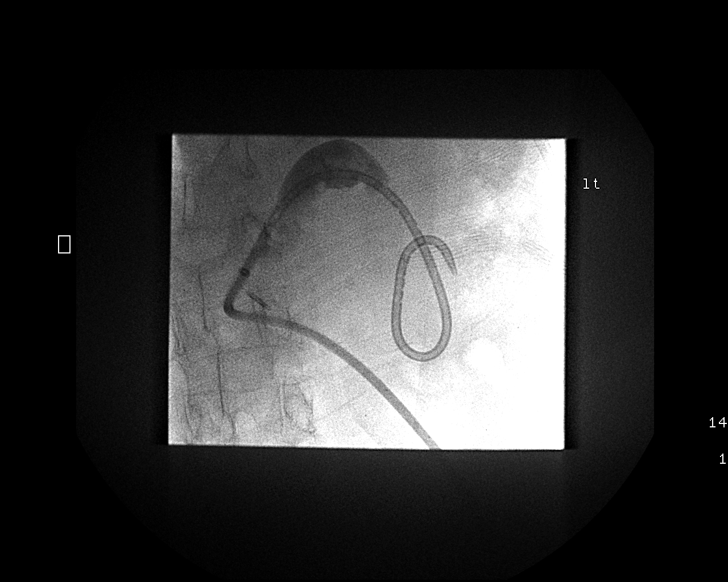
[im 18/32]
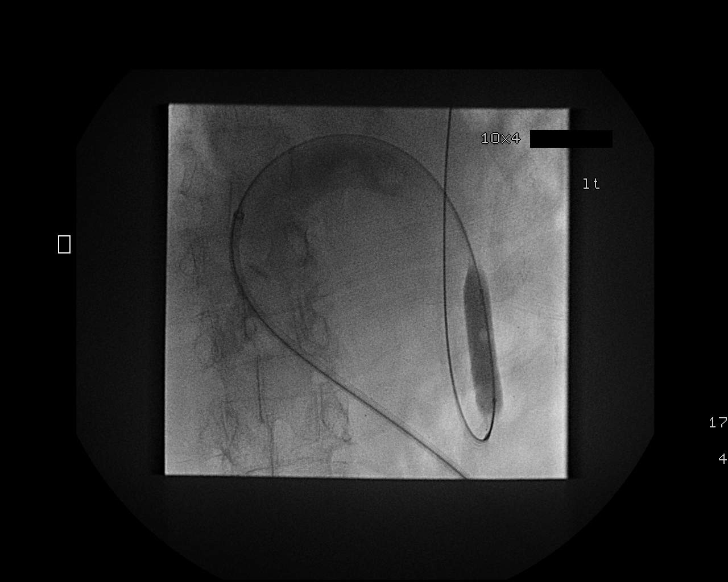
[im 21/32]
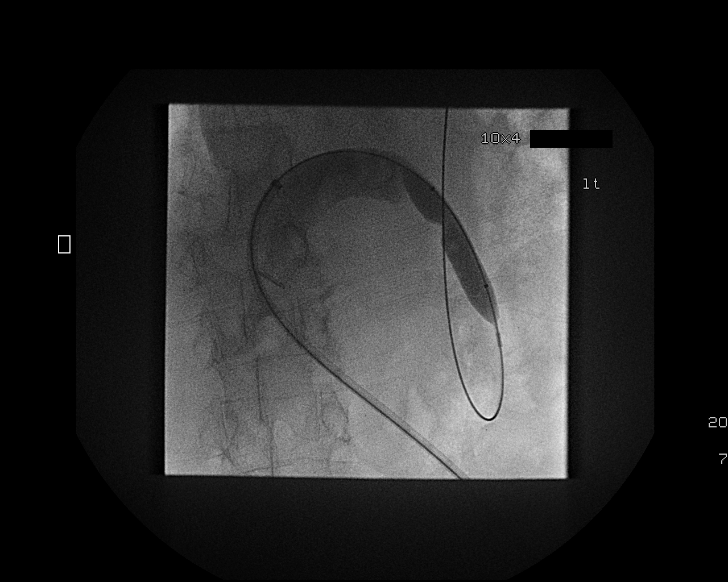
[im 23/32]
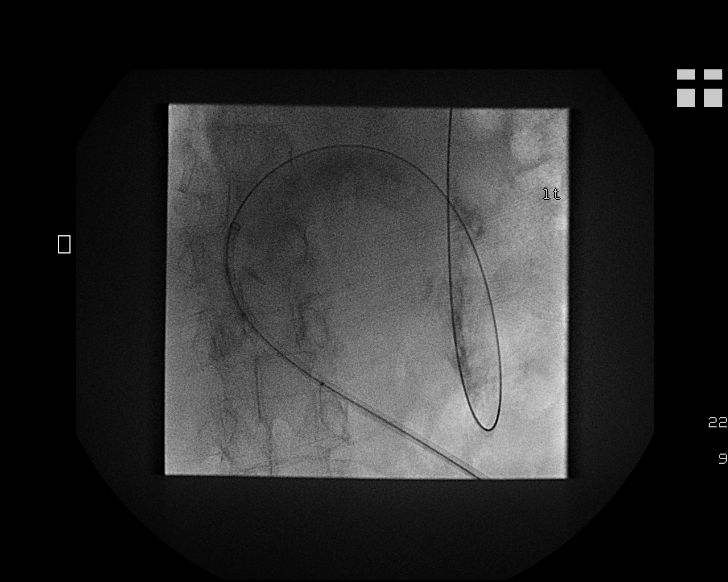
[im 26/32]
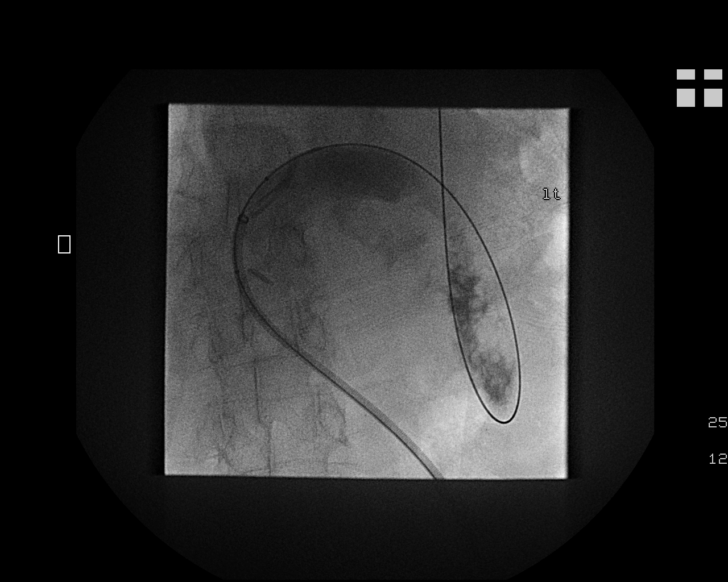
[im 29/32]
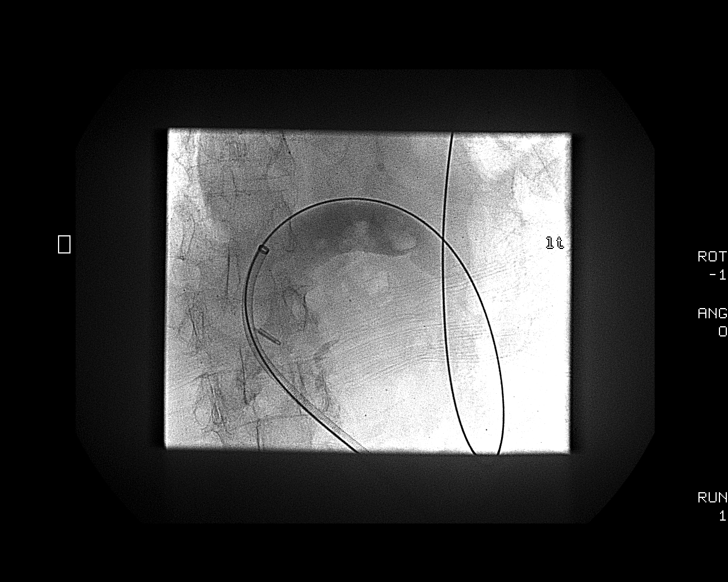
[im 32/32]
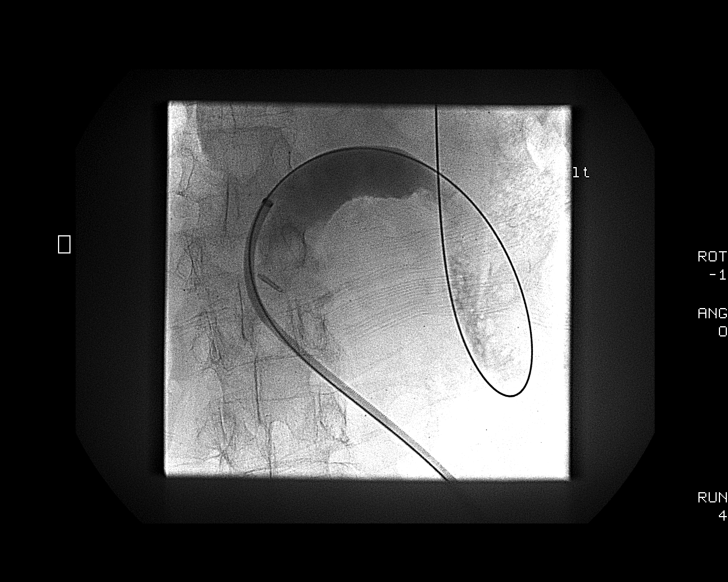

[12 of 24 positions shown; findings below may reference images not displayed]

FINDINGS: Cholangiography confirms multiple stones in the common
bile duct and occlusion of the distal common bile duct.

After dilatation of the common bile duct 10 mm, there was marked
improvement in the narrowing and region of stricture.  Antegrade
flow of contrast into the duodenum was noted.  Final
cholangiography demonstrates near complete resolution of the
stones.

Final image demonstrates replacement of the biliary drain placed in
the duodenum.

Complications: None.
IMPRESSION: Successful common bile duct dilatation and stone expulsion as
described.  The patient will return in 2-3 weeks for repeat
cholangiogram and possible drain removal.

## 2015-03-15 ENCOUNTER — Other Ambulatory Visit: Payer: Self-pay

## 2015-03-15 ENCOUNTER — Other Ambulatory Visit: Payer: Self-pay | Admitting: Family Medicine

## 2015-03-15 DIAGNOSIS — Z1231 Encounter for screening mammogram for malignant neoplasm of breast: Secondary | ICD-10-CM

## 2015-03-21 ENCOUNTER — Ambulatory Visit: Payer: Medicare Other

## 2015-03-21 ENCOUNTER — Other Ambulatory Visit: Payer: Self-pay | Admitting: Family Medicine

## 2015-03-21 DIAGNOSIS — N6310 Unspecified lump in the right breast, unspecified quadrant: Secondary | ICD-10-CM

## 2015-03-30 ENCOUNTER — Ambulatory Visit
Admission: RE | Admit: 2015-03-30 | Discharge: 2015-03-30 | Disposition: A | Discharge status: Home / Self Care | Payer: Medicare Other | Source: Ambulatory Visit | Attending: Family Medicine | Admitting: Family Medicine

## 2015-03-30 DIAGNOSIS — N6310 Unspecified lump in the right breast, unspecified quadrant: Secondary | ICD-10-CM

## 2016-02-23 ENCOUNTER — Emergency Department (HOSPITAL_COMMUNITY): Payer: Medicare Other

## 2016-02-23 ENCOUNTER — Inpatient Hospital Stay (HOSPITAL_COMMUNITY)
Admission: EM | Admit: 2016-02-23 | Discharge: 2016-02-29 | DRG: 291 | Disposition: A | Discharge status: Home Health | Payer: Medicare Other | Attending: Internal Medicine | Admitting: Internal Medicine

## 2016-02-23 ENCOUNTER — Encounter (HOSPITAL_COMMUNITY): Payer: Self-pay | Admitting: Emergency Medicine

## 2016-02-23 DIAGNOSIS — Z79899 Other long term (current) drug therapy: Secondary | ICD-10-CM

## 2016-02-23 DIAGNOSIS — N390 Urinary tract infection, site not specified: Secondary | ICD-10-CM | POA: Diagnosis present

## 2016-02-23 DIAGNOSIS — Z681 Body mass index (BMI) 19 or less, adult: Secondary | ICD-10-CM

## 2016-02-23 DIAGNOSIS — M4854XA Collapsed vertebra, not elsewhere classified, thoracic region, initial encounter for fracture: Secondary | ICD-10-CM | POA: Diagnosis present

## 2016-02-23 DIAGNOSIS — E43 Unspecified severe protein-calorie malnutrition: Secondary | ICD-10-CM | POA: Insufficient documentation

## 2016-02-23 DIAGNOSIS — M4856XA Collapsed vertebra, not elsewhere classified, lumbar region, initial encounter for fracture: Secondary | ICD-10-CM | POA: Diagnosis present

## 2016-02-23 DIAGNOSIS — I509 Heart failure, unspecified: Secondary | ICD-10-CM

## 2016-02-23 DIAGNOSIS — J189 Pneumonia, unspecified organism: Secondary | ICD-10-CM | POA: Diagnosis present

## 2016-02-23 DIAGNOSIS — Z9981 Dependence on supplemental oxygen: Secondary | ICD-10-CM

## 2016-02-23 DIAGNOSIS — I5033 Acute on chronic diastolic (congestive) heart failure: Principal | ICD-10-CM

## 2016-02-23 DIAGNOSIS — E876 Hypokalemia: Secondary | ICD-10-CM

## 2016-02-23 DIAGNOSIS — K219 Gastro-esophageal reflux disease without esophagitis: Secondary | ICD-10-CM | POA: Diagnosis present

## 2016-02-23 DIAGNOSIS — Z888 Allergy status to other drugs, medicaments and biological substances status: Secondary | ICD-10-CM

## 2016-02-23 DIAGNOSIS — J440 Chronic obstructive pulmonary disease with acute lower respiratory infection: Secondary | ICD-10-CM | POA: Diagnosis present

## 2016-02-23 DIAGNOSIS — Z9109 Other allergy status, other than to drugs and biological substances: Secondary | ICD-10-CM

## 2016-02-23 DIAGNOSIS — M40209 Unspecified kyphosis, site unspecified: Secondary | ICD-10-CM | POA: Diagnosis present

## 2016-02-23 DIAGNOSIS — I5031 Acute diastolic (congestive) heart failure: Secondary | ICD-10-CM

## 2016-02-23 DIAGNOSIS — J90 Pleural effusion, not elsewhere classified: Secondary | ICD-10-CM

## 2016-02-23 DIAGNOSIS — I2510 Atherosclerotic heart disease of native coronary artery without angina pectoris: Secondary | ICD-10-CM | POA: Diagnosis present

## 2016-02-23 DIAGNOSIS — R64 Cachexia: Secondary | ICD-10-CM | POA: Diagnosis present

## 2016-02-23 DIAGNOSIS — R0600 Dyspnea, unspecified: Secondary | ICD-10-CM | POA: Diagnosis present

## 2016-02-23 DIAGNOSIS — Z7982 Long term (current) use of aspirin: Secondary | ICD-10-CM

## 2016-02-23 DIAGNOSIS — I252 Old myocardial infarction: Secondary | ICD-10-CM

## 2016-02-23 LAB — URINALYSIS, ROUTINE W REFLEX MICROSCOPIC
GLUCOSE, UA: NEGATIVE mg/dL
HGB URINE DIPSTICK: NEGATIVE
Ketones, ur: 15 mg/dL — AB
Nitrite: NEGATIVE
PH: 6 (ref 5.0–8.0)
PROTEIN: NEGATIVE mg/dL
Specific Gravity, Urine: 1.033 — ABNORMAL HIGH (ref 1.005–1.030)

## 2016-02-23 LAB — I-STAT TROPONIN, ED: TROPONIN I, POC: 0.03 ng/mL (ref 0.00–0.08)

## 2016-02-23 LAB — BASIC METABOLIC PANEL
Anion gap: 12 (ref 5–15)
BUN: 27 mg/dL — AB (ref 6–20)
CALCIUM: 8.8 mg/dL — AB (ref 8.9–10.3)
CHLORIDE: 94 mmol/L — AB (ref 101–111)
CO2: 35 mmol/L — AB (ref 22–32)
CREATININE: 0.72 mg/dL (ref 0.44–1.00)
GFR calc Af Amer: >60 mL/min (ref >60)
GFR calc non Af Amer: >60 mL/min (ref >60)
GLUCOSE: 121 mg/dL — AB (ref 65–99)
Potassium: 2.7 mmol/L — CL (ref 3.5–5.1)
Sodium: 141 mmol/L (ref 135–145)

## 2016-02-23 LAB — HEPATIC FUNCTION PANEL
ALK PHOS: 107 U/L (ref 38–126)
ALT: 16 U/L (ref 14–54)
AST: 30 U/L (ref 15–41)
Albumin: 3.1 g/dL — ABNORMAL LOW (ref 3.5–5.0)
BILIRUBIN DIRECT: 0.2 mg/dL (ref 0.1–0.5)
BILIRUBIN INDIRECT: 0.9 mg/dL (ref 0.3–0.9)
TOTAL PROTEIN: 5.8 g/dL — AB (ref 6.5–8.1)
Total Bilirubin: 1.1 mg/dL (ref 0.3–1.2)

## 2016-02-23 LAB — URINE MICROSCOPIC-ADD ON

## 2016-02-23 LAB — CBC
HCT: 46 % (ref 36.0–46.0)
Hemoglobin: 13.7 g/dL (ref 12.0–15.0)
MCH: 29.1 pg (ref 26.0–34.0)
MCHC: 29.8 g/dL — AB (ref 30.0–36.0)
MCV: 97.7 fL (ref 78.0–100.0)
PLATELETS: 357 10*3/uL (ref 150–400)
RBC: 4.71 MIL/uL (ref 3.87–5.11)
RDW: 14.6 % (ref 11.5–15.5)
WBC: 9.9 10*3/uL (ref 4.0–10.5)

## 2016-02-23 LAB — MAGNESIUM: MAGNESIUM: 1.9 mg/dL (ref 1.7–2.4)

## 2016-02-23 LAB — TROPONIN I
Troponin I: 0.05 ng/mL (ref <0.03)
Troponin I: 0.05 ng/mL (ref <0.03)

## 2016-02-23 LAB — BRAIN NATRIURETIC PEPTIDE: B Natriuretic Peptide: 1063.5 pg/mL — ABNORMAL HIGH (ref 0.0–100.0)

## 2016-02-23 LAB — I-STAT CG4 LACTIC ACID, ED: Lactic Acid, Venous: 1.42 mmol/L (ref 0.5–1.9)

## 2016-02-23 MED ORDER — ONDANSETRON HCL 4 MG/2ML IJ SOLN: 4.0000 mg | Freq: Four times a day (QID) | INTRAMUSCULAR | Status: DC | PRN

## 2016-02-23 MED ORDER — ENOXAPARIN SODIUM 40 MG/0.4ML ~~LOC~~ SOLN: 40.0000 mg | SUBCUTANEOUS | Status: DC

## 2016-02-23 MED ORDER — POTASSIUM CHLORIDE 20 MEQ/15ML (10%) PO SOLN
40.0000 meq | Freq: Once | ORAL | Status: AC
  Administered 2016-02-23: 40 meq via ORAL
  Filled 2016-02-23: qty 30

## 2016-02-23 MED ORDER — SODIUM CHLORIDE 0.9 % IV SOLN
250.0000 mL | INTRAVENOUS | Status: DC | PRN
  Administered 2016-02-27: 250 mL via INTRAVENOUS

## 2016-02-23 MED ORDER — HYDROCODONE-ACETAMINOPHEN 7.5-325 MG PO TABS
1.0000 | ORAL_TABLET | ORAL | Status: DC | PRN
  Administered 2016-02-23 – 2016-02-29 (×24): 1 via ORAL
  Filled 2016-02-23 (×24): qty 1

## 2016-02-23 MED ORDER — ONDANSETRON HCL 4 MG/2ML IJ SOLN
4.0000 mg | Freq: Four times a day (QID) | INTRAMUSCULAR | Status: DC | PRN
  Administered 2016-02-25 – 2016-02-29 (×7): 4 mg via INTRAVENOUS
  Filled 2016-02-23 (×7): qty 2

## 2016-02-23 MED ORDER — BOOST / RESOURCE BREEZE PO LIQD
1.0000 | Freq: Three times a day (TID) | ORAL | Status: DC
  Administered 2016-02-23 – 2016-02-29 (×8): 1 via ORAL

## 2016-02-23 MED ORDER — POTASSIUM CHLORIDE CRYS ER 20 MEQ PO TBCR
40.0000 meq | EXTENDED_RELEASE_TABLET | Freq: Two times a day (BID) | ORAL | Status: AC
  Administered 2016-02-23 – 2016-02-25 (×4): 40 meq via ORAL
  Filled 2016-02-23 (×4): qty 2

## 2016-02-23 MED ORDER — CARVEDILOL 12.5 MG PO TABS
12.5000 mg | ORAL_TABLET | Freq: Two times a day (BID) | ORAL | Status: DC
  Administered 2016-02-24 – 2016-02-29 (×11): 12.5 mg via ORAL
  Filled 2016-02-23 (×11): qty 1

## 2016-02-23 MED ORDER — ACETAMINOPHEN 325 MG PO TABS
650.0000 mg | ORAL_TABLET | ORAL | Status: DC | PRN
Start: 2016-02-23 — End: 2016-02-29
  Administered 2016-02-25: 650 mg via ORAL
  Filled 2016-02-23: qty 2

## 2016-02-23 MED ORDER — NITROGLYCERIN 0.4 MG SL SUBL: 0.4000 mg | SUBLINGUAL_TABLET | SUBLINGUAL | Status: DC | PRN

## 2016-02-23 MED ORDER — SODIUM CHLORIDE 0.9% FLUSH: 3.0000 mL | INTRAVENOUS | Status: DC | PRN

## 2016-02-23 MED ORDER — SODIUM CHLORIDE 0.9% FLUSH
3.0000 mL | Freq: Two times a day (BID) | INTRAVENOUS | Status: DC
  Administered 2016-02-23 – 2016-02-29 (×9): 3 mL via INTRAVENOUS

## 2016-02-23 MED ORDER — FUROSEMIDE 10 MG/ML IJ SOLN
40.0000 mg | Freq: Once | INTRAMUSCULAR | Status: AC
  Administered 2016-02-23: 40 mg via INTRAVENOUS
  Filled 2016-02-23: qty 4

## 2016-02-23 MED ORDER — ENOXAPARIN SODIUM 30 MG/0.3ML ~~LOC~~ SOLN
20.0000 mg | SUBCUTANEOUS | Status: DC
  Administered 2016-02-23 – 2016-02-28 (×6): 20 mg via SUBCUTANEOUS
  Filled 2016-02-23: qty 0.2
  Filled 2016-02-23: qty 0.3
  Filled 2016-02-23 (×4): qty 0.2
  Filled 2016-02-23 (×3): qty 0.3
  Filled 2016-02-23: qty 0.2
  Filled 2016-02-23 (×2): qty 0.3
  Filled 2016-02-23: qty 0.2

## 2016-02-23 MED ORDER — ACETAMINOPHEN 325 MG PO TABS: 650.0000 mg | ORAL_TABLET | ORAL | Status: DC | PRN

## 2016-02-23 MED ORDER — ASPIRIN EC 81 MG PO TBEC
81.0000 mg | DELAYED_RELEASE_TABLET | Freq: Every day | ORAL | Status: DC
  Administered 2016-02-24 – 2016-02-29 (×6): 81 mg via ORAL
  Filled 2016-02-23 (×6): qty 1

## 2016-02-23 MED ORDER — TRAZODONE HCL 150 MG PO TABS
75.0000 mg | ORAL_TABLET | Freq: Every evening | ORAL | Status: DC | PRN
  Administered 2016-02-23 – 2016-02-25 (×3): 75 mg via ORAL
  Filled 2016-02-23 (×3): qty 1

## 2016-02-23 MED ORDER — HYDROCODONE-ACETAMINOPHEN 5-325 MG PO TABS
1.0000 | ORAL_TABLET | Freq: Once | ORAL | Status: AC
  Administered 2016-02-23: 1 via ORAL
  Filled 2016-02-23: qty 1

## 2016-02-23 MED ORDER — FUROSEMIDE 20 MG PO TABS
20.0000 mg | ORAL_TABLET | Freq: Every day | ORAL | Status: DC
  Administered 2016-02-24: 20 mg via ORAL
  Filled 2016-02-23: qty 1

## 2016-02-23 NOTE — ED Notes (Signed)
Pt placed back on cardiac monitor upon return from x-ray

## 2016-02-23 NOTE — ED Notes (Signed)
Pt reports chest pain and SOB for the last several weeks. Pt also c/o mid abdominal pain radiaiting to her back. Pt reports n/v and diarrhea for the last 2 weeks. Denies recent fever. Lungs CTA. VSS. EKG done in triage.

## 2016-02-23 NOTE — ED Notes (Signed)
Pt transporting to xray.  

## 2016-02-23 NOTE — ED Notes (Signed)
Pt's O2 saturation in the mid to upper 80's on room air upon arrival to room; this tech placed pt on 2L O2 via nasal cannula; pt's O2 saturation now in the mid to upper 90's

## 2016-02-23 NOTE — ED Notes (Signed)
Pt transporting to CT  

## 2016-02-23 NOTE — H&P (Signed)
History and Physical    Laurie HivesJoyce Ziehm ZOX:[id number] DOB: [birth year] DOA: 02/23/2016  PCP: Warrick ParisianSTALLINGS,SHEILA, MD  Patient coming from:   Home    Chief Complaint:   fatigue, SOB  HPI: Laurie Sparks is a 75 y.o. female with medical history significant for but not limited to, CAD and CHF. She was hospitalized here in 2013 with cholangitis but apart from that [facility]. Patient doesn't take diuretics at home, she is not on a salt restricted diet. She wears continue oxygen at home for unclear reasons. Over the last few weeks patient has become increasingly short of breath, especially with activity. She has had some associated chest discomfort and also a dry cough. She is finding it more difficult to sleep on the two pillows and raised bed at home. She uses an inhaler (sparingly )at home. She complains of fatigue. Patient lives at home with son who states that patient's appetite has decreased. Son had no knowledge of patient's history of CHF.   ED Course:  Afebrile, normotensive, normal heart rate, sats normal on home O2 POC troponin negative 0.03, BNP 1063 Lactic acid 1.42 White count normal, potassium 2.7 BUN 27 U/a many bacteria, small leukocytes  Review of Systems: As per HPI, otherwise 10 point review of systems negative.    Past Medical History  Diagnosis Date  . Coronary artery disease   . Myocardial infarct (HCC)   . CHF (congestive heart failure) (HCC)   . COPD (chronic obstructive pulmonary disease) (HCC)   . Depression   . Insomnia   . Reflux     Past Surgical History  Procedure Laterality Date  . Cholecystectomy    . Abdominal hysterectomy    . Ankle surgery      Social History   Social History  . Marital Status: Single    Spouse Name: N/A  . Number of Children: N/A  . Years of Education: N/A   Occupational History  . Not on file.   Social History Main Topics  . Smoking status: Never Smoker   . Smokeless tobacco: Never Used  .  Alcohol Use: No  . Drug Use: No  . Sexual Activity: Not on file   Other Topics Concern  . Not on file   Social History Narrative  lives at home with son  Allergies  Allergen Reactions  . Ambien [Zolpidem Tartrate] Other (See Comments)    Hallucinate   . Ativan [Lorazepam] Other (See Comments)    Hallucinations   . Lactose Intolerance (Gi)   . Other     Base metal and something else    Family History  Problem Relation Age of Onset  . Heart failure Mother   . Diabetes Mother      Prior to Admission medications   Medication Sig Start Date End Date Taking? Authorizing Provider  albuterol (PROVENTIL) (5 MG/ML) 0.5% nebulizer solution Take 0.5 mLs (2.5 mg total) by nebulization 3 (three) times daily as needed for wheezing. 04/07/12 04/07/13  Kathlen ModyVijaya Akula, MD  amoxicillin-clavulanate (AUGMENTIN) 875-125 MG per tablet  04/24/12   Historical Provider, MD  aspirin EC 81 MG tablet Take 81 mg by mouth daily.    Historical Provider, MD  carvedilol (COREG) 12.5 MG tablet Take 12.5 mg by mouth 2 (two) times daily with a meal.    Historical Provider, MD  feeding supplement (RESOURCE BREEZE) LIQD Take 1 Container by mouth 3 (three) times daily between meals. 04/07/12   Kathlen ModyVijaya Akula, MD  HYDROcodone-acetaminophen [facility](NORCO)  7.5-325 MG per tablet Take 1 tablet by mouth every 4 (four) hours as needed. One tablet every 4 to 6 hours as needed for pain For pain 02/27/12   Historical Provider, MD  Hydrocortisone (GERHARDT'S BUTT CREAM) CREA Apply 1 application topically daily. 04/07/12   Kathlen Mody, MD  ipratropium (ATROVENT) 0.02 % nebulizer solution Take 2.5 mLs (0.5 mg total) by nebulization 3 (three) times daily as needed for wheezing. 04/07/12 04/07/13  Kathlen Mody, MD  Multiple Vitamin (MULTIVITAMIN WITH MINERALS) TABS Take 1 tablet by mouth daily. 04/07/12   Kathlen Mody, MD  naproxen sodium (ANAPROX) 220 MG tablet Take 220 mg by mouth 2 (two) times daily as needed. For headaches    Historical  Provider, MD  nitroGLYCERIN (NITROSTAT) 0.4 MG SL tablet Place 0.4 mg under the tongue every 5 (five) minutes as needed. For chest pain    Historical Provider, MD    Physical Exam: Filed Vitals:   02/23/16 1430 02/23/16 1515 02/23/16 1536 02/23/16 1617  BP: 158/72 154/93 131/70 165/94  Pulse: 73 71 70 74  Temp:  98.1 F (36.7 C)    TempSrc:  Oral    Resp: 20 22 13 23   Height:      Weight:      SpO2: 94% 93% 95% 94%    Constitutional:  NAD, calm, comfortable Filed Vitals:   02/23/16 1430 02/23/16 1515 02/23/16 1536 02/23/16 1617  BP: 158/72 154/93 131/70 165/94  Pulse: 73 71 70 74  Temp:  98.1 F (36.7 C)    TempSrc:  Oral    Resp: 20 22 13 23   Height:      Weight:      SpO2: 94% 93% 95% 94%   Eyes: PER, lids and conjunctivae normal ENMT: Mucous membranes are moist. Posterior pharynx clear of any exudate or lesions..  Neck: normal, supple, no masses Respiratory: clear to auscultation bilaterally, no wheezing, no crackles. Normal respiratory effort. No accessory muscle use.  Cardiovascular: Regular rate and rhythm, no murmurs / rubs / gallops. No extremity edema. 2+ pedal pulses.   Abdomen: no tenderness, no masses palpated. No hepatomegaly. Bowel sounds positive.  Musculoskeletal: no clubbing / cyanosis. No joint deformity upper and lower extremities. Good ROM, no contractures. Normal muscle tone.  Skin: no rashes, lesions, ulcers.  Neurologic: CN 2-12 grossly intact. Sensation intact, Strength 5/5 in all 4.  Psychiatric: Normal judgment and insight. Alert and oriented x 3. Normal mood.   Labs on Admission: I have personally reviewed following labs and imaging studies   Urine analysis:    Component Value Date/Time   COLORURINE AMBER* 02/23/2016 1313   APPEARANCEUR CLOUDY* 02/23/2016 1313   LABSPEC 1.033* 02/23/2016 1313   PHURINE 6.0 02/23/2016 1313   GLUCOSEU NEGATIVE 02/23/2016 1313   HGBUR NEGATIVE 02/23/2016 1313   BILIRUBINUR MODERATE* 02/23/2016 1313    KETONESUR 15* 02/23/2016 1313   PROTEINUR NEGATIVE 02/23/2016 1313   UROBILINOGEN 1.0 03/25/2012 1527   NITRITE NEGATIVE 02/23/2016 1313   LEUKOCYTESUR SMALL* 02/23/2016 1313    Radiological Exams on Admission: Dg Chest 2 View  02/23/2016  CLINICAL DATA:  Chest pain for several weeks.  Hypertension EXAM: CHEST  2 VIEW COMPARISON:  April 03, 2012 FINDINGS: There is cardiomegaly. There are bilateral pleural effusions with patchy consolidation in both lung bases. There is pulmonary venous hypertension. There is atherosclerotic calcification in the aorta. No adenopathy apparent. No pneumothorax. There is osteoporosis. There are wedge compression fractures in the upper lumbar region and lower thoracic region.  There is increase in kyphosis. IMPRESSION: Evidence of congestive heart failure. Patchy consolidation in the lung bases raises concern for underlying pneumonia, although these changes may be due entirely to alveolar pulmonary edema. Both entities may exist concurrently. There is aortic atherosclerosis. There are multiple compression fractures in the thoracic and lumbar spine with increase kyphosis. Bones are osteoporotic. Electronically Signed   By: Bretta Bang III M.D.   On: 02/23/2016 11:54    EKG: Independently reviewed.   EKG Interpretation  Date/Time:  Friday February 23 2016 10:44:29 EDT Ventricular Rate:  65 PR Interval:  112 QRS Duration: 60 QT Interval:  480 QTC Calculation: 499 R Axis:   57 Text Interpretation:  Normal sinus rhythm Septal infarct , age undetermined Abnormal ECG Confirmed by BEATON  MD, ROBERT ([id number]) on 02/23/2016 2:33:40 PM        Assessment/Plan   Active Problems:   Hypokalemia   Acute decompensated heart failure (HCC)   Dyspnea   Oxygen dependent      Dyspnea, most likely secondary to decompensated heart based on elevated BNP and CXR findings of pulmonary edema vrs bilateral PNA. -Place in OBS - tele -heart failure order set utilize.    -echocardiogram -Hold off on antibiotics for now, suspect this is more volume overload than PNA.  -Lasix naive. Given 40mg  IV in ED. Give another 40mg  IV this evening then start 20mg  daily. Titrate from there based on response and renal function -continue home coreg -intake and output -daily weights  Oxygen dependent, on 2 liters at home. Per son, this is secondary to lung scarring secondary to reflux       Chest discomfort. Heart score 4. Vague description but worse with movement. Not hypoxic. POC trop negative, EKG without acute finding. Musculoskeletal pain vrs GERD. Cardiac etiology seems less likely.  -monitor on tele -chest pain order set utilized.  -trend troponin.   Hypokalemia, K+ 2.7. Etiology ? Maybe nutritional, not on diuretics. po ordered in ED. Burned inside of mouth. Teeth have cough inside of mouth and K+ aggravated those areas. Family wants to try pills instead of the powder mixed in applesauce. If doesn't tolerate pill will need to try IV -Will give  Kdur BID x 4 doses .- -am bmet -check Mg+ level  Complicated surgical history. Son gives remote history of a hernia repair with multiple complications. Describes stomach and organs being in chest (?hiatal hernia repair).  Compression fractures, multiple in thoracic and lumbar spine. Bones osteopenic, spine kyphotic.  - likely just supportive care                                      DVT prophylaxis:   Lovenox  Code Status:   Full code  Family Communication:  Plan of treatment discussed with son in room and he understands and agrees with treatment plans  Disposition Plan: Discharge home in 24-48 hours              Consults called:    none  Admission status:  Observation - Medical bed / telemetry  Admission -  Medical bed  Telemetry    Willette Cluster NP [facility] Pager [phone]  If 7PM-7AM, please contact night-coverage [email or web address] Password [facility]  02/23/2016, 4:18 PM   Patient was seen,  examined,treatment plan was discussed with the Advance Practice Provider.  I have personally reviewed the clinical findings, labs, EKG, imaging studies  and management of this patient in detail. I have also reviewed the orders written for this patient which were under my direction. I agree with the documentation, as recorded by the Advance Practice Provider.       Jonah Blue, MD

## 2016-02-23 NOTE — ED Provider Notes (Signed)
CSN: [id number]     Arrival date & time 02/23/16  1021 History   First MD Initiated Contact with Patient 02/23/16 1126     Chief Complaint  Patient presents with  . Shortness of Breath  . Abdominal Pain     (Consider location/radiation/quality/duration/timing/severity/associated sxs/prior Treatment) HPI Comments: Laurie Sparks is a 75 y.o. female with h/o COPD, CAD, diastolic CHF last echo in 2013 EF 60-65% presents to ED from PCP office with multiple complaints today. Patient complains b/l lower extremity swelling, shortness of breath, right upper quadrant pain, and right-sided chest pain. Symptoms have been present for a couple of months however in the last few weeks progressively worsened. She endorses lower extremity ankle swelling bilaterally. She has associated right-sided chest pain underneath the ribs, worse with inspiration or movement. She also endorses an intermittent nonproductive cough, shortness of breath, and orthopnea. She has a history of COPD and she is on home oxygen at 3 L. O2 sats high 90s on 2 L. She also endorses right upper quadrant abdominal pain. She has associated nausea, decreased appetite, and dysuria.  Denies vomiting, diarrhea, constipation, blood in stool, hematuria. She also endorses night sweats, generalized weakness. Per review of records, in 2013 she was in septic shock secondary to acute cholangitis from retained gallstones in the common bile duct.   Patient is a 75 y.o. female presenting with shortness of breath and abdominal pain. The history is provided by the patient, medical records and a relative.  Shortness of Breath Associated symptoms: abdominal pain, chest pain, diaphoresis, headaches and sore throat   Associated symptoms: no fever, no rash and no vomiting   Abdominal Pain Associated symptoms: chest pain, chills, dysuria, nausea, shortness of breath and sore throat   Associated symptoms: no constipation, no diarrhea, no fever, no hematuria and no  vomiting     Past Medical History  Diagnosis Date  . Coronary artery disease   . Myocardial infarct (HCC)   . CHF (congestive heart failure) (HCC)   . COPD (chronic obstructive pulmonary disease) (HCC)   . Depression   . Insomnia   . Reflux    Past Surgical History  Procedure Laterality Date  . Cholecystectomy    . Abdominal hysterectomy    . Ankle surgery     History reviewed. No pertinent family history. Social History  Substance Use Topics  . Smoking status: Never Smoker   . Smokeless tobacco: Never Used  . Alcohol Use: No   OB History    No data available     Review of Systems  Constitutional: Positive for chills and diaphoresis. Negative for fever.  HENT: Positive for sore throat.   Eyes: Positive for visual disturbance ( "worsening vision," pt wears glasses).  Respiratory: Positive for shortness of breath.   Cardiovascular: Positive for chest pain and leg swelling.  Gastrointestinal: Positive for nausea and abdominal pain. Negative for vomiting, diarrhea, constipation and blood in stool.  Genitourinary: Positive for dysuria. Negative for hematuria.  Musculoskeletal: Positive for back pain.  Skin: Negative for rash.  Neurological: Positive for weakness ( generalized), numbness and headaches. Seizures:  chronic, intermittent.      Allergies  Ambien; Ativan; Lactose intolerance (gi); and Other  Home Medications   Prior to Admission medications   Medication Sig Start Date End Date Taking? Authorizing Provider  albuterol (PROVENTIL) (5 MG/ML) 0.5% nebulizer solution Take 0.5 mLs (2.5 mg total) by nebulization 3 (three) times daily as needed for wheezing. 04/07/12 04/07/13  Kathlen ModyVijaya Akula, MD  amoxicillin-clavulanate (AUGMENTIN) 875-125 MG per tablet  04/24/12   Historical Provider, MD  aspirin EC 81 MG tablet Take 81 mg by mouth daily.    Historical Provider, MD  carvedilol (COREG) 12.5 MG tablet Take 12.5 mg by mouth 2 (two) times daily with a meal.    Historical  Provider, MD  feeding supplement (RESOURCE BREEZE) LIQD Take 1 Container by mouth 3 (three) times daily between meals. 04/07/12   Kathlen Mody, MD  HYDROcodone-acetaminophen (NORCO) 7.5-325 MG per tablet Take 1 tablet by mouth every 4 (four) hours as needed. One tablet every 4 to 6 hours as needed for pain For pain 02/27/12   Historical Provider, MD  Hydrocortisone (GERHARDT'S BUTT CREAM) CREA Apply 1 application topically daily. 04/07/12   Kathlen Mody, MD  ipratropium (ATROVENT) 0.02 % nebulizer solution Take 2.5 mLs (0.5 mg total) by nebulization 3 (three) times daily as needed for wheezing. 04/07/12 04/07/13  Kathlen Mody, MD  Multiple Vitamin (MULTIVITAMIN WITH MINERALS) TABS Take 1 tablet by mouth daily. 04/07/12   Kathlen Mody, MD  naproxen sodium (ANAPROX) 220 MG tablet Take 220 mg by mouth 2 (two) times daily as needed. For headaches    Historical Provider, MD  nitroGLYCERIN (NITROSTAT) 0.4 MG SL tablet Place 0.4 mg under the tongue every 5 (five) minutes as needed. For chest pain    Historical Provider, MD   BP 154/79 mmHg  Pulse 68  Temp(Src) 98.5 F (36.9 C) (Oral)  Resp 18  Ht 5\' 1"  (1.549 m)  Wt 38.556 kg  BMI 16.07 kg/m2  SpO2 98% Physical Exam  Constitutional: She appears cachectic. She has a sickly appearance. No distress.  HENT:  Head: Normocephalic and atraumatic.  Mouth/Throat: Oropharynx is clear and moist. No oropharyngeal exudate.  Eyes: Conjunctivae and EOM are normal. Pupils are equal, round, and reactive to light. Right eye exhibits no discharge. Left eye exhibits no discharge. No scleral icterus.  Neck: Normal range of motion. Neck supple. No rigidity. Normal range of motion present.  Cardiovascular: Normal rate, regular rhythm, normal heart sounds and intact distal pulses.   No murmur heard. Pulmonary/Chest: Effort normal. No stridor. No respiratory distress. She has decreased breath sounds ( bases b/l).  Abdominal: Soft. Bowel sounds are normal. There is no  tenderness. There is no rebound and no guarding.  Musculoskeletal: Normal range of motion.  Able to move all four. 2+ b/l lower extremity swelling in feet and ankles  Lymphadenopathy:    She has no cervical adenopathy.  Neurological: She is alert. Coordination normal.  Mental Status:  Alert, thought content appropriate, able to give a coherent history. Speech fluent without evidence of aphasia. Able to follow 2 step commands without difficulty.  Cranial Nerves:  II:  Peripheral visual fields grossly normal, pupils equal, round, reactive to light III,IV, VI: ptosis not present, extra-ocular motions intact bilaterally  V,VII: smile symmetric, facial light touch sensation equal VIII: hearing grossly normal to voice  X: uvula elevates symmetrically  XI: bilateral shoulder shrug symmetric and strong XII: midline tongue extension without fassiculations Motor:  Normal tone. 5/5 in upper and lower extremities bilaterally including strong and equal grip strength and dorsiflexion/plantar flexion Sensory: light touch normal in all extremities.  Cerebellar: normal finger-to-nose with bilateral upper extremities CV: distal pulses palpable throughout   Skin: Skin is warm and dry. She is not diaphoretic.  Psychiatric: She has a normal mood and affect.    ED Course  Procedures (including critical care time) Labs Review Labs Reviewed  BASIC METABOLIC PANEL - Abnormal; Notable for the following:    Potassium 2.7 (*)    Chloride 94 (*)    CO2 35 (*)    Glucose, Bld 121 (*)    BUN 27 (*)    Calcium 8.8 (*)    All other components within normal limits  CBC - Abnormal; Notable for the following:    MCHC 29.8 (*)    All other components within normal limits  BRAIN NATRIURETIC PEPTIDE - Abnormal; Notable for the following:    B Natriuretic Peptide 1063.5 (*)    All other components within normal limits  HEPATIC FUNCTION PANEL - Abnormal; Notable for the following:    Total Protein 5.8 (*)     Albumin 3.1 (*)    All other components within normal limits  URINALYSIS, ROUTINE W REFLEX MICROSCOPIC (NOT AT [facility]) - Abnormal; Notable for the following:    Color, Urine AMBER (*)    APPearance CLOUDY (*)    Specific Gravity, Urine 1.033 (*)    Bilirubin Urine MODERATE (*)    Ketones, ur 15 (*)    Leukocytes, UA SMALL (*)    All other components within normal limits  URINE MICROSCOPIC-ADD ON - Abnormal; Notable for the following:    Squamous Epithelial / LPF 0-5 (*)    Bacteria, UA MANY (*)    Casts HYALINE CASTS (*)    All other components within normal limits  URINE CULTURE  I-STAT TROPOININ, ED  I-STAT CG4 LACTIC ACID, ED  Rosezena Sensor, ED    Imaging Review Dg Chest 2 View  02/23/2016  CLINICAL DATA:  Chest pain for several weeks.  Hypertension EXAM: CHEST  2 VIEW COMPARISON:  April 03, 2012 FINDINGS: There is cardiomegaly. There are bilateral pleural effusions with patchy consolidation in both lung bases. There is pulmonary venous hypertension. There is atherosclerotic calcification in the aorta. No adenopathy apparent. No pneumothorax. There is osteoporosis. There are wedge compression fractures in the upper lumbar region and lower thoracic region. There is increase in kyphosis. IMPRESSION: Evidence of congestive heart failure. Patchy consolidation in the lung bases raises concern for underlying pneumonia, although these changes may be due entirely to alveolar pulmonary edema. Both entities may exist concurrently. There is aortic atherosclerosis. There are multiple compression fractures in the thoracic and lumbar spine with increase kyphosis. Bones are osteoporotic. Electronically Signed   By: Bretta Bang III M.D.   On: 02/23/2016 11:54   I have personally reviewed and evaluated these images and lab results as part of my medical decision-making.   EKG Interpretation   Date/Time:  Friday February 23 2016 10:44:29 EDT Ventricular Rate:  65 PR Interval:  112 QRS Duration:  60 QT Interval:  480 QTC Calculation: 499 R Axis:   57 Text Interpretation:  Normal sinus rhythm Septal infarct , age  undetermined Abnormal ECG Confirmed by BEATON  MD, ROBERT ([id number]) on  02/23/2016 2:33:40 PM      Filed Vitals:   02/23/16 1406 02/23/16 1430 02/23/16 1515 02/23/16 1536  BP: 170/82 158/72 154/93 131/70  Pulse: 72 73 71 70  Temp:   98.1 F (36.7 C)   TempSrc:   Oral   Resp: Height:      Weight:      SpO2: 94% 94% 93% 95%    MDM   Final diagnoses:  Acute on chronic diastolic heart failure (HCC)  Hypokalemia   Patient is afebrile, cachectic, and appears older than stated age.  She is resting comfortably in bed,O2 sats upper 90s on 2L O2. Vital signs otherwise stable on initial presentation. Physical exam remarkable for decrease breath sounds b/l in lung bases and 2+ b/l lower extremity swelling. Labs remarkable for hypokalemia at 2.7 and elevated BNP at 1063. EKG shows NSR. Troponin negative. CXR consistent with pulmonary edema. Of note, incidental finding of compression fracture in lower thoracic, upper lumbar spine. Appears to be CHF exacerbation. PO potassium given. IV lasix initiated. Consult to hospitalist for admission.   4:05 PM: Spoke with [facility], appreciated their time and input. Will admit pt for further management of CHF exacerbation and hypokalemia.    Lona Kettle, [place] 02/23/16 1607  Nelva Nay, MD 02/23/16 [phone]

## 2016-02-24 ENCOUNTER — Observation Stay (HOSPITAL_BASED_OUTPATIENT_CLINIC_OR_DEPARTMENT_OTHER): Payer: Medicare Other

## 2016-02-24 DIAGNOSIS — R0600 Dyspnea, unspecified: Secondary | ICD-10-CM | POA: Diagnosis not present

## 2016-02-24 DIAGNOSIS — I5031 Acute diastolic (congestive) heart failure: Secondary | ICD-10-CM

## 2016-02-24 DIAGNOSIS — Z9981 Dependence on supplemental oxygen: Secondary | ICD-10-CM

## 2016-02-24 DIAGNOSIS — E876 Hypokalemia: Secondary | ICD-10-CM | POA: Diagnosis not present

## 2016-02-24 DIAGNOSIS — I5033 Acute on chronic diastolic (congestive) heart failure: Secondary | ICD-10-CM | POA: Diagnosis not present

## 2016-02-24 DIAGNOSIS — N390 Urinary tract infection, site not specified: Secondary | ICD-10-CM | POA: Diagnosis not present

## 2016-02-24 LAB — BRAIN NATRIURETIC PEPTIDE: B Natriuretic Peptide: 606.2 pg/mL — ABNORMAL HIGH (ref 0.0–100.0)

## 2016-02-24 LAB — ECHOCARDIOGRAM COMPLETE
HEIGHTINCHES: 61 in
Weight: 1198.4 oz

## 2016-02-24 LAB — BASIC METABOLIC PANEL
ANION GAP: 13 (ref 5–15)
BUN: 16 mg/dL (ref 6–20)
CHLORIDE: 92 mmol/L — AB (ref 101–111)
CO2: 40 mmol/L — AB (ref 22–32)
Calcium: 8.5 mg/dL — ABNORMAL LOW (ref 8.9–10.3)
Creatinine, Ser: 0.61 mg/dL (ref 0.44–1.00)
GFR calc non Af Amer: >60 mL/min (ref >60)
Glucose, Bld: 96 mg/dL (ref 65–99)
POTASSIUM: 3.3 mmol/L — AB (ref 3.5–5.1)
Sodium: 145 mmol/L (ref 135–145)

## 2016-02-24 LAB — TROPONIN I: TROPONIN I: 0.05 ng/mL — AB (ref <0.03)

## 2016-02-24 MED ORDER — SERTRALINE HCL 100 MG PO TABS
100.0000 mg | ORAL_TABLET | Freq: Every day | ORAL | Status: DC
Start: 2016-02-24 — End: 2016-02-29
  Administered 2016-02-24 – 2016-02-29 (×6): 100 mg via ORAL
  Filled 2016-02-24 (×6): qty 1

## 2016-02-24 MED ORDER — POTASSIUM CHLORIDE 20 MEQ PO PACK
40.0000 meq | PACK | Freq: Once | ORAL | Status: AC
  Administered 2016-02-24: 40 meq via ORAL
  Filled 2016-02-24: qty 2

## 2016-02-24 NOTE — Progress Notes (Signed)
Echocardiogram 2D Echocardiogram has been performed.  Dorothey BasemanReel, Shiraz Bastyr M 02/24/2016, 11:53 AM

## 2016-02-24 NOTE — Progress Notes (Signed)
PROGRESS NOTE    Laurie Sparks  ZOX:[id number] DOB: 11/23/1940 DOA: 02/23/2016 PCP: Warrick Parisian, MD  Outpatient Specialists:   Brief Narrative: 75 y.o. female with medical history significant for but not limited to, CAD and CHF. She was hospitalized here in 2013 with cholangitis but apart from that there aren't any medical records in [facility]. Patient doesn't take diuretics at home, she is not on a salt restricted diet. She wears continue oxygen at home for unclear reasons. Over the last few weeks patient has become increasingly short of breath, especially with activity. She has had some associated chest discomfort and also a dry cough. She is finding it more difficult to sleep on the two pillows and raised bed at home. She uses an inhaler (sparingly )at home. She complains of fatigue. Patient lives at home with son who states that patient's appetite has decreased. Son had no knowledge of patient's history of CHF.  Assessment & Plan:   Active Problems:   Hypokalemia   Acute decompensated heart failure (HCC)   Dyspnea   Oxygen dependent  Dyspnea/Diastolic CHF with exacerbation - Continue cautious diuresis. Will change lasix to IV lasix  bid, and monitor renal function, electrolytes and volume status closely. Patient and patient's son updated. Follow ECHO. Intake and output. Daily weights  Oxygen dependent, on 2 liters at home. Per son, this is secondary to lung scarring secondary to reflux    Chest discomfort. Heart score 4. Vague description but worse with movement. Not hypoxic. POC trop negative, EKG without acute finding. Musculoskeletal pain vrs GERD. Cardiac etiology seems less likely.  -monitor on tele -chest pain order set utilized.  -trend troponin.   Hypokalemia - Continue to replete. Monitor closely while on lasix IV. Renal panel in am. Complicated surgical history. Son gives remote history of a hernia repair with multiple complications. Describes stomach and organs  being in chest (?hiatal hernia repair).  Compression fractures, multiple in thoracic and lumbar spine. Bones osteopenic, spine kyphotic.  - likely just supportive care    DVT prophylaxis: Lovenox  Code Status: Full code  Family Communication: Plan of treatment discussed with son in room and he understands and agrees with treatment plans  Disposition Plan: Discharge home in 24-48 hours  Consults called: none  Admission status: Observation - Medical bed / telemetry Admission - Medical bed Telemetry    Subjective: Patient seen alongside son. Nil new complaints. Leg edema is slowly improving.  Objective: Filed Vitals:   02/23/16 1859 02/23/16 2114 02/24/16 0418 02/24/16 0925  BP: 143/69 166/66 139/67   Pulse: 71 84 75 100  Temp: 98.6 F (37 C) 98 F (36.7 C) 98.2 F (36.8 C)   TempSrc: Oral Oral Oral   Resp: Height:      Weight:   33.974 kg (74 lb 14.4 oz)   SpO2: 95% 92% 95%     Intake/Output Summary (Last 24 hours) at 02/24/16 0952 Last data filed at 02/24/16 0420  Gross per 24 hour  Intake      0 ml  Output   2351 ml  Net  -2351 ml   Filed Weights   02/23/16 1041 02/24/16 0418  Weight: 38.556 kg (85 lb) 33.974 kg (74 lb 14.4 oz)    Examination:  General exam: Appears calm and comfortable.   Respiratory system: Clear to auscultation. Respiratory effort normal. Cardiovascular system: S1 & S2 heard. Gastrointestinal system: Abdomen is nondistended, soft and nontender.   Central nervous system: Alert and oriented. Moves  all limbs.  Extremities: Bilateral lower leg edema.   Data Reviewed: I have personally reviewed following labs and imaging studies  CBC:  Recent Labs Lab 02/23/16 1208  WBC 9.9  HGB 13.7  HCT 46.0  MCV 97.7  PLT 357   Basic Metabolic Panel:  Recent Labs Lab 02/23/16 1208 02/23/16 1758 02/24/16 0547  NA 141  --  145  K 2.7*  --  3.3*  CL 94*  --  92*  CO2 35*  --   40*  GLUCOSE 121*  --  96  BUN 27*  --  16  CREATININE 0.72  --  0.61  CALCIUM 8.8*  --  8.5*  MG  --  1.9  --    GFR: Estimated Creatinine Clearance: 32.6 mL/min (by C-G formula based on Cr of 0.61). Liver Function Tests:  Recent Labs Lab 02/23/16 1208  AST 30  ALT 16  ALKPHOS 107  BILITOT 1.1  PROT 5.8*  ALBUMIN 3.1*   No results for input(s): LIPASE, AMYLASE in the last 168 hours. No results for input(s): AMMONIA in the last 168 hours. Coagulation Profile: No results for input(s): INR, PROTIME in the last 168 hours. Cardiac Enzymes:  Recent Labs Lab 02/23/16 1758 02/23/16 2159 02/24/16 0102  TROPONINI 0.05* 0.05* 0.05*   BNP (last 3 results) No results for input(s): PROBNP in the last 8760 hours. HbA1C: No results for input(s): HGBA1C in the last 72 hours. CBG: No results for input(s): GLUCAP in the last 168 hours. Lipid Profile: No results for input(s): CHOL, HDL, LDLCALC, TRIG, CHOLHDL, LDLDIRECT in the last 72 hours. Thyroid Function Tests: No results for input(s): TSH, T4TOTAL, FREET4, T3FREE, THYROIDAB in the last 72 hours. Anemia Panel: No results for input(s): VITAMINB12, FOLATE, FERRITIN, TIBC, IRON, RETICCTPCT in the last 72 hours. Urine analysis:    Component Value Date/Time   COLORURINE AMBER* 02/23/2016 1313   APPEARANCEUR CLOUDY* 02/23/2016 1313   LABSPEC 1.033* 02/23/2016 1313   PHURINE 6.0 02/23/2016 1313   GLUCOSEU NEGATIVE 02/23/2016 1313   HGBUR NEGATIVE 02/23/2016 1313   BILIRUBINUR MODERATE* 02/23/2016 1313   KETONESUR 15* 02/23/2016 1313   PROTEINUR NEGATIVE 02/23/2016 1313   UROBILINOGEN 1.0 03/25/2012 1527   NITRITE NEGATIVE 02/23/2016 1313   LEUKOCYTESUR SMALL* 02/23/2016 1313   Sepsis Labs: @LABRCNTIP (procalcitonin:4,lacticidven:4)  )No results found for this or any previous visit (from the past 240 hour(s)).       Radiology Studies: Dg Chest 2 View  02/23/2016  CLINICAL DATA:  Chest pain for several weeks.   Hypertension EXAM: CHEST  2 VIEW COMPARISON:  April 03, 2012 FINDINGS: There is cardiomegaly. There are bilateral pleural effusions with patchy consolidation in both lung bases. There is pulmonary venous hypertension. There is atherosclerotic calcification in the aorta. No adenopathy apparent. No pneumothorax. There is osteoporosis. There are wedge compression fractures in the upper lumbar region and lower thoracic region. There is increase in kyphosis. IMPRESSION: Evidence of congestive heart failure. Patchy consolidation in the lung bases raises concern for underlying pneumonia, although these changes may be due entirely to alveolar pulmonary edema. Both entities may exist concurrently. There is aortic atherosclerosis. There are multiple compression fractures in the thoracic and lumbar spine with increase kyphosis. Bones are osteoporotic. Electronically Signed   By: Bretta Bang III M.D.   On: 02/23/2016 11:54        Scheduled Meds: . aspirin EC  81 mg Oral Daily  . carvedilol  12.5 mg Oral BID WC  . enoxaparin (LOVENOX)  injection  20 mg Subcutaneous Q24H  . feeding supplement  1 Container Oral TID BM  . furosemide  20 mg Oral Daily  . potassium chloride  40 mEq Oral Once  . potassium chloride  40 mEq Oral BID  . sertraline  100 mg Oral Daily  . sodium chloride flush  3 mL Intravenous Q12H   Continuous Infusions:    Time spent: 30 Minutes    Berton MountSylvester Ally Knodel, MD  [facility] Pager #: [phone] 7PM-7AM contact night coverage as above

## 2016-02-25 ENCOUNTER — Observation Stay (HOSPITAL_COMMUNITY): Payer: Medicare Other

## 2016-02-25 DIAGNOSIS — R0600 Dyspnea, unspecified: Secondary | ICD-10-CM | POA: Diagnosis not present

## 2016-02-25 DIAGNOSIS — N390 Urinary tract infection, site not specified: Secondary | ICD-10-CM | POA: Diagnosis not present

## 2016-02-25 DIAGNOSIS — I5033 Acute on chronic diastolic (congestive) heart failure: Secondary | ICD-10-CM | POA: Diagnosis not present

## 2016-02-25 LAB — RENAL FUNCTION PANEL
Albumin: 2.5 g/dL — ABNORMAL LOW (ref 3.5–5.0)
Anion gap: 7 (ref 5–15)
BUN: 12 mg/dL (ref 6–20)
CO2: 37 mmol/L — ABNORMAL HIGH (ref 22–32)
Calcium: 8.4 mg/dL — ABNORMAL LOW (ref 8.9–10.3)
Chloride: 99 mmol/L — ABNORMAL LOW (ref 101–111)
Creatinine, Ser: 0.42 mg/dL — ABNORMAL LOW (ref 0.44–1.00)
GFR calc Af Amer: >60 mL/min (ref >60)
GFR calc non Af Amer: >60 mL/min (ref >60)
Glucose, Bld: 89 mg/dL (ref 65–99)
Phosphorus: 2.4 mg/dL — ABNORMAL LOW (ref 2.5–4.6)
Potassium: 4.5 mmol/L (ref 3.5–5.1)
Sodium: 143 mmol/L (ref 135–145)

## 2016-02-25 LAB — MAGNESIUM: Magnesium: 1.7 mg/dL (ref 1.7–2.4)

## 2016-02-25 LAB — URINE CULTURE

## 2016-02-25 MED ORDER — DEXTROSE 5 % IV SOLN
1.0000 g | INTRAVENOUS | Status: AC
  Administered 2016-02-25 – 2016-02-29 (×5): 1 g via INTRAVENOUS
  Filled 2016-02-25 (×5): qty 10

## 2016-02-25 MED ORDER — CETYLPYRIDINIUM CHLORIDE 0.05 % MT LIQD
7.0000 mL | Freq: Two times a day (BID) | OROMUCOSAL | Status: DC
  Administered 2016-02-27 – 2016-02-29 (×3): 7 mL via OROMUCOSAL

## 2016-02-25 MED ORDER — ONDANSETRON HCL 4 MG/2ML IJ SOLN
4.0000 mg | Freq: Once | INTRAMUSCULAR | Status: AC
  Administered 2016-02-25: 4 mg via INTRAVENOUS
  Filled 2016-02-25: qty 2

## 2016-02-25 MED ORDER — FUROSEMIDE 10 MG/ML IJ SOLN
20.0000 mg | Freq: Two times a day (BID) | INTRAMUSCULAR | Status: DC
  Administered 2016-02-25 – 2016-02-28 (×7): 20 mg via INTRAVENOUS
  Filled 2016-02-25 (×7): qty 2

## 2016-02-25 NOTE — Progress Notes (Signed)
Called to pt room for complaint of nausea. Pt states she gets nauseous anytime she tries to eat. Pt last given PRN IV Zofran at 1330. Paged Dr. Dartha Lodgegbata. Given one time order for 4mg  IV Zofran.   Leonidas Rombergaitlin S Bumbledare, RN

## 2016-02-25 NOTE — Progress Notes (Addendum)
Pt noted to have red, non raised rash on abdomen. Rash is primarily in skin fold/excess skin. Pt has history of abdominal surgery and per son previous PEG tube placement at that site. Per pt this site is occasionally red at home. Abdomen cleansed and barrier cream applied. Will make oncoming nurse aware. Pt call light within reach, son at bedside.   Leonidas Rombergaitlin S Bumbledare, RN

## 2016-02-25 NOTE — Progress Notes (Signed)
PROGRESS NOTE    Laurie Sparks  ZOX:[id number] DOB: 07-16-1941 DOA: 02/23/2016 PCP: Warrick Parisian, MD  Outpatient Specialists:   Brief Narrative: 75 y.o. female with medical history significant for but not limited to, CAD and CHF. She was hospitalized here in 2013 with cholangitis but apart from that there aren't any medical records in [facility]. Patient doesn't take diuretics at home, she is not on a salt restricted diet. She wears continue oxygen at home for unclear reasons. Over the last few weeks patient has become increasingly short of breath, especially with activity. She has had some associated chest discomfort and also a dry cough. She is finding it more difficult to sleep on the two pillows and raised bed at home. She uses an inhaler (sparingly )at home. She complains of fatigue. Patient lives at home with son who states that patient's appetite has decreased. Son had no knowledge of patient's history of CHF.  Assessment & Plan:   Active Problems:   Hypokalemia   Acute decompensated heart failure (HCC)   Dyspnea   Oxygen dependent  Dyspnea/Diastolic CHF with exacerbation - Continue cautious diuresis. On IV lasix  Bid. Kindly review diuretics in am, and consider changing to oral if deemed appropriate. Monitor renal function, electrolytes and volume status closely. Patient and patient's son updated. Intake and output. Daily weights.  UTI secondary to E coli - Start IV Rocephin.  Oxygen dependent, on 2 liters at home. Per son, this is secondary to lung scarring secondary to reflux    Chest discomfort - Resolved.  Hypokalemia - Resolved. Potassium is 4.5 today.  Complicated surgical history. Son gives remote history of a hernia repair with multiple complications. Describes stomach and organs being in chest (?hiatal hernia repair).  Compression fractures, multiple in thoracic and lumbar spine. Bones osteopenic, spine kyphotic.  - likely just supportive  care    DVT prophylaxis: Lovenox  Code Status: Full code  Family Communication: Plan of treatment discussed with son in room and he understands and agrees with treatment plans  Disposition Plan: Discharge home in 24-48 hours  Consults called: none  Admission status: Observation - Medical bed / telemetry Admission - Medical bed Telemetry    Subjective: Patient seen alongside son. Nil new complaints. Leg edema continues to improve.  Objective: Filed Vitals:   02/24/16 1303 02/24/16 1750 02/24/16 2027 02/25/16 0544  BP: 148/73  151/70 134/64  Pulse: 63 79 71 74  Temp: 99.2 F (37.3 C)  98.4 F (36.9 C) 98.9 F (37.2 C)  TempSrc: Oral  Oral Oral  Resp: Height:      Weight:    34.519 kg (76 lb 1.6 oz)  SpO2: 95%  96% 97%    Intake/Output Summary (Last 24 hours) at 02/25/16 1012 Last data filed at 02/25/16 0934  Gross per 24 hour  Intake    480 ml  Output    452 ml  Net     28 ml   Filed Weights   02/23/16 1041 02/24/16 0418 02/25/16 0544  Weight: 38.556 kg (85 lb) 33.974 kg (74 lb 14.4 oz) 34.519 kg (76 lb 1.6 oz)    Examination:  General exam: Appears calm and comfortable.   Respiratory system: Clear to auscultation. Respiratory effort normal. Cardiovascular system: S1 & S2 heard. Gastrointestinal system: Abdomen is nondistended, soft and nontender.   Central nervous system: Alert and oriented. Moves all limbs.  Extremities: Bilateral lower leg edema.   Data Reviewed: I have personally reviewed following labs  and imaging studies  CBC:  Recent Labs Lab 02/23/16 1208  WBC 9.9  HGB 13.7  HCT 46.0  MCV 97.7  PLT 357   Basic Metabolic Panel:  Recent Labs Lab 02/23/16 1208 02/23/16 1758 02/24/16 0547 02/25/16 0226  NA 141  --  145 143  K 2.7*  --  3.3* 4.5  CL 94*  --  92* 99*  CO2 35*  --  40* 37*  GLUCOSE 121*  --  96 89  BUN 27*  --  16 12  CREATININE 0.72  --  0.61 0.42*    CALCIUM 8.8*  --  8.5* 8.4*  MG  --  1.9  --  1.7  PHOS  --   --   --  2.4*   GFR: Estimated Creatinine Clearance: 33.1 mL/min (by C-G formula based on Cr of 0.42). Liver Function Tests:  Recent Labs Lab 02/23/16 1208 02/25/16 0226  AST 30  --   ALT 16  --   ALKPHOS 107  --   BILITOT 1.1  --   PROT 5.8*  --   ALBUMIN 3.1* 2.5*   No results for input(s): LIPASE, AMYLASE in the last 168 hours. No results for input(s): AMMONIA in the last 168 hours. Coagulation Profile: No results for input(s): INR, PROTIME in the last 168 hours. Cardiac Enzymes:  Recent Labs Lab 02/23/16 1758 02/23/16 2159 02/24/16 0102  TROPONINI 0.05* 0.05* 0.05*   BNP (last 3 results) No results for input(s): PROBNP in the last 8760 hours. HbA1C: No results for input(s): HGBA1C in the last 72 hours. CBG: No results for input(s): GLUCAP in the last 168 hours. Lipid Profile: No results for input(s): CHOL, HDL, LDLCALC, TRIG, CHOLHDL, LDLDIRECT in the last 72 hours. Thyroid Function Tests: No results for input(s): TSH, T4TOTAL, FREET4, T3FREE, THYROIDAB in the last 72 hours. Anemia Panel: No results for input(s): VITAMINB12, FOLATE, FERRITIN, TIBC, IRON, RETICCTPCT in the last 72 hours. Urine analysis:    Component Value Date/Time   COLORURINE AMBER* 02/23/2016 1313   APPEARANCEUR CLOUDY* 02/23/2016 1313   LABSPEC 1.033* 02/23/2016 1313   PHURINE 6.0 02/23/2016 1313   GLUCOSEU NEGATIVE 02/23/2016 1313   HGBUR NEGATIVE 02/23/2016 1313   BILIRUBINUR MODERATE* 02/23/2016 1313   KETONESUR 15* 02/23/2016 1313   PROTEINUR NEGATIVE 02/23/2016 1313   UROBILINOGEN 1.0 03/25/2012 1527   NITRITE NEGATIVE 02/23/2016 1313   LEUKOCYTESUR SMALL* 02/23/2016 1313   Sepsis Labs: @LABRCNTIP (procalcitonin:4,lacticidven:4)  ) Recent Results (from the past 240 hour(s))  Urine culture     Status: Abnormal   Collection Time: 02/23/16  2:30 PM  Result Value Ref Range Status   Specimen Description URINE,  CATHETERIZED  Final   Special Requests NONE  Final   Culture >=100,000 COLONIES/mL ESCHERICHIA COLI (A)  Final   Report Status 02/25/2016 FINAL  Final   Organism ID, Bacteria ESCHERICHIA COLI (A)  Final      Susceptibility   Escherichia coli - MIC*    AMPICILLIN <=2 SENSITIVE Sensitive     CEFAZOLIN <=4 SENSITIVE Sensitive     CEFTRIAXONE <=1 SENSITIVE Sensitive     CIPROFLOXACIN <=0.25 SENSITIVE Sensitive     GENTAMICIN <=1 SENSITIVE Sensitive     IMIPENEM <=0.25 SENSITIVE Sensitive     NITROFURANTOIN <=16 SENSITIVE Sensitive     TRIMETH/SULFA <=20 SENSITIVE Sensitive     AMPICILLIN/SULBACTAM <=2 SENSITIVE Sensitive     PIP/TAZO <=4 SENSITIVE Sensitive     * >=100,000 COLONIES/mL ESCHERICHIA COLI  Radiology Studies: Dg Chest 2 View  02/25/2016  CLINICAL DATA:  Shortness of breath and weakness. EXAM: CHEST  2 VIEW COMPARISON:  Two-view chest x-ray a 02/23/2016. FINDINGS: Large bilateral pleural effusions and associated airspace disease are not significantly changed. The heart is enlarged. Atherosclerotic calcifications are again noted. IMPRESSION: Similar appearance of large bilateral pleural effusions and bibasilar airspace disease. Electronically Signed   By: Marin Roberts M.D.   On: 02/25/2016 09:21   Dg Chest 2 View  02/23/2016  CLINICAL DATA:  Chest pain for several weeks.  Hypertension EXAM: CHEST  2 VIEW COMPARISON:  April 03, 2012 FINDINGS: There is cardiomegaly. There are bilateral pleural effusions with patchy consolidation in both lung bases. There is pulmonary venous hypertension. There is atherosclerotic calcification in the aorta. No adenopathy apparent. No pneumothorax. There is osteoporosis. There are wedge compression fractures in the upper lumbar region and lower thoracic region. There is increase in kyphosis. IMPRESSION: Evidence of congestive heart failure. Patchy consolidation in the lung bases raises concern for underlying pneumonia, although these  changes may be due entirely to alveolar pulmonary edema. Both entities may exist concurrently. There is aortic atherosclerosis. There are multiple compression fractures in the thoracic and lumbar spine with increase kyphosis. Bones are osteoporotic. Electronically Signed   By: Bretta Bang III M.D.   On: 02/23/2016 11:54        Scheduled Meds: . aspirin EC  81 mg Oral Daily  . carvedilol  12.5 mg Oral BID WC  . enoxaparin (LOVENOX) injection  20 mg Subcutaneous Q24H  . feeding supplement  1 Container Oral TID BM  . furosemide  20 mg Intravenous BID  . sertraline  100 mg Oral Daily  . sodium chloride flush  3 mL Intravenous Q12H   Continuous Infusions:    Time spent: 30 Minutes    Berton Mount, MD  [facility] Pager #: [phone] 7PM-7AM contact night coverage as above

## 2016-02-25 NOTE — Evaluation (Signed)
Physical Therapy Evaluation Patient Details Name: Laurie Sparks MRN: [id number] DOB: 1941-02-06 Today's Date: 02/25/2016   History of Present Illness  Patient is a 75 yo female admitted 02/23/16 with SOB, fatigue.  Patient with decompensated HF and hypokalemia.  Patient with recent weight loss.   PMH:CAD, MI, CHF, on home O2, COPD, mult thoracic and lumbar compression fractures, osteopenia    Clinical Impression  Patient presents with problems listed below.  Will benefit from acute PT to maximize functional mobility prior to d/c.  Patient significantly deconditioned, with increased dyspnea with mobility.  Recommend SNF for continued therapy prior to return home.    Follow Up Recommendations SNF;Supervision/Assistance - 24 hour    Equipment Recommendations  Rolling walker with 5" wheels;3in1 (PT)    Recommendations for Other Services       Precautions / Restrictions Precautions Precautions: Fall Precaution Comments: On O2 Restrictions Weight Bearing Restrictions: No      Mobility  Bed Mobility Overal bed mobility: Needs Assistance Bed Mobility: Supine to Sit;Sit to Supine     Supine to sit: Min guard;HOB elevated Sit to supine: Min guard;HOB elevated   General bed mobility comments: Assist for safety.  Patient able to move to EOB and return to supine with min guard assist.  Transfers Overall transfer level: Needs assistance Equipment used: Rolling walker (2 wheeled) Transfers: Sit to/from Stand Sit to Stand: Min assist         General transfer comment: Verbal cues for hand placement.  Assist to power up to standing.  Once upright, patient with increased dyspnea.  Able to stand 2 minutes and returned to supine.  Ambulation/Gait             General Gait Details: Did not attempt today  Stairs            Wheelchair Mobility    Modified Rankin (Stroke Patients Only)       Balance Overall balance assessment: Needs assistance Sitting-balance support:  No upper extremity supported;Feet supported Sitting balance-Leahy Scale: Fair     Standing balance support: Bilateral upper extremity supported Standing balance-Leahy Scale: Poor                               Pertinent Vitals/Pain Pain Assessment: 0-10 Pain Score: 8  Pain Location: Ribs Pain Descriptors / Indicators: Sore Pain Intervention(s): Limited activity within patient's tolerance;Monitored during session;Repositioned    Home Living Family/patient expects to be discharged to:: Private residence Living Arrangements: Children (Son) Available Help at Discharge: Family;Available 24 hours/day Type of Home: House Home Access: Stairs to enter Entrance Stairs-Rails: None Entrance Stairs-Number of Steps: 4 Home Layout: One level (Split level with 1 step between two areas of house.  ) Home Equipment: Cane - single point Additional Comments: Patient able to stay in one area of house - to avoid stair.    Prior Function Level of Independence: Independent with assistive device(s);Needs assistance   Gait / Transfers Assistance Needed: Uses cane for gait.  Son assists outside  ADL's / Homemaking Assistance Needed: Son assists with meal prep and housekeeping.        Hand Dominance        Extremity/Trunk Assessment   Upper Extremity Assessment: Generalized weakness (Decreased shoulder AROM)           Lower Extremity Assessment: Generalized weakness      Cervical / Trunk Assessment: Kyphotic  Communication   Communication: No difficulties  Cognition Arousal/Alertness:  Awake/alert Behavior During Therapy: WFL for tasks assessed/performed;Flat affect Overall Cognitive Status: Within Functional Limits for tasks assessed (Appears to have poor understanding of medical issues.)                      General Comments      Exercises        Assessment/Plan    PT Assessment Patient needs continued PT services  PT Diagnosis Difficulty  walking;Generalized weakness;Acute pain   PT Problem List Decreased strength;Decreased range of motion;Decreased activity tolerance;Decreased balance;Decreased mobility;Decreased knowledge of use of DME;Cardiopulmonary status limiting activity;Pain  PT Treatment Interventions DME instruction;Gait training;Functional mobility training;Therapeutic exercise;Therapeutic activities;Patient/family education   PT Goals (Current goals can be found in the Care Plan section) Acute Rehab PT Goals Patient Stated Goal: None stated PT Goal Formulation: With patient/family Time For Goal Achievement: 03/03/16 Potential to Achieve Goals: Fair    Frequency Min 3X/week   Barriers to discharge        Co-evaluation               End of Session Equipment Utilized During Treatment: Oxygen Activity Tolerance: Patient limited by fatigue;Patient limited by pain (Limited by DOE) Patient left: in bed;with call bell/phone within reach;with family/visitor present Nurse Communication: Mobility status (Son asking if her potassium was for her breathing)    Functional Assessment Tool Used: Clinical judgement Functional Limitation: Mobility: Walking and moving around Mobility: Walking and Moving Around Current Status [phone]): At least 40 percent but less than 60 percent impaired, limited or restricted Mobility: Walking and Moving Around Goal Status [phone]): At least 1 percent but less than 20 percent impaired, limited or restricted    Time: 0981-1914 PT Time Calculation (min) (ACUTE ONLY): 21 min   Charges:   PT Evaluation $PT Eval Moderate Complexity: 1 Procedure     PT G Codes:   PT G-Codes **NOT FOR INPATIENT CLASS** Functional Assessment Tool Used: Clinical judgement Functional Limitation: Mobility: Walking and moving around Mobility: Walking and Moving Around Current Status ([id number]): At least 40 percent but less than 60 percent impaired, limited or restricted Mobility: Walking and Moving Around Goal  Status [phone]): At least 1 percent but less than 20 percent impaired, limited or restricted    Vena Austria 02/25/2016, 6:11 PM Durenda Hurt. Renaldo Fiddler, [facility] Acute Rehab Services Pager [phone]

## 2016-02-26 DIAGNOSIS — Z888 Allergy status to other drugs, medicaments and biological substances status: Secondary | ICD-10-CM | POA: Diagnosis not present

## 2016-02-26 DIAGNOSIS — K219 Gastro-esophageal reflux disease without esophagitis: Secondary | ICD-10-CM | POA: Diagnosis present

## 2016-02-26 DIAGNOSIS — I5033 Acute on chronic diastolic (congestive) heart failure: Secondary | ICD-10-CM | POA: Diagnosis present

## 2016-02-26 DIAGNOSIS — J440 Chronic obstructive pulmonary disease with acute lower respiratory infection: Secondary | ICD-10-CM | POA: Diagnosis present

## 2016-02-26 DIAGNOSIS — N390 Urinary tract infection, site not specified: Secondary | ICD-10-CM | POA: Diagnosis present

## 2016-02-26 DIAGNOSIS — J189 Pneumonia, unspecified organism: Secondary | ICD-10-CM | POA: Diagnosis present

## 2016-02-26 DIAGNOSIS — I252 Old myocardial infarction: Secondary | ICD-10-CM | POA: Diagnosis not present

## 2016-02-26 DIAGNOSIS — M4856XA Collapsed vertebra, not elsewhere classified, lumbar region, initial encounter for fracture: Secondary | ICD-10-CM | POA: Diagnosis present

## 2016-02-26 DIAGNOSIS — R64 Cachexia: Secondary | ICD-10-CM | POA: Diagnosis present

## 2016-02-26 DIAGNOSIS — Z681 Body mass index (BMI) 19 or less, adult: Secondary | ICD-10-CM | POA: Diagnosis not present

## 2016-02-26 DIAGNOSIS — I2510 Atherosclerotic heart disease of native coronary artery without angina pectoris: Secondary | ICD-10-CM | POA: Diagnosis present

## 2016-02-26 DIAGNOSIS — I509 Heart failure, unspecified: Secondary | ICD-10-CM | POA: Diagnosis not present

## 2016-02-26 DIAGNOSIS — Z7982 Long term (current) use of aspirin: Secondary | ICD-10-CM | POA: Diagnosis not present

## 2016-02-26 DIAGNOSIS — Z9981 Dependence on supplemental oxygen: Secondary | ICD-10-CM | POA: Diagnosis not present

## 2016-02-26 DIAGNOSIS — M4854XA Collapsed vertebra, not elsewhere classified, thoracic region, initial encounter for fracture: Secondary | ICD-10-CM | POA: Diagnosis present

## 2016-02-26 DIAGNOSIS — E876 Hypokalemia: Secondary | ICD-10-CM | POA: Diagnosis present

## 2016-02-26 DIAGNOSIS — Z79899 Other long term (current) drug therapy: Secondary | ICD-10-CM | POA: Diagnosis not present

## 2016-02-26 DIAGNOSIS — R0600 Dyspnea, unspecified: Secondary | ICD-10-CM | POA: Diagnosis not present

## 2016-02-26 DIAGNOSIS — E43 Unspecified severe protein-calorie malnutrition: Secondary | ICD-10-CM | POA: Diagnosis present

## 2016-02-26 DIAGNOSIS — M40209 Unspecified kyphosis, site unspecified: Secondary | ICD-10-CM | POA: Diagnosis present

## 2016-02-26 DIAGNOSIS — Z9109 Other allergy status, other than to drugs and biological substances: Secondary | ICD-10-CM | POA: Diagnosis not present

## 2016-02-26 LAB — BASIC METABOLIC PANEL
ANION GAP: 4 — AB (ref 5–15)
BUN: 9 mg/dL (ref 6–20)
CALCIUM: 8.8 mg/dL — AB (ref 8.9–10.3)
CO2: 40 mmol/L — ABNORMAL HIGH (ref 22–32)
CREATININE: 0.47 mg/dL (ref 0.44–1.00)
Chloride: 99 mmol/L — ABNORMAL LOW (ref 101–111)
Glucose, Bld: 98 mg/dL (ref 65–99)
Potassium: 5.1 mmol/L (ref 3.5–5.1)
Sodium: 143 mmol/L (ref 135–145)

## 2016-02-26 LAB — TSH: TSH: 2.137 u[IU]/mL (ref 0.350–4.500)

## 2016-02-26 MED ORDER — PROMETHAZINE HCL 25 MG PO TABS
12.5000 mg | ORAL_TABLET | Freq: Four times a day (QID) | ORAL | Status: DC | PRN
  Administered 2016-02-26 – 2016-02-27 (×2): 12.5 mg via ORAL
  Filled 2016-02-26 (×2): qty 1

## 2016-02-26 MED ORDER — MAGNESIUM SULFATE 2 GM/50ML IV SOLN
2.0000 g | Freq: Once | INTRAVENOUS | Status: AC
  Administered 2016-02-26: 2 g via INTRAVENOUS
  Filled 2016-02-26: qty 50

## 2016-02-26 NOTE — Clinical Social Work Note (Signed)
Clinical Social Work Assessment  Patient Details  Name: Laurie Sparks MRN: [id number] Date of Birth: May 19, 1941  Date of referral:  02/26/16               Reason for consult:  Discharge Planning                Permission sought to share information with:  Family Supports, Chartered certified accountant granted to share information::  Yes, Verbal Permission Granted  Name::     Teacher, adult education::  SNFs  Relationship::  Son  Sport and exercise psychologist Information:     Housing/Transportation Living arrangements for the past 2 months:  [place] of Information:  Adult Children Patient Interpreter Needed:  None Criminal Activity/Legal Involvement Pertinent to Current Situation/Hospitalization:    Significant Relationships:  Adult Children Lives with:  Adult Children Do you feel safe going back to the place where you live?  Yes Need for family participation in patient care:  Yes (Comment)  Care giving concerns:  Patient's son would like to see patient SNF options because he is patient's primary caregiver.    Social Worker assessment / plan: CSW met with patient at beside to complete assessment. Patient wassleeping in bed. Patient's son was also at bedside.CSW explained PT recommendation for SNF placement. CSW explained SNF search and placement process to the patient's son and answered his questions.  CSW will follow up with bed offers.    Employment status:  Retired Nurse, adult PT Recommendations:  [place] / Referral to community resources:  [place]  Patient/Family's Response to care:  The patient's son is happy with the care the patient has received.   Patient/Family's Understanding of and Emotional Response to Diagnosis, Current Treatment, and Prognosis:  The patient's son has a good understanding of why the was admitted. The patient's son understands the care plan and what the patient  will  need post discharge.   Emotional Assessment Appearance:  Appears stated age Attitude/Demeanor/Rapport:  Unable to Assess Affect (typically observed):  Unable to Assess Orientation:   (unable to assess) Alcohol / Substance use:  Not Applicable Psych involvement (Current and /or in the community):  No (Comment)  Discharge Needs  Concerns to be addressed:  Discharge Planning Concerns Readmission within the last 30 days:  No Current discharge risk:  Physical Impairment Barriers to Discharge:  Continued Medical Work up   [employer], LCSW 02/26/2016, 4:08 PM

## 2016-02-26 NOTE — NC FL2 (Signed)
[place] MEDICAID FL2 LEVEL OF CARE SCREENING TOOL     IDENTIFICATION  Patient Name: Laurie Sparks Birthdate: October 28, 1940 Sex: female Admission Date (Current Location): 02/23/2016  [facility] and [place] Number:  Producer, television/film/video and Address:  [facility] [place], [address] [place], [place] [address]      Provider Number: [id number]  Attending Physician Name and Address:  Edsel Petrin, DO  Relative Name and Phone Number:       Current Level of Care: Hospital Recommended Level of Care: Skilled Nursing Facility Prior Approval Number:    Date Approved/Denied:   PASRR Number:    Discharge Plan: SNF    Current Diagnoses: Patient Active Problem List   Diagnosis Date Noted  . Hypokalemia 02/23/2016  . Acute decompensated heart failure (HCC) 02/23/2016  . Dyspnea 02/23/2016  . Oxygen dependent 02/23/2016  . Polymicrobial bacterial infection 04/22/2012  . Bacteremia due to Enterococcus 04/22/2012  . E coli bacteremia 04/22/2012  . Cholangitis 04/22/2012  . Acute cholangitis 04/02/2012    Class: Acute  . Septic shock(785.52) 03/26/2012  . Acute respiratory failure with hypoxia (HCC) 03/26/2012  . Chest pain syndrome 03/25/2012  . CAD (coronary artery disease) 03/25/2012  . COPD (chronic obstructive pulmonary disease) (HCC) 03/25/2012  . Anxiety 03/25/2012  . CHF (congestive heart failure) (HCC) 03/25/2012  . Vitamin D deficiency disease 03/25/2012  . Suicidal ideation 03/25/2012    Class: Acute  . UTI (lower urinary tract infection) 03/25/2012  . Hypotension 03/25/2012  . Fever 03/25/2012    Orientation RESPIRATION BLADDER Height & Weight     Self, Time, Situation, Place  O2 (2 L/min) Continent Weight: 73 lb 14.4 oz (33.521 kg) Height:   (154.9 cm)  BEHAVIORAL SYMPTOMS/MOOD NEUROLOGICAL BOWEL NUTRITION STATUS   (none)  (none) Continent Diet (Heart)  AMBULATORY STATUS COMMUNICATION OF NEEDS Skin   Limited Assist Verbally Normal                       Personal Care Assistance Level of Assistance  Bathing, Feeding, Dressing Bathing Assistance: Limited assistance Feeding assistance: Independent Dressing Assistance: Limited assistance     Functional Limitations Info  Sight, Hearing, Speech Sight Info: Adequate Hearing Info: Impaired Speech Info: Adequate    SPECIAL CARE FACTORS FREQUENCY  PT (By licensed PT), OT (By licensed OT)     PT Frequency: 5/ week OT Frequency: 5/ week            Contractures Contractures Info: Not present    Additional Factors Info  Code Status, Allergies Code Status Info: Full Allergies Info: Ambien, Ativan, Lactose Intolerance (Gi), Other           Current Medications (02/26/2016):  This is the current hospital active medication list Current Facility-Administered Medications  Medication Dose Route Frequency Provider Last Rate Last Dose  . 0.9 %  sodium chloride infusion  250 mL Intravenous PRN Meredith Pel, NP      . acetaminophen (TYLENOL) tablet 650 mg  650 mg Oral Q4H PRN Meredith Pel, NP   650 mg at 02/25/16 0914  . antiseptic oral rinse (CPC / CETYLPYRIDINIUM CHLORIDE 0.05%) solution 7 mL  7 mL Mouth Rinse BID Barnetta Chapel, MD   7 mL at 02/25/16 2200  . aspirin EC tablet 81 mg  81 mg Oral Daily Meredith Pel, NP   81 mg at 02/26/16 0855  . carvedilol (COREG) tablet 12.5 mg  12.5 mg Oral BID  WC Meredith PelPaula M Guenther, NP   12.5 mg at 02/26/16 0854  . cefTRIAXone (ROCEPHIN) 1 g in dextrose 5 % 50 mL IVPB  1 g Intravenous Q24H Barnetta ChapelSylvester I Ogbata, MD   1 g at 02/26/16 1514  . enoxaparin (LOVENOX) injection 20 mg  20 mg Subcutaneous Q24H Meredith PelPaula M Guenther, NP   20 mg at 02/25/16 2244  . feeding supplement (BOOST / RESOURCE BREEZE) liquid 1 Container  1 Container Oral TID BM Meredith PelPaula M Guenther, NP   1 Container at 02/26/16 1143  . furosemide (LASIX) injection 20 mg  20 mg Intravenous BID Barnetta ChapelSylvester I Ogbata, MD   20 mg at 02/26/16 0854  . HYDROcodone-acetaminophen  (NORCO) 7.5-325 MG per tablet 1 tablet  1 tablet Oral Q4H PRN Meredith PelPaula M Guenther, NP   1 tablet at 02/26/16 1309  . nitroGLYCERIN (NITROSTAT) SL tablet 0.4 mg  0.4 mg Sublingual Q5 min PRN Meredith PelPaula M Guenther, NP      . ondansetron [facility](ZOFRAN) injection 4 mg  4 mg Intravenous Q6H PRN Meredith PelPaula M Guenther, NP   4 mg at 02/26/16 0934  . promethazine (PHENERGAN) tablet 12.5 mg  12.5 mg Oral Q6H PRN Maryann Mikhail, DO   12.5 mg at 02/26/16 1309  . sertraline (ZOLOFT) tablet 100 mg  100 mg Oral Daily Barnetta ChapelSylvester I Ogbata, MD   100 mg at 02/26/16 0856  . sodium chloride flush (NS) 0.9 % injection 3 mL  3 mL Intravenous Q12H Meredith PelPaula M Guenther, NP   3 mL at 02/25/16 2244  . sodium chloride flush (NS) 0.9 % injection 3 mL  3 mL Intravenous PRN Meredith PelPaula M Guenther, NP      . traZODone (DESYREL) tablet 75 mg  75 mg Oral QHS PRN Jinger NeighborsMary A Lynch, NP   75 mg at 02/25/16 2244     Discharge Medications: Please see discharge summary for a list of discharge medications.  Relevant Imaging Results:  Relevant Lab Results:   Additional Information [id number]  Reggy EyeLaShonda A Eleanna Theilen, LCSW

## 2016-02-26 NOTE — Progress Notes (Addendum)
PROGRESS NOTE    Laurie Sparks  ZOX:[id number] DOB: 12-16-1940 DOA: 02/23/2016 PCP: Warrick Parisian, MD   Brief Narrative:  HPI on 02/23/2016 by Dr. Jonah Blue Lacresia Sparks is a 75 y.o. female with medical history significant for but not limited to, CAD and CHF. She was hospitalized here in 2013 with cholangitis but apart from that there aren't any medical records in [facility]. Patient doesn't take diuretics at home, she is not on a salt restricted diet. She wears continue oxygen at home for unclear reasons. Over the last few weeks patient has become increasingly short of breath, especially with activity. She has had some associated chest discomfort and also a dry cough. She is finding it more difficult to sleep on the two pillows and raised bed at home. She uses an inhaler (sparingly )at home. She complains of fatigue. Patient lives at home with son who states that patient's appetite has decreased. Son had no knowledge of patient's history of CHF.  Assessment & Plan   Dyspnea secondary to acute diastolic CHF exacerbation -Upon admission, patient did have elevated BNP with chest x-ray findings of pulmonary edema -Echocardiogram showed EF 60-70%, grade 1 diastolic dysfunction -Continue IV Lasix -Monitor intake and output, daily weights -Continue Coreg  COPD/Oxygen dependent -on 2 liters at home. Per son, this is secondary to lung scarring secondary to reflux    Chest discomfort -Appears to have resolved -Troponin cycled and 0.05, remaining flat  Hypokalemia -Resolved  -Upon admission, potassium was 2.7 -Continue to monitor BMP -Magnesium slightly low 1.7, goal of 2. Will give magnesium replacement  Complicated surgical history -Son gives remote history of a hernia repair with multiple complications. Describes stomach and organs being in chest (?hiatal hernia repair).  Compression fractures -multiple in thoracic and lumbar spine. Bones osteopenic, spine kyphotic.  -PT consult and  recommended SNF  DVT Prophylaxis  lovenox  Code Status: Full  Family Communication: Son at bedside  Disposition Plan: Admitted for observation.   Consultants None  Procedures  Echocardiogram  Antibiotics   Anti-infectives    Start     Dose/Rate Route Frequency Ordered Stop   02/25/16 1400  cefTRIAXone (ROCEPHIN) 1 g in dextrose 5 % 50 mL IVPB     1 g 100 mL/hr over 30 Minutes Intravenous Every 24 hours 02/25/16 1200        Subjective:   Laurie Sparks seen and examined today.  Patient states she is feeling mildly better, continues to have shortness of breath (although at time of exam, nasal canula was not on). Denies Chest pain, abdominal pain, dizziness, headache.    Objective:   Filed Vitals:   02/25/16 2008 02/25/16 2253 02/26/16 0422 02/26/16 0854  BP: 99/58  118/61 128/109  Pulse: 83  123 120  Temp: 99.3 F (37.4 C)  99.1 F (37.3 C)   TempSrc: Oral  Oral   Resp: 17  18   Height:      Weight:   33.521 kg (73 lb 14.4 oz)   SpO2: 100% 97% 93%     Intake/Output Summary (Last 24 hours) at 02/26/16 1236 Last data filed at 02/25/16 1953  Gross per 24 hour  Intake    160 ml  Output    250 ml  Net    -90 ml   Filed Weights   02/24/16 0418 02/25/16 0544 02/26/16 0422  Weight: 33.974 kg (74 lb 14.4 oz) 34.519 kg (76 lb 1.6 oz) 33.521 kg (73 lb 14.4 oz)    Exam  General:  Well developed, Thin, cachectic, ill appearing, no apparent distress  HEENT: NCAT, mucous membranes moist.   Cardiovascular: S1 S2 auscultated, tachycardic  Respiratory: Diminished breath sounds, however clear.  Abdomen: Soft, nontender, nondistended, + bowel sounds  Extremities: warm dry without cyanosis clubbing. Lower extremity edema  Neuro: AAOx3, nonfocal  Psych: Normal affect and demeanor   Data Reviewed: I have personally reviewed following labs and imaging studies  CBC:  Recent Labs Lab 02/23/16 1208  WBC 9.9  HGB 13.7  HCT 46.0  MCV 97.7  PLT 357   Basic  Metabolic Panel:  Recent Labs Lab 02/23/16 1208 02/23/16 1758 02/24/16 0547 02/25/16 0226 02/26/16 0236  NA 141  --  145 143 143  K 2.7*  --  3.3* 4.5 5.1  CL 94*  --  92* 99* 99*  CO2 35*  --  40* 37* 40*  GLUCOSE 121*  --  96 89 98  BUN 27*  --  16 12 9   CREATININE 0.72  --  0.61 0.42* 0.47  CALCIUM 8.8*  --  8.5* 8.4* 8.8*  MG  --  1.9  --  1.7  --   PHOS  --   --   --  2.4*  --    GFR: Estimated Creatinine Clearance: 32.1 mL/min (by C-G formula based on Cr of 0.47). Liver Function Tests:  Recent Labs Lab 02/23/16 1208 02/25/16 0226  AST 30  --   ALT 16  --   ALKPHOS 107  --   BILITOT 1.1  --   PROT 5.8*  --   ALBUMIN 3.1* 2.5*   No results for input(s): LIPASE, AMYLASE in the last 168 hours. No results for input(s): AMMONIA in the last 168 hours. Coagulation Profile: No results for input(s): INR, PROTIME in the last 168 hours. Cardiac Enzymes:  Recent Labs Lab 02/23/16 1758 02/23/16 2159 02/24/16 0102  TROPONINI 0.05* 0.05* 0.05*   BNP (last 3 results) No results for input(s): PROBNP in the last 8760 hours. HbA1C: No results for input(s): HGBA1C in the last 72 hours. CBG: No results for input(s): GLUCAP in the last 168 hours. Lipid Profile: No results for input(s): CHOL, HDL, LDLCALC, TRIG, CHOLHDL, LDLDIRECT in the last 72 hours. Thyroid Function Tests: No results for input(s): TSH, T4TOTAL, FREET4, T3FREE, THYROIDAB in the last 72 hours. Anemia Panel: No results for input(s): VITAMINB12, FOLATE, FERRITIN, TIBC, IRON, RETICCTPCT in the last 72 hours. Urine analysis:    Component Value Date/Time   COLORURINE AMBER* 02/23/2016 1313   APPEARANCEUR CLOUDY* 02/23/2016 1313   LABSPEC 1.033* 02/23/2016 1313   PHURINE 6.0 02/23/2016 1313   GLUCOSEU NEGATIVE 02/23/2016 1313   HGBUR NEGATIVE 02/23/2016 1313   BILIRUBINUR MODERATE* 02/23/2016 1313   KETONESUR 15* 02/23/2016 1313   PROTEINUR NEGATIVE 02/23/2016 1313   UROBILINOGEN 1.0 03/25/2012 1527     NITRITE NEGATIVE 02/23/2016 1313   LEUKOCYTESUR SMALL* 02/23/2016 1313   Sepsis Labs: @LABRCNTIP (procalcitonin:4,lacticidven:4)  ) Recent Results (from the past 240 hour(s))  Urine culture     Status: Abnormal   Collection Time: 02/23/16  2:30 PM  Result Value Ref Range Status   Specimen Description URINE, CATHETERIZED  Final   Special Requests NONE  Final   Culture >=100,000 COLONIES/mL ESCHERICHIA COLI (A)  Final   Report Status 02/25/2016 FINAL  Final   Organism ID, Bacteria ESCHERICHIA COLI (A)  Final      Susceptibility   Escherichia coli - MIC*    AMPICILLIN <=2 SENSITIVE Sensitive  CEFAZOLIN <=4 SENSITIVE Sensitive     CEFTRIAXONE <=1 SENSITIVE Sensitive     CIPROFLOXACIN <=0.25 SENSITIVE Sensitive     GENTAMICIN <=1 SENSITIVE Sensitive     IMIPENEM <=0.25 SENSITIVE Sensitive     NITROFURANTOIN <=16 SENSITIVE Sensitive     TRIMETH/SULFA <=20 SENSITIVE Sensitive     AMPICILLIN/SULBACTAM <=2 SENSITIVE Sensitive     PIP/TAZO <=4 SENSITIVE Sensitive     * >=100,000 COLONIES/mL ESCHERICHIA COLI      Radiology Studies: Dg Chest 2 View  02/25/2016  CLINICAL DATA:  Shortness of breath and weakness. EXAM: CHEST  2 VIEW COMPARISON:  Two-view chest x-ray a 02/23/2016. FINDINGS: Large bilateral pleural effusions and associated airspace disease are not significantly changed. The heart is enlarged. Atherosclerotic calcifications are again noted. IMPRESSION: Similar appearance of large bilateral pleural effusions and bibasilar airspace disease. Electronically Signed   By: Marin Robertshristopher  Mattern M.D.   On: 02/25/2016 09:21     Scheduled Meds: . antiseptic oral rinse  7 mL Mouth Rinse BID  . aspirin EC  81 mg Oral Daily  . carvedilol  12.5 mg Oral BID WC  . cefTRIAXone (ROCEPHIN)  IV  1 g Intravenous Q24H  . enoxaparin (LOVENOX) injection  20 mg Subcutaneous Q24H  . feeding supplement  1 Container Oral TID BM  . furosemide  20 mg Intravenous BID  . sertraline  100 mg Oral  Daily  . sodium chloride flush  3 mL Intravenous Q12H   Continuous Infusions:      Time Spent in minutes   30 minutes  Ramsay Bognar D.O. on 02/26/2016 at 12:36 PM  Between 7am to 7pm - Pager - [phone]  After 7pm go to [email or web address] - password [email or web address]  And look for the night coverage person covering for me after hours  [facility] Office  [phone]

## 2016-02-26 NOTE — Care Management Obs Status (Signed)
MEDICARE OBSERVATION STATUS NOTIFICATION   Patient Details  Name: Laurie HivesJoyce Grider MRN: [id number] Date of Birth: Dec 21, 1940   Medicare Observation Status Notification Given:  Yes    Darrold SpanWebster, Trevelle Mcgurn Hall, RN 02/26/2016, 10:43 AM

## 2016-02-26 NOTE — Progress Notes (Signed)
Initial Nutrition Assessment  DOCUMENTATION CODES:   Severe malnutrition in context of chronic illness, Underweight  INTERVENTION:    D/C calorie count   Continue Boost Breeze po TID, each supplement provides 250 kcal and 9 grams of protein  NUTRITION DIAGNOSIS:   Malnutrition related to altered GI function, chronic illness as evidenced by severe depletion of body fat, severe depletion of muscle mass  GOAL:   Patient will meet greater than or equal to 90% of their needs  MONITOR:   PO intake, Supplement acceptance, Labs, Weight trends, I & O's  REASON FOR ASSESSMENT:   Consult Calorie Count  ASSESSMENT:   75 y.o. Female with PMH significant for but not limited to, CAD and CHF. She was hospitalized here in 2013 with cholangitis but apart [facility] Patient doesn't take diuretics at home, she is not on a salt restricted diet. She wears continue oxygen at home for unclear reasons. Over the last few weeks patient has become increasingly short of breath, especially with activity. She has had some associated chest discomfort and also a dry cough. She is finding it more difficult to sleep on the two pillows and raised bed at home. She uses an inhaler (sparingly )at home. She complains of fatigue. Patient lives at home with son who states that patient's appetite has decreased. Son had no knowledge of patient's history of CHF.   RD spoke with patient's sons' at bedside. Patient's sons' report pt takes bites of food and gets nauseous with eating. Patient is drinking some of her Boost Breeze supplement (on tray table). Calorie count initiated 7/16 >> meal tickets reveal pt taking bites. Nutrition-Focused physical exam completed. Findings are severe fat depletion, severe muscle depletion, and no edema.   Diet Order:  Diet Heart Room service appropriate?: Yes; Fluid consistency:: Thin  Skin:  Reviewed, no issues  Last BM:  7/15  Height:    Ht Readings from Last 1 Encounters:  02/23/16 5\' 1"  (1.549 m)    Weight:   Wt Readings from Last 1 Encounters:  02/26/16 73 lb 14.4 oz (33.521 kg)    Ideal Body Weight:  47.7 kg  BMI:  Body mass index is 13.97 kg/(m^2).  Estimated Nutritional Needs:   Kcal:  [phone]  Protein:  40-50 gm  Fluid:  >/= 1.5 L  EDUCATION NEEDS:   No education needs identified at this time  Maureen ChattersKatie Azyiah Bo, RD, LDN Pager #: [phone] After-Hours Pager #: [phone]

## 2016-02-26 NOTE — Clinical Documentation Improvement (Signed)
Internal Medicine  Based on the clinical indicators below please clarify if any of the following conditions is clinically supported.    Respiratory failure (if so please specify the acuity- acute, chronic, acute on chronic, and if it is associated with hypoxia or hypercapnia)   Other  Clinically Undetermined   Supporting Information: ED: "history of COPD and she is on home oxygen at 3 L. O2 sats high 90s on 2 L. "  Past Medical History      CHF, COPD Assessment : Oxygen dependent  Please exercise your independent, professional judgment when responding. A specific answer is not anticipated or expected. Please update your documentation within the medical record to reflect your response to this query. Thank you  Thank Barrie DunkerYou,  Sabiha Sura C Ladarren Steiner Health Information Management [place] [phone]

## 2016-02-27 DIAGNOSIS — E43 Unspecified severe protein-calorie malnutrition: Secondary | ICD-10-CM | POA: Insufficient documentation

## 2016-02-27 DIAGNOSIS — R110 Nausea: Secondary | ICD-10-CM

## 2016-02-27 LAB — CBC
HCT: 38.4 % (ref 36.0–46.0)
Hemoglobin: 10.7 g/dL — ABNORMAL LOW (ref 12.0–15.0)
MCH: 28.5 pg (ref 26.0–34.0)
MCHC: 27.9 g/dL — ABNORMAL LOW (ref 30.0–36.0)
MCV: 102.1 fL — AB (ref 78.0–100.0)
PLATELETS: 285 10*3/uL (ref 150–400)
RBC: 3.76 MIL/uL — AB (ref 3.87–5.11)
RDW: 13.9 % (ref 11.5–15.5)
WBC: 7.1 10*3/uL (ref 4.0–10.5)

## 2016-02-27 LAB — BASIC METABOLIC PANEL
ANION GAP: 5 (ref 5–15)
BUN: 10 mg/dL (ref 6–20)
CHLORIDE: 96 mmol/L — AB (ref 101–111)
CO2: 40 mmol/L — ABNORMAL HIGH (ref 22–32)
Calcium: 8.6 mg/dL — ABNORMAL LOW (ref 8.9–10.3)
Creatinine, Ser: 0.56 mg/dL (ref 0.44–1.00)
GFR calc Af Amer: >60 mL/min (ref >60)
GLUCOSE: 100 mg/dL — AB (ref 65–99)
POTASSIUM: 4 mmol/L (ref 3.5–5.1)
SODIUM: 141 mmol/L (ref 135–145)

## 2016-02-27 MED ORDER — PANTOPRAZOLE SODIUM 20 MG PO TBEC
20.0000 mg | DELAYED_RELEASE_TABLET | Freq: Every day | ORAL | Status: DC
  Administered 2016-02-27 – 2016-02-29 (×3): 20 mg via ORAL
  Filled 2016-02-27 (×3): qty 1

## 2016-02-27 MED ORDER — BISACODYL 10 MG RE SUPP
10.0000 mg | Freq: Once | RECTAL | Status: AC
  Filled 2016-02-27: qty 1

## 2016-02-27 MED ORDER — SUCRALFATE 1 GM/10ML PO SUSP
1.0000 g | Freq: Three times a day (TID) | ORAL | Status: DC
  Administered 2016-02-27 – 2016-02-29 (×7): 1 g via ORAL
  Filled 2016-02-27 (×6): qty 10

## 2016-02-27 MED ORDER — BISACODYL 10 MG RE SUPP
10.0000 mg | Freq: Once | RECTAL | Status: AC
  Administered 2016-02-28: 10 mg via RECTAL
  Filled 2016-02-27: qty 1

## 2016-02-27 NOTE — Consult Note (Signed)
Referring Provider:  Dr. Nunzio Cory Primary Care Physician:  Warrick Parisian, MD Primary Gastroenterologist:  None (unassigned)  Reason for Consultation:  Nausea, food intolerance  HPI: Laurie Sparks is a 75 y.o. female admitted several days ago w/ progressive dyspnea, attributed to COPD and CHF w/ periph edema and large pleural effusions, but w/ nl EF on Echo, elevated BNP of 1063, now 606.  Per ref MD, pt was observed when she was disconnected from suppl O2 and looked like she was about to die.    Pt has h/o of attempted hiatal hernia repair in [place], [place] about 7 yrs ago (per family), apparently complicated (?post-op perf or dehiscence) and ever since then has had chronic nausea, made worse by eating, and leading to vomiting if she eats more than a very tiny amount.  Acidic foods make things worse, but no real difference between solids and liquids. This has been associated w/ severe wt loss--the pt weighed 102 in 2013, now weighs 72 lbs.  Pt was at this hosp in 2013 w/ cholangitis (s/p remote cholecystectomy),  at which time CT showed marked anatomic derangement w/ intrathoracic stomach precluding ERCP so pt was successfully treated by IR for intraductal stone.  Is on chronic hydrocodone for diffuse arthritis and kyphosis, has tendency for constipation w/ periodic alternating diarrhea.  Is on ASA as outpt but also is on omeprazole 40 mg daily.  Current CXR shows bilat large pleural effusions.  Past Medical History  Diagnosis Date  . Coronary artery disease   . Myocardial infarct (HCC)   . CHF (congestive heart failure) (HCC)   . COPD (chronic obstructive pulmonary disease) (HCC)   . Depression   . Insomnia   . Reflux     Past Surgical History  Procedure Laterality Date  . Cholecystectomy    . Abdominal hysterectomy    . Ankle surgery      Prior to Admission medications   Medication Sig Start Date End Date Taking? Authorizing Provider  aspirin EC 81 MG tablet Take 81 mg by  mouth daily.   Yes Historical Provider, MD  carvedilol (COREG) 12.5 MG tablet Take 12.5 mg by mouth 2 (two) times daily with a meal.   Yes Historical Provider, MD  ergocalciferol (VITAMIN D2) 50000 units capsule Take 50,000 Units by mouth once a week. Take on Tuesdays   Yes Historical Provider, MD  HYDROcodone-acetaminophen (NORCO) 7.5-325 MG per tablet Take 1 tablet by mouth See admin instructions. Take 5 tablet by mouth daily 02/27/12  Yes Historical Provider, MD  Multiple Vitamin (MULTIVITAMIN WITH MINERALS) TABS Take 1 tablet by mouth daily. 04/07/12  Yes Kathlen Mody, MD  nitroGLYCERIN (NITROSTAT) 0.4 MG SL tablet Place 0.4 mg under the tongue every 5 (five) minutes as needed. For chest pain   Yes Historical Provider, MD  omeprazole (PRILOSEC) 40 MG capsule Take 40 mg by mouth daily.   Yes Historical Provider, MD  sertraline (ZOLOFT) 100 MG tablet Take 100 mg by mouth daily.   Yes Historical Provider, MD  traZODone (DESYREL) 50 MG tablet Take 75 mg by mouth at bedtime.   Yes Historical Provider, MD  albuterol (PROVENTIL) (5 MG/ML) 0.5% nebulizer solution Take 0.5 mLs (2.5 mg total) by nebulization 3 (three) times daily as needed for wheezing. 04/07/12 04/07/13  Kathlen Mody, MD  amoxicillin-clavulanate (AUGMENTIN) 875-125 MG per tablet Reported on 02/23/2016 04/24/12   Historical Provider, MD  ipratropium (ATROVENT) 0.02 % nebulizer solution Take 2.5 mLs (0.5 mg total) by nebulization 3 (three) times daily  as needed for wheezing. 04/07/12 04/07/13  Kathlen Mody, MD  naproxen sodium (ANAPROX) 220 MG tablet Take 220 mg by mouth 2 (two) times daily as needed. Reported on 02/23/2016    Historical Provider, MD    Current Facility-Administered Medications  Medication Dose Route Frequency Provider Last Rate Last Dose  . 0.9 %  sodium chloride infusion  250 mL Intravenous PRN Meredith Pel, NP 10 mL/hr at 02/27/16 0811 250 mL at 02/27/16 0811  . acetaminophen (TYLENOL) tablet 650 mg  650 mg Oral Q4H PRN  Meredith Pel, NP   650 mg at 02/25/16 0914  . antiseptic oral rinse (CPC / CETYLPYRIDINIUM CHLORIDE 0.05%) solution 7 mL  7 mL Mouth Rinse BID Barnetta Chapel, MD   7 mL at 02/25/16 2200  . aspirin EC tablet 81 mg  81 mg Oral Daily Meredith Pel, NP   81 mg at 02/27/16 1134  . carvedilol (COREG) tablet 12.5 mg  12.5 mg Oral BID WC Meredith Pel, NP   12.5 mg at 02/27/16 0810  . cefTRIAXone (ROCEPHIN) 1 g in dextrose 5 % 50 mL IVPB  1 g Intravenous Q24H Barnetta Chapel, MD   1 g at 02/27/16 1327  . enoxaparin (LOVENOX) injection 20 mg  20 mg Subcutaneous Q24H Meredith Pel, NP   20 mg at 02/26/16 2227  . feeding supplement (BOOST / RESOURCE BREEZE) liquid 1 Container  1 Container Oral TID BM Meredith Pel, NP   1 Container at 02/26/16 1143  . furosemide (LASIX) injection 20 mg  20 mg Intravenous BID Barnetta Chapel, MD   20 mg at 02/27/16 0811  . HYDROcodone-acetaminophen (NORCO) 7.5-325 MG per tablet 1 tablet  1 tablet Oral Q4H PRN Meredith Pel, NP   1 tablet at 02/27/16 1328  . nitroGLYCERIN (NITROSTAT) SL tablet 0.4 mg  0.4 mg Sublingual Q5 min PRN Meredith Pel, NP      . ondansetron [facility]) injection 4 mg  4 mg Intravenous Q6H PRN Meredith Pel, NP   4 mg at 02/27/16 0859  . pantoprazole (PROTONIX) EC tablet 20 mg  20 mg Oral Daily Maryann Mikhail, DO   20 mg at 02/27/16 1315  . promethazine (PHENERGAN) tablet 12.5 mg  12.5 mg Oral Q6H PRN Maryann Mikhail, DO   12.5 mg at 02/26/16 1309  . sertraline (ZOLOFT) tablet 100 mg  100 mg Oral Daily Barnetta Chapel, MD   100 mg at 02/27/16 1134  . sodium chloride flush (NS) 0.9 % injection 3 mL  3 mL Intravenous Q12H Meredith Pel, NP   3 mL at 02/25/16 2244  . sodium chloride flush (NS) 0.9 % injection 3 mL  3 mL Intravenous PRN Meredith Pel, NP      . traZODone (DESYREL) tablet 75 mg  75 mg Oral QHS PRN Jinger Neighbors, NP   75 mg at 02/25/16 2244    Allergies as of 02/23/2016 - Review Complete 02/23/2016   Allergen Reaction Noted  . Ambien [zolpidem tartrate] Other (See Comments) 03/24/2012  . Ativan [lorazepam] Other (See Comments) 04/24/2012  . Lactose intolerance (gi)  05/20/2012  . Other  03/24/2012    Family History  Problem Relation Age of Onset  . Heart failure Mother   . Diabetes Mother     Social History   Social History  . Marital Status: Single    Spouse Name: N/A  . Number of Children: N/A  . Years of  Education: N/A   Occupational History  . Not on file.   Social History Main Topics  . Smoking status: Never Smoker   . Smokeless tobacco: Never Used  . Alcohol Use: No  . Drug Use: No  . Sexual Activity: Not on file   Other Topics Concern  . Not on file   Social History Narrative    Review of Systems: orthopnea, dyspnea, food aversion, nausea, vomiting, constipation alternating with diarrhea  Physical Exam: Vital signs in last 24 hours: Temp:  [98.6 F (37 C)-99 F (37.2 C)] 99 F (37.2 C) (07/18 1325) Pulse Rate:  [79-101] 88 (07/18 1325) Resp:  [16-20] 16 (07/18 1325) BP: (98-124)/(47-75) 98/51 mmHg (07/18 1325) SpO2:  [94 %-98 %] 94 % (07/18 1325) Weight:  [32.795 kg (72 lb 4.8 oz)] 32.795 kg (72 lb 4.8 oz) (07/18 0440) Last BM Date: 02/24/16 General:  Frail, wasted, extremely thin, NAD sitting up in bedside chair. Head:  Normocephalic and atraumatic. Eyes:  Sclera clear, no icterus.   Mouth:   No ulcerations or lesions.  Oropharynx pink & moist. Neck:   No masses or thyromegaly. Lungs:  On [place] O2, kyphosis, ABSENT VESICULAR BREATH SOUNDS.  No evident respiratory distress. Heart:   Regular rate and rhythm; no murmurs, clicks, rubs,  or gallops. Abdomen: soft in epig area; rib cage reaches almost to pelvic brim.  No overt tenderness. Msk:  kyphosis Extremities:  No cyanosis or edema. Neurologic:  Alert and coherent;  grossly normal neurologically. Skin:  Intact without significant lesions or rashes. Cervical Nodes:  No significant cervical  adenopathy. Psych:   Alert and cooperative. Rather quiet affect.  Intake/Output from previous day:   Intake/Output this shift: Total I/O In: 220 [P.O.:220] Out: 700 [Urine:700]  Lab Results:  Recent Labs  02/27/16 0520  WBC 7.1  HGB 10.7*  HCT 38.4  PLT 285   BMET  Recent Labs  02/25/16 0226 02/26/16 0236 02/27/16 0520  NA 143 143 141  K 4.5 5.1 4.0  CL 99* 99* 96*  CO2 37* 40* 40*  GLUCOSE 89 98 100*  BUN 12 9 10   CREATININE 0.42* 0.47 0.56  CALCIUM 8.4* 8.8* 8.6*   LFT  Recent Labs  02/25/16 0226  ALBUMIN 2.5*   PT/INR No results for input(s): LABPROT, INR in the last 72 hours.  Studies/Results: No results found.  Impression: 1. Chronic nausea 2. Anatomic derangement of upper GI tract w/ intrathoracic stomach on CT 4 yrs ago 3. Cachexia, food intolerance, weight loss 4. COPD, on home O2 5. CHF-like presentation w/ elev BNP but nl EF on recent echo  Discussion:  ddx of nausea includes medication (hydrocodone), bile reflux, gastric dysmotility related to constipation, or impaired emptying related to abnl anatomy  Plan: 1. Trial of sucralfate suspension for possible bile reflux 2. Empiric dulcolax suppos for ?obstipation (no BM for a couple of days) 3. When more stable from pulm perspective, and is thus able to lie flat in CT scanner, would favor updated CT of abd/pelvis to (1) assess current upper GI anatomy;  (2) check for malignancy (nausea and wt loss); and (3) assess stool burden 4. Given pt's underlying pulm condition and anatomic deformities, would NOT attempt egd on this patient 5. If pt improves sufficiently, could consider possible UGI series as a functional assessment of gastric emptying, to see how her anatomy is affecting her function 6. I explained to pt and son that there are things going on her that may acct for pt's  sx (eg, anatomy) that we can't fix (not a surg candidate) 7.  We will follow at a distance with you (every several days);  please call us if earlier input is desired 8. All of the above d/w pt, sons at bedside, and Dr. Catha GosselinMikhail   LOS: 1 day   Adelia Baptista V  02/27/2016, 4:04 PM   Pager [phone] If no answer or after 5 PM call [phone]

## 2016-02-27 NOTE — Progress Notes (Signed)
Physical Therapy Treatment Patient Details Name: Laurie Sparks MRN: [id number] DOB: Aug 23, 1940 Today's Date: 02/27/2016    History of Present Illness Patient is a 75 yo female admitted 02/23/16 with SOB, fatigue.  Patient with decompensated HF and hypokalemia.  Patient with recent weight loss.   PMH:CAD, MI, CHF, on home O2, COPD, mult thoracic and lumbar compression fractures, osteopenia    PT Comments    Pt progressing with mobility but remains weak with little functional activity tolerance.  Follow Up Recommendations  SNF;Supervision/Assistance - 24 hour     Equipment Recommendations  Rolling walker with 5" wheels;3in1 (PT)    Recommendations for Other Services       Precautions / Restrictions Precautions Precautions: Fall Precaution Comments: On O2 Restrictions Weight Bearing Restrictions: No    Mobility  Bed Mobility Overal bed mobility: Modified Independent Bed Mobility: Supine to Sit     Supine to sit: Modified independent (Device/Increase time);HOB elevated     General bed mobility comments: Use of rail and incr time  Transfers Overall transfer level: Needs assistance Equipment used: Rolling walker (2 wheeled) Transfers: Sit to/from Stand Sit to Stand: Min guard         General transfer comment: Assist for safety  Ambulation/Gait Ambulation/Gait assistance: Min guard Ambulation Distance (Feet): 25 Feet Assistive device: Rolling walker (2 wheeled) Gait Pattern/deviations: Step-through pattern;Decreased step length - right;Decreased step length - left;Trunk flexed;Narrow base of support Gait velocity: decr Gait velocity interpretation: Below normal speed for age/gender General Gait Details: Amb on 2L of O2 with dyspnea 2/4.  Walker providing stability and pt without any loss of balance.   Stairs            Wheelchair Mobility    Modified Rankin (Stroke Patients Only)       Balance Overall balance assessment: Needs  assistance Sitting-balance support: No upper extremity supported;Feet supported Sitting balance-Leahy Scale: Fair     Standing balance support: Bilateral upper extremity supported Standing balance-Leahy Scale: Poor Standing balance comment: walker and supervision for static standing                    Cognition Arousal/Alertness: Awake/alert Behavior During Therapy: WFL for tasks assessed/performed;Flat affect Overall Cognitive Status: Within Functional Limits for tasks assessed                      Exercises      General Comments        Pertinent Vitals/Pain Pain Assessment: Faces Faces Pain Scale: Hurts little more Pain Location: ribs Pain Descriptors / Indicators: Sore Pain Intervention(s): Limited activity within patient's tolerance;Monitored during session;Repositioned    Home Living                      Prior Function            PT Goals (current goals can now be found in the care plan section) Progress towards PT goals: Progressing toward goals    Frequency  Min 3X/week    PT Plan Current plan remains appropriate    Co-evaluation             End of Session Equipment Utilized During Treatment: Oxygen;Gait belt Activity Tolerance: Patient limited by fatigue Patient left: with call bell/phone within reach;with family/visitor present;in chair     Time: [id number] PT Time Calculation (min) (ACUTE ONLY): 18 min  Charges:  $Gait Training: 8-22 mins  G Codes:      Karam Dunson 02/27/2016, 2:07 PM [facility] PT [phone]

## 2016-02-27 NOTE — Progress Notes (Addendum)
PROGRESS NOTE    Laurie Sparks  UJW:[id number] DOB: [birth year] DOA: 02/23/2016 PCP: Warrick Parisian, MD   Brief Narrative:  HPI on 02/23/2016 by Dr. Jonah Blue Laurie Sparks is a 75 y.o. female with medical history significant for but not limited to, CAD and CHF. She was hospitalized here in 2013 with cholangitis but apart from that there aren't any medical records in [facility]. Patient doesn't take diuretics at home, she is not on a salt restricted diet. She wears continue oxygen at home for unclear reasons. Over the last few weeks patient has become increasingly short of breath, especially with activity. She has had some associated chest discomfort and also a dry cough. She is finding it more difficult to sleep on the two pillows and raised bed at home. She uses an inhaler (sparingly )at home. She complains of fatigue. Patient lives at home with son who states that patient's appetite has decreased. Son had no knowledge of patient's history of CHF.  Assessment & Plan   Dyspnea secondary to acute diastolic CHF exacerbation/pleural effusions -Upon admission, patient did have elevated BNP with chest x-ray findings of pulmonary edema -Echocardiogram showed EF 60-70%, grade 1 diastolic dysfunction -Continue IV Lasix -Monitor intake and output, daily weights (not sure they are being recorded) -Continue Coreg -7/16 CXR large B/L pleural effusions -Spoke with patient and son regarding thoracentesis, would like to think about it.  In the mean time, will obtain repeat CXR for the morning.   COPD/Oxygen dependent -on 2 liters at home. Per son, this is secondary to lung scarring secondary to reflux    Chest discomfort -Appears to have resolved -Troponin cycled and 0.05, remaining flat  Hypokalemia -Resolved  -Upon admission, potassium was 2.7 -Continue to monitor BMP -Magnesium slightly low 1.7, goal of 2. Will give magnesium replacement  Complicated surgical history -Son gives remote history  of a hernia repair with multiple complications. Describes stomach and organs being in chest (?hiatal hernia repair).  Compression fractures -multiple in thoracic and lumbar spine. Bones osteopenic, spine kyphotic.  -PT consult and recommended SNF  Nausea/Poor appetite/Severe malnutrition -Per son, has been ongoing for years and PPI have been switched but no known cause -Patient was supposed to follow up with GI for EGD and colonoscopy -Continue antiemetics PRN -Nutrition consulted - continue feeding supplements and calorie counting -Gastroenterology consulted and appreciated -Spoke with Dr. Matthias Hughs, GI on 7/18: Feels patient's nausea may be due to more anatomy vs bile reflux vs constipation. Will try sucralafate.  If and when patient is able to lie flat, CT abd/pelvis with oral contrast may help define more of her anatomy as well as possible constipation. This could be followed by upper GI series for functional component. GI will continue to follow at a distance.   DVT Prophylaxis  lovenox  Code Status: Full  Family Communication: Son at bedside  Disposition Plan: Admitted. Spoke with patient regarding SNF, still deliberating.   Consultants [facility]  Procedures  Echocardiogram  Antibiotics   Anti-infectives    Start     Dose/Rate Route Frequency Ordered Stop   02/25/16 1400  cefTRIAXone (ROCEPHIN) 1 g in dextrose 5 % 50 mL IVPB     1 g 100 mL/hr over 30 Minutes Intravenous Every 24 hours 02/25/16 1200        Subjective:   Laurie Sparks seen and examined today.  Patient complains of poor appetite and nausea, which has been an ongoing issue per the so.  Continues to have shortness of breath.  Denies Chest pain, abdominal pain, dizziness, headache.    Objective:   Filed Vitals:   02/26/16 1724 02/26/16 1949 02/27/16 0440 02/27/16 0810  BP: 103/47 106/66 124/52 124/75  Pulse: 101 79 85 79  Temp:  98.6 F (37 C) 98.8 F (37.1 C)   TempSrc:  Oral Oral   Resp:  20 20     Height:      Weight:   32.795 kg (72 lb 4.8 oz)   SpO2:  98% 94%    No intake or output data in the 24 hours ending 02/27/16 1031 Filed Weights   02/25/16 0544 02/26/16 0422 02/27/16 0440  Weight: 34.519 kg (76 lb 1.6 oz) 33.521 kg (73 lb 14.4 oz) 32.795 kg (72 lb 4.8 oz)    Exam  General: Well developed, Thin, cachectic, ill appearing, no distress  HEENT: NCAT, mucous membranes moist.   Cardiovascular: S1 S2 auscultated, tachycardic  Respiratory: Diminished breath sounds, however clear.  Abdomen: Soft, nontender, nondistended, + bowel sounds  Extremities: warm dry without cyanosis clubbing. Lower extremity edema, improving  Neuro: AAOx3, nonfocal  Psych: Normal affect and demeanor   Data Reviewed: I have personally reviewed following labs and imaging studies  CBC:  Recent Labs Lab 02/23/16 1208 02/27/16 0520  WBC 9.9 7.1  HGB 13.7 10.7*  HCT 46.0 38.4  MCV 97.7 102.1*  PLT 357 285   Basic Metabolic Panel:  Recent Labs Lab 02/23/16 1208 02/23/16 1758 02/24/16 0547 02/25/16 0226 02/26/16 0236 02/27/16 0520  NA 141  --  145 143 143 141  K 2.7*  --  3.3* 4.5 5.1 4.0  CL 94*  --  92* 99* 99* 96*  CO2 35*  --  40* 37* 40* 40*  GLUCOSE 121*  --  96 89 98 100*  BUN 27*  --  16 12 9 10   CREATININE 0.72  --  0.61 0.42* 0.47 0.56  CALCIUM 8.8*  --  8.5* 8.4* 8.8* 8.6*  MG  --  1.9  --  1.7  --   --   PHOS  --   --   --  2.4*  --   --    GFR: Estimated Creatinine Clearance: 31.5 mL/min (by C-G formula based on Cr of 0.56). Liver Function Tests:  Recent Labs Lab 02/23/16 1208 02/25/16 0226  AST 30  --   ALT 16  --   ALKPHOS 107  --   BILITOT 1.1  --   PROT 5.8*  --   ALBUMIN 3.1* 2.5*   No results for input(s): LIPASE, AMYLASE in the last 168 hours. No results for input(s): AMMONIA in the last 168 hours. Coagulation Profile: No results for input(s): INR, PROTIME in the last 168 hours. Cardiac Enzymes:  Recent Labs Lab 02/23/16 1758  02/23/16 2159 02/24/16 0102  TROPONINI 0.05* 0.05* 0.05*   BNP (last 3 results) No results for input(s): PROBNP in the last 8760 hours. HbA1C: No results for input(s): HGBA1C in the last 72 hours. CBG: No results for input(s): GLUCAP in the last 168 hours. Lipid Profile: No results for input(s): CHOL, HDL, LDLCALC, TRIG, CHOLHDL, LDLDIRECT in the last 72 hours. Thyroid Function Tests:  Recent Labs  02/26/16 1104  TSH 2.137   Anemia Panel: No results for input(s): VITAMINB12, FOLATE, FERRITIN, TIBC, IRON, RETICCTPCT in the last 72 hours. Urine analysis:    Component Value Date/Time   COLORURINE AMBER* 02/23/2016 1313   APPEARANCEUR CLOUDY* 02/23/2016 1313   LABSPEC 1.033* 02/23/2016 1313  PHURINE 6.0 02/23/2016 1313   GLUCOSEU NEGATIVE 02/23/2016 1313   HGBUR NEGATIVE 02/23/2016 1313   BILIRUBINUR MODERATE* 02/23/2016 1313   KETONESUR 15* 02/23/2016 1313   PROTEINUR NEGATIVE 02/23/2016 1313   UROBILINOGEN 1.0 03/25/2012 1527   NITRITE NEGATIVE 02/23/2016 1313   LEUKOCYTESUR SMALL* 02/23/2016 1313   Sepsis Labs: (procalcitonin:4,lacticidven:4)  ) Recent Results (from the past 240 hour(s))  Urine culture     Status: Abnormal   Collection Time: 02/23/16  2:30 PM  Result Value Ref Range Status   Specimen Description URINE, CATHETERIZED  Final   Special Requests NONE  Final   Culture >=100,000 COLONIES/mL ESCHERICHIA COLI (A)  Final   Report Status 02/25/2016 FINAL  Final   Organism ID, Bacteria ESCHERICHIA COLI (A)  Final      Susceptibility   Escherichia coli - MIC*    AMPICILLIN <=2 SENSITIVE Sensitive     CEFAZOLIN <=4 SENSITIVE Sensitive     CEFTRIAXONE <=1 SENSITIVE Sensitive     CIPROFLOXACIN <=0.25 SENSITIVE Sensitive     GENTAMICIN <=1 SENSITIVE Sensitive     IMIPENEM <=0.25 SENSITIVE Sensitive     NITROFURANTOIN <=16 SENSITIVE Sensitive     TRIMETH/SULFA <=20 SENSITIVE Sensitive     AMPICILLIN/SULBACTAM <=2 SENSITIVE Sensitive     PIP/TAZO  <=4 SENSITIVE Sensitive     * >=100,000 COLONIES/mL ESCHERICHIA COLI      Radiology Studies: No results found.   Scheduled Meds: . antiseptic oral rinse  7 mL Mouth Rinse BID  . aspirin EC  81 mg Oral Daily  . carvedilol  12.5 mg Oral BID WC  . cefTRIAXone (ROCEPHIN)  IV  1 g Intravenous Q24H  . enoxaparin (LOVENOX) injection  20 mg Subcutaneous Q24H  . feeding supplement  1 Container Oral TID BM  . furosemide  20 mg Intravenous BID  . sertraline  100 mg Oral Daily  . sodium chloride flush  3 mL Intravenous Q12H   Continuous Infusions:    LOS: 1 day   Time Spent in minutes   30 minutes  Adrien Dietzman D.O. on 02/27/2016 at 10:31 AM  Between 7am to 7pm - Pager - [phone]  After 7pm go to [email or web address] - password [email or web address]  And look for the night coverage person covering for me after hours  [facility] Office  [phone]

## 2016-02-28 ENCOUNTER — Inpatient Hospital Stay (HOSPITAL_COMMUNITY): Payer: Medicare Other

## 2016-02-28 DIAGNOSIS — I509 Heart failure, unspecified: Secondary | ICD-10-CM

## 2016-02-28 DIAGNOSIS — E43 Unspecified severe protein-calorie malnutrition: Secondary | ICD-10-CM

## 2016-02-28 LAB — CBC
HEMATOCRIT: 39.4 % (ref 36.0–46.0)
HEMOGLOBIN: 11.4 g/dL — AB (ref 12.0–15.0)
MCH: 28.6 pg (ref 26.0–34.0)
MCHC: 28.9 g/dL — ABNORMAL LOW (ref 30.0–36.0)
MCV: 99 fL (ref 78.0–100.0)
Platelets: 274 10*3/uL (ref 150–400)
RBC: 3.98 MIL/uL (ref 3.87–5.11)
RDW: 13.7 % (ref 11.5–15.5)
WBC: 6.7 10*3/uL (ref 4.0–10.5)

## 2016-02-28 LAB — BASIC METABOLIC PANEL
Anion gap: 8 (ref 5–15)
BUN: 12 mg/dL (ref 6–20)
CHLORIDE: 95 mmol/L — AB (ref 101–111)
CO2: 37 mmol/L — ABNORMAL HIGH (ref 22–32)
Calcium: 8.7 mg/dL — ABNORMAL LOW (ref 8.9–10.3)
Creatinine, Ser: 0.5 mg/dL (ref 0.44–1.00)
GFR calc Af Amer: >60 mL/min (ref >60)
GFR calc non Af Amer: >60 mL/min (ref >60)
Glucose, Bld: 102 mg/dL — ABNORMAL HIGH (ref 65–99)
POTASSIUM: 4.6 mmol/L (ref 3.5–5.1)
SODIUM: 140 mmol/L (ref 135–145)

## 2016-02-28 MED ORDER — FUROSEMIDE 40 MG PO TABS
40.0000 mg | ORAL_TABLET | Freq: Every day | ORAL | Status: DC
  Administered 2016-02-29: 40 mg via ORAL
  Filled 2016-02-28: qty 1

## 2016-02-28 NOTE — Clinical Social Work Note (Addendum)
CSW provided patient and patient's sons with bed offers. Patient and Patient's sons are refusing SNF placement. CSW signing off. Consult if any other social work needs arise.  Valero EnergyShonny Jelissa Espiritu, LCSW [phone]

## 2016-02-28 NOTE — Care Management Important Message (Signed)
Important Message  Patient Details  Name: Audelia HivesJoyce Robins MRN: [id number] Date of Birth: [birth year]   Medicare Important Message Given:  Yes    Kyla BalzarineShealy, Cheresa Siers Abena 02/28/2016, 10:25 AM

## 2016-02-28 NOTE — Progress Notes (Signed)
PROGRESS NOTE    Laurie Sparks  ZOX:[id number] DOB: 20-Jul-1941 DOA: 02/23/2016 PCP: Warrick Parisian, MD   Brief Narrative:  HPI on 02/23/2016 by Dr. Jonah Blue Laurie Sparks is a 75 y.o. female with medical history significant for but not limited to, CAD and CHF. She was hospitalized here in 2013 with cholangitis but apart from that there aren't any medical records in [facility]. Patient doesn't take diuretics at home, she is not on a salt restricted diet. She wears continue oxygen at home for unclear reasons. Over the last few weeks patient has become increasingly short of breath, especially with activity. She has had some associated chest discomfort and also a dry cough. She is finding it more difficult to sleep on the two pillows and raised bed at home. She uses an inhaler (sparingly )at home. She complains of fatigue. Patient lives at home with son who states that patient's appetite has decreased. Son had no knowledge of patient's history of CHF.  Assessment & Plan   Dyspnea secondary to acute diastolic CHF exacerbation/pleural effusions -Upon admission, patient did have elevated BNP with chest x-ray findings of pulmonary edema -Echocardiogram showed EF 60-70%, grade 1 diastolic dysfunction -Continue IV Lasix -Monitor intake and output, daily weights (not sure they are being recorded) -Continue Coreg -7/16 CXR large B/L pleural effusions -Repeat chest x-ray on 02/28/2016 showing stable bibasilar atelectasis with moderate-sized pleural effusions -On 02/28/2016 she has a negative fluid balance of 2.4 L, clinically appears euvolemic, will stop IV Lasix, transition to oral Lasix at 40 mg daily  COPD/Oxygen dependent -on 2 liters at home. Per son, this is secondary to lung scarring secondary to reflux    Chest discomfort -Appears to have resolved -Troponin cycled and 0.05, remaining flat  Hypokalemia -Resolved  -Upon admission, potassium was 2.7 -Continue to monitor BMP -Magnesium  slightly low 1.7, goal of 2. Will give magnesium replacement  Complicated surgical history -Son gives remote history of a hernia repair with multiple complications. Describes stomach and organs being in chest (?hiatal hernia repair).  Compression fractures -multiple in thoracic and lumbar spine. Bones osteopenic, spine kyphotic.  -PT consult and recommended SNF  Nausea/Poor appetite/Severe malnutrition -Per son, has been ongoing for years and PPI have been switched but no known cause -Patient was supposed to follow up with GI for EGD and colonoscopy -Continue antiemetics PRN -Nutrition consulted - continue feeding supplements and calorie counting -Gastroenterology consulted and appreciated -Dr Catha Gosselin spoke with Dr. Matthias Hughs, GI on 7/18: Feels patient's nausea may be due to more anatomy vs bile reflux vs constipation. Will try sucralafate.  If and when patient is able to lie flat, CT abd/pelvis with oral contrast may help define more of her anatomy as well as possible constipation. This could be followed by upper GI series for functional component. GI will continue to follow at a distance.  -On 02/28/2016 she reports ongoing nausea however was able to tolerate her breakfast  DVT Prophylaxis  lovenox  Code Status: Full  Family Communication: Son at bedside  Disposition Plan: Anticipate discharge to skilled nursing facility  Consultants [facility]  Procedures  Echocardiogram  Antibiotics   Anti-infectives    Start     Dose/Rate Route Frequency Ordered Stop   02/25/16 1400  cefTRIAXone (ROCEPHIN) 1 g in dextrose 5 % 50 mL IVPB     1 g 100 mL/hr over 30 Minutes Intravenous Every 24 hours 02/25/16 1200        Subjective:   Laurie Sparks is awake and alert,  reports ongoing nausea, denies emesis, chest pain, shortness of breath  Objective:   Filed Vitals:   02/27/16 2041 02/28/16 0507 02/28/16 0820 02/28/16 1332  BP: 108/61 148/71 126/73 127/68  Pulse: 71  79 80  Temp: 98.8 F  (37.1 C) 98.2 F (36.8 C)  97.8 F (36.6 C)  TempSrc: Oral Oral  Oral  Resp: 18 20    Height:      Weight:  33.203 kg (73 lb 3.2 oz)    SpO2: 100% 100%  100%    Intake/Output Summary (Last 24 hours) at 02/28/16 1357 Last data filed at 02/28/16 1300  Gross per 24 hour  Intake    603 ml  Output    350 ml  Net    253 ml   Filed Weights   02/26/16 0422 02/27/16 0440 02/28/16 0507  Weight: 33.521 kg (73 lb 14.4 oz) 32.795 kg (72 lb 4.8 oz) 33.203 kg (73 lb 3.2 oz)    Exam  General: Thin, cachectic, Chronically ill appearing, no distress  HEENT: NCAT, mucous membranes moist.   Cardiovascular: S1 S2 auscultated, tachycardic  Respiratory: Diminished breath sounds, however clear.  Abdomen: Soft, nontender, nondistended, + bowel sounds  Extremities: warm dry without cyanosis clubbing. Lower extremity edema, improving  Neuro: AAOx3, nonfocal  Psych: Normal affect and demeanor   Data Reviewed: I have personally reviewed following labs and imaging studies  CBC:  Recent Labs Lab 02/23/16 1208 02/27/16 0520 02/28/16 0415  WBC 9.9 7.1 6.7  HGB 13.7 10.7* 11.4*  HCT 46.0 38.4 39.4  MCV 97.7 102.1* 99.0  PLT 357 285 274   Basic Metabolic Panel:  Recent Labs Lab 02/23/16 1758 02/24/16 0547 02/25/16 0226 02/26/16 0236 02/27/16 0520 02/28/16 0415  NA  --  145 143 143 141 140  K  --  3.3* 4.5 5.1 4.0 4.6  CL  --  92* 99* 99* 96* 95*  CO2  --  40* 37* 40* 40* 37*  GLUCOSE  --  96 89 98 100* 102*  BUN  --  16 12 9 10 12   CREATININE  --  0.61 0.42* 0.47 0.56 0.50  CALCIUM  --  8.5* 8.4* 8.8* 8.6* 8.7*  MG 1.9  --  1.7  --   --   --   PHOS  --   --  2.4*  --   --   --    GFR: Estimated Creatinine Clearance: 31.8 mL/min (by C-G formula based on Cr of 0.5). Liver Function Tests:  Recent Labs Lab 02/23/16 1208 02/25/16 0226  AST 30  --   ALT 16  --   ALKPHOS 107  --   BILITOT 1.1  --   PROT 5.8*  --   ALBUMIN 3.1* 2.5*   No results for input(s): LIPASE,  AMYLASE in the last 168 hours. No results for input(s): AMMONIA in the last 168 hours. Coagulation Profile: No results for input(s): INR, PROTIME in the last 168 hours. Cardiac Enzymes:  Recent Labs Lab 02/23/16 1758 02/23/16 2159 02/24/16 0102  TROPONINI 0.05* 0.05* 0.05*   BNP (last 3 results) No results for input(s): PROBNP in the last 8760 hours. HbA1C: No results for input(s): HGBA1C in the last 72 hours. CBG: No results for input(s): GLUCAP in the last 168 hours. Lipid Profile: No results for input(s): CHOL, HDL, LDLCALC, TRIG, CHOLHDL, LDLDIRECT in the last 72 hours. Thyroid Function Tests:  Recent Labs  02/26/16 1104  TSH 2.137   Anemia Panel: No results for  input(s): VITAMINB12, FOLATE, FERRITIN, TIBC, IRON, RETICCTPCT in the last 72 hours. Urine analysis:    Component Value Date/Time   COLORURINE AMBER* 02/23/2016 1313   APPEARANCEUR CLOUDY* 02/23/2016 1313   LABSPEC 1.033* 02/23/2016 1313   PHURINE 6.0 02/23/2016 1313   GLUCOSEU NEGATIVE 02/23/2016 1313   HGBUR NEGATIVE 02/23/2016 1313   BILIRUBINUR MODERATE* 02/23/2016 1313   KETONESUR 15* 02/23/2016 1313   PROTEINUR NEGATIVE 02/23/2016 1313   UROBILINOGEN 1.0 03/25/2012 1527   NITRITE NEGATIVE 02/23/2016 1313   LEUKOCYTESUR SMALL* 02/23/2016 1313   Sepsis Labs: (procalcitonin:4,lacticidven:4)  ) Recent Results (from the past 240 hour(s))  Urine culture     Status: Abnormal   Collection Time: 02/23/16  2:30 PM  Result Value Ref Range Status   Specimen Description URINE, CATHETERIZED  Final   Special Requests NONE  Final   Culture >=100,000 COLONIES/mL ESCHERICHIA COLI (A)  Final   Report Status 02/25/2016 FINAL  Final   Organism ID, Bacteria ESCHERICHIA COLI (A)  Final      Susceptibility   Escherichia coli - MIC*    AMPICILLIN <=2 SENSITIVE Sensitive     CEFAZOLIN <=4 SENSITIVE Sensitive     CEFTRIAXONE <=1 SENSITIVE Sensitive     CIPROFLOXACIN <=0.25 SENSITIVE Sensitive      GENTAMICIN <=1 SENSITIVE Sensitive     IMIPENEM <=0.25 SENSITIVE Sensitive     NITROFURANTOIN <=16 SENSITIVE Sensitive     TRIMETH/SULFA <=20 SENSITIVE Sensitive     AMPICILLIN/SULBACTAM <=2 SENSITIVE Sensitive     PIP/TAZO <=4 SENSITIVE Sensitive     * >=100,000 COLONIES/mL ESCHERICHIA COLI      Radiology Studies: Dg Chest 2 View  02/28/2016  CLINICAL DATA:  Shortness of breath, pleural effusion, CHF, Celsius OPD EXAM: CHEST  2 VIEW COMPARISON:  Chest x-ray of February 25, 2016 FINDINGS: The lungs are well-expanded. There is bibasilar atelectasis or pneumonia with bilateral pleural effusions. The heart is top-normal in size. The pulmonary vascularity is mildly prominent. There is calcification in the wall of the aortic arch. IMPRESSION: COPD. Stable bibasilar atelectasis or pneumonia with moderate-sized pleural effusions. Aortic atherosclerosis. Electronically Signed   By: David  [place] M.D.   On: 02/28/2016 07:14     Scheduled Meds: . antiseptic oral rinse  7 mL Mouth Rinse BID  . aspirin EC  81 mg Oral Daily  . carvedilol  12.5 mg Oral BID WC  . cefTRIAXone (ROCEPHIN)  IV  1 g Intravenous Q24H  . enoxaparin (LOVENOX) injection  20 mg Subcutaneous Q24H  . feeding supplement  1 Container Oral TID BM  . furosemide  20 mg Intravenous BID  . pantoprazole  20 mg Oral Daily  . sertraline  100 mg Oral Daily  . sodium chloride flush  3 mL Intravenous Q12H  . sucralfate  1 g Oral TID WC & HS   Continuous Infusions:    LOS: 2 days   Time Spent in minutes   30 minutes  Phiona Ramnauth D.O. on 02/28/2016 at 1:57 PM  Between 7am to 7pm - Pager - [phone]  After 7pm go to [email or web address] - password [email or web address]  And look for the night coverage person covering for me after hours  [facility] Office  [phone]

## 2016-02-29 DIAGNOSIS — E876 Hypokalemia: Secondary | ICD-10-CM

## 2016-02-29 DIAGNOSIS — I5031 Acute diastolic (congestive) heart failure: Secondary | ICD-10-CM

## 2016-02-29 LAB — BASIC METABOLIC PANEL WITH GFR
Anion gap: 4 — ABNORMAL LOW (ref 5–15)
BUN: 11 mg/dL (ref 6–20)
CO2: 39 mmol/L — ABNORMAL HIGH (ref 22–32)
Calcium: 8.7 mg/dL — ABNORMAL LOW (ref 8.9–10.3)
Chloride: 95 mmol/L — ABNORMAL LOW (ref 101–111)
Creatinine, Ser: 0.46 mg/dL (ref 0.44–1.00)
GFR calc Af Amer: >60 mL/min
GFR calc non Af Amer: >60 mL/min
Glucose, Bld: 113 mg/dL — ABNORMAL HIGH (ref 65–99)
Potassium: 4.5 mmol/L (ref 3.5–5.1)
Sodium: 138 mmol/L (ref 135–145)

## 2016-02-29 LAB — CBC
HEMATOCRIT: 38.6 % (ref 36.0–46.0)
HEMOGLOBIN: 11.3 g/dL — AB (ref 12.0–15.0)
MCH: 28.8 pg (ref 26.0–34.0)
MCHC: 29.3 g/dL — ABNORMAL LOW (ref 30.0–36.0)
MCV: 98.2 fL (ref 78.0–100.0)
Platelets: 263 10*3/uL (ref 150–400)
RBC: 3.93 MIL/uL (ref 3.87–5.11)
RDW: 13.5 % (ref 11.5–15.5)
WBC: 5.8 10*3/uL (ref 4.0–10.5)

## 2016-02-29 MED ORDER — FUROSEMIDE 20 MG PO TABS: 20.0000 mg | ORAL_TABLET | Freq: Every day | ORAL | Status: DC

## 2016-02-29 MED ORDER — BOOST / RESOURCE BREEZE PO LIQD: 1.0000 | Freq: Three times a day (TID) | ORAL | Status: AC

## 2016-02-29 NOTE — Discharge Summary (Addendum)
Physician Discharge Summary  Laurie Sparks RUE:[id number] DOB: 24-Dec-1940 DOA: 02/23/2016  PCP: Warrick Parisian, MD  Admit date: 02/23/2016 Discharge date: 02/29/2016  Time spent: 35 minutes  Recommendations for Outpatient Follow-up:  1. During this hospitalization she was evaluated by physical therapy who recommended skilled nursing facility placement. After discussions with her family she elected to go home with home health services rather. Prior to discharge to set up with home health as well as therapy, occupational therapy, nursing, home aide 2. Please follow up appointments status, she was discharged on Lasix 20 mg by mouth daily 3. Recommend BMP on hospital follow-up   Discharge Diagnoses:  Principal Problem:   Acute diastolic CHF (congestive heart failure) (HCC) Active Problems:   Hypokalemia   Acute decompensated heart failure (HCC)   Dyspnea   Oxygen dependent   Protein-calorie malnutrition, severe   Discharge Condition: Stable  Diet recommendation: Regular diet  Filed Weights   02/27/16 0440 02/28/16 0507 02/29/16 0347  Weight: 32.795 kg (72 lb 4.8 oz) 33.203 kg (73 lb 3.2 oz) 30.119 kg (66 lb 6.4 oz)    History of present illness:  Laurie Sparks is a 75 y.o. female with medical history significant for but not limited to, CAD and CHF. She was hospitalized here in 2013 with cholangitis but apart from that there aren't any medical records in [facility]. Patient doesn't take diuretics at home, she is not on a salt restricted diet. She wears continue oxygen at home for unclear reasons. Over the last few weeks patient has become increasingly short of breath, especially with activity. She has had some associated chest discomfort and also a dry cough. She is finding it more difficult to sleep on the two pillows and raised bed at home. She uses an inhaler (sparingly )at home. She complains of fatigue. Patient lives at home with son who states that patient's appetite has decreased. Son had  no knowledge of patient's history of CHF.   Hospital Course:   Dyspnea secondary to acute diastolic CHF exacerbation/pleural effusions -Upon admission, patient did have elevated BNP with chest x-ray findings of pulmonary edema -Echocardiogram showed EF 60-70%, grade 1 diastolic dysfunction -7/16 CXR large B/L pleural effusions -Repeat chest x-ray on 02/28/2016 showing stable bibasilar atelectasis with moderate-sized pleural effusions -On 02/28/2016 she has a negative fluid balance of 2.4 L, clinically appears euvolemic, will stop IV Lasix, transition to oral Lasix at 40 mg daily -On 02/29/2016 patient had remained negative fluid balance of 2.1 L.  -On 02/29/2016: Plan to discharge her on Lasix 20 mg by mouth daily, please follow up on volume status and BMP on hospital follow up. Lasix may require titration.  -With regard to pleural effusions we discussed with Mrs Donati and her son the option of thoracentesis which they declined. Hopefully diuretic therapy may keep fluid down.    COPD/Oxygen dependent -She is on home oxygen.  -Respiratory status improving during this hospitalization   Chest discomfort -Appears to have resolved -Troponin cycled and 0.05, remaining flat  Hypokalemia -Resolved  -Upon admission, potassium was 2.7 -On day of discharge she had a potassium of 4.5  Complicated surgical history -Son gives remote history of a hernia repair with multiple complications. Describes stomach and organs being in chest (?hiatal hernia repair).  Compression fractures -multiple in thoracic and lumbar spine. Bones osteopenic, spine kyphotic.  -PT consult and recommended SNF  Nausea/Poor appetite/Severe malnutrition -Per son, has been ongoing for years and PPI have been switched but no known cause -Patient was supposed to  follow up with GI for EGD and colonoscopy -Nutrition consulted - continue feeding supplements and calorie counting -Gastroenterology consulted and  appreciated -Dr Catha GosselinMikhail spoke with Dr. Matthias HughsBuccini, GI on 7/18: Feels patient's nausea may be due to more anatomy vs bile reflux vs constipation. Will try sucralafate. If and when patient is able to lie flat, CT abd/pelvis with oral contrast may help define more of her anatomy as well as possible constipation. This could be followed by upper GI series for functional component. GI will continue to follow at a distance.  -On 02/28/2016 she reports ongoing nausea however was able to tolerate her breakfast -On 02/29/2016 she reported improvement to her nausea, tolerating by mouth, she felt well enough to go home.   Discharge Exam: Filed Vitals:   02/29/16 0347 02/29/16 0833  BP: 148/61 134/65  Pulse: 74 90  Temp: 98.5 F (36.9 C)   Resp: 16     General: Thin, chronically ill-appearing, cachectic, no acute distress Cardiovascular: Regular rate and rhythm normal S1-S2 Respiratory: Normal respiratory effort Abdomen: Soft nontender nondistended Extremities: Significant sarcopenia  Discharge Instructions   Discharge Instructions    Call MD for:  difficulty breathing, headache or visual disturbances    Complete by:  As directed      Call MD for:  extreme fatigue    Complete by:  As directed      Call MD for:  hives    Complete by:  As directed      Call MD for:  persistant dizziness or light-headedness    Complete by:  As directed      Call MD for:  persistant nausea and vomiting    Complete by:  As directed      Call MD for:  redness, tenderness, or signs of infection (pain, swelling, redness, odor or green/yellow discharge around incision site)    Complete by:  As directed      Call MD for:  severe uncontrolled pain    Complete by:  As directed      Call MD for:  temperature >100.4    Complete by:  As directed      Call MD for:    Complete by:  As directed      Diet - low sodium heart healthy    Complete by:  As directed      Increase activity slowly    Complete by:  As directed            Current Discharge Medication List    START taking these medications   Details  furosemide (LASIX) 20 MG tablet Take 1 tablet (20 mg total) by mouth daily. Qty: 30 tablet, Refills: 0      CONTINUE these medications which have CHANGED   Details  feeding supplement (BOOST / RESOURCE BREEZE) LIQD Take 1 Container by mouth 3 (three) times daily between meals.      CONTINUE these medications which have NOT CHANGED   Details  aspirin EC 81 MG tablet Take 81 mg by mouth daily.    carvedilol (COREG) 12.5 MG tablet Take 12.5 mg by mouth 2 (two) times daily with a meal.    ergocalciferol (VITAMIN D2) 50000 units capsule Take 50,000 Units by mouth once a week. Take on Tuesdays    HYDROcodone-acetaminophen (NORCO) 7.5-325 MG per tablet Take 1 tablet by mouth See admin instructions. Take 5 tablet by mouth daily    Multiple Vitamin (MULTIVITAMIN WITH MINERALS) TABS Take 1 tablet by mouth daily.  nitroGLYCERIN (NITROSTAT) 0.4 MG SL tablet Place 0.4 mg under the tongue every 5 (five) minutes as needed. For chest pain    omeprazole (PRILOSEC) 40 MG capsule Take 40 mg by mouth daily.    sertraline (ZOLOFT) 100 MG tablet Take 100 mg by mouth daily.    traZODone (DESYREL) 50 MG tablet Take 75 mg by mouth at bedtime.    albuterol (PROVENTIL) (5 MG/ML) 0.5% nebulizer solution Take 0.5 mLs (2.5 mg total) by nebulization 3 (three) times daily as needed for wheezing. Qty: 20 mL, Refills: 1    ipratropium (ATROVENT) 0.02 % nebulizer solution Take 2.5 mLs (0.5 mg total) by nebulization 3 (three) times daily as needed for wheezing. Qty: 75 mL, Refills: 0      STOP taking these medications     amoxicillin-clavulanate (AUGMENTIN) 875-125 MG per tablet      naproxen sodium (ANAPROX) 220 MG tablet        Allergies  Allergen Reactions  . Ambien [Zolpidem Tartrate] Other (See Comments)    Hallucinate   . Ativan [Lorazepam] Other (See Comments)    Hallucinations   . Lactose  Intolerance (Gi)   . Other     Base metal and something else   Follow-up Information    Follow up with Warrick Parisian, MD In 1 week.   Specialty:  Family Medicine       The results of significant diagnostics from this hospitalization (including imaging, microbiology, ancillary and laboratory) are listed below for reference.    Significant Diagnostic Studies: Dg Chest 2 View  02/28/2016  CLINICAL DATA:  Shortness of breath, pleural effusion, CHF, Celsius OPD EXAM: CHEST  2 VIEW COMPARISON:  Chest x-ray of February 25, 2016 FINDINGS: The lungs are well-expanded. There is bibasilar atelectasis or pneumonia with bilateral pleural effusions. The heart is top-normal in size. The pulmonary vascularity is mildly prominent. There is calcification in the wall of the aortic arch. IMPRESSION: COPD. Stable bibasilar atelectasis or pneumonia with moderate-sized pleural effusions. Aortic atherosclerosis. Electronically Signed   By: David  [place] M.D.   On: 02/28/2016 07:14   Dg Chest 2 View  02/25/2016  CLINICAL DATA:  Shortness of breath and weakness. EXAM: CHEST  2 VIEW COMPARISON:  Two-view chest x-ray a 02/23/2016. FINDINGS: Large bilateral pleural effusions and associated airspace disease are not significantly changed. The heart is enlarged. Atherosclerotic calcifications are again noted. IMPRESSION: Similar appearance of large bilateral pleural effusions and bibasilar airspace disease. Electronically Signed   By: Marin Roberts M.D.   On: 02/25/2016 09:21   Dg Chest 2 View  02/23/2016  CLINICAL DATA:  Chest pain for several weeks.  Hypertension EXAM: CHEST  2 VIEW COMPARISON:  April 03, 2012 FINDINGS: There is cardiomegaly. There are bilateral pleural effusions with patchy consolidation in both lung bases. There is pulmonary venous hypertension. There is atherosclerotic calcification in the aorta. No adenopathy apparent. No pneumothorax. There is osteoporosis. There are wedge compression fractures  in the upper lumbar region and lower thoracic region. There is increase in kyphosis. IMPRESSION: Evidence of congestive heart failure. Patchy consolidation in the lung bases raises concern for underlying pneumonia, although these changes may be due entirely to alveolar pulmonary edema. Both entities may exist concurrently. There is aortic atherosclerosis. There are multiple compression fractures in the thoracic and lumbar spine with increase kyphosis. Bones are osteoporotic. Electronically Signed   By: Bretta Bang III M.D.   On: 02/23/2016 11:54    Microbiology: Recent Results (from the past 240 hour(s))  Urine culture     Status: Abnormal   Collection Time: 02/23/16  2:30 PM  Result Value Ref Range Status   Specimen Description URINE, CATHETERIZED  Final   Special Requests NONE  Final   Culture >=100,000 COLONIES/mL ESCHERICHIA COLI (A)  Final   Report Status 02/25/2016 FINAL  Final   Organism ID, Bacteria ESCHERICHIA COLI (A)  Final      Susceptibility   Escherichia coli - MIC*    AMPICILLIN <=2 SENSITIVE Sensitive     CEFAZOLIN <=4 SENSITIVE Sensitive     CEFTRIAXONE <=1 SENSITIVE Sensitive     CIPROFLOXACIN <=0.25 SENSITIVE Sensitive     GENTAMICIN <=1 SENSITIVE Sensitive     IMIPENEM <=0.25 SENSITIVE Sensitive     NITROFURANTOIN <=16 SENSITIVE Sensitive     TRIMETH/SULFA <=20 SENSITIVE Sensitive     AMPICILLIN/SULBACTAM <=2 SENSITIVE Sensitive     PIP/TAZO <=4 SENSITIVE Sensitive     * >=100,000 COLONIES/mL ESCHERICHIA COLI     Labs: Basic Metabolic Panel:  Recent Labs Lab 02/23/16 1758  02/25/16 0226 02/26/16 0236 02/27/16 0520 02/28/16 0415 02/29/16 0428  NA  --   < > 143 143 141 140 138  K  --   < > 4.5 5.1 4.0 4.6 4.5  CL  --   < > 99* 99* 96* 95* 95*  CO2  --   < > 37* 40* 40* 37* 39*  GLUCOSE  --   < > 89 98 100* 102* 113*  BUN  --   < > CREATININE  --   < > 0.42* 0.47 0.56 0.50 0.46  CALCIUM  --   < > 8.4* 8.8* 8.6* 8.7* 8.7*  MG 1.9  --   1.7  --   --   --   --   PHOS  --   --  2.4*  --   --   --   --   < > = values in this interval not displayed. Liver Function Tests:  Recent Labs Lab 02/23/16 1208 02/25/16 0226  AST 30  --   ALT 16  --   ALKPHOS 107  --   BILITOT 1.1  --   PROT 5.8*  --   ALBUMIN 3.1* 2.5*   No results for input(s): LIPASE, AMYLASE in the last 168 hours. No results for input(s): AMMONIA in the last 168 hours. CBC:  Recent Labs Lab 02/23/16 1208 02/27/16 0520 02/28/16 0415 02/29/16 0428  WBC 9.9 7.1 6.7 5.8  HGB 13.7 10.7* 11.4* 11.3*  HCT 46.0 38.4 39.4 38.6  MCV 97.7 102.1* 99.0 98.2  PLT 357 285 274 263   Cardiac Enzymes:  Recent Labs Lab 02/23/16 1758 02/23/16 2159 02/24/16 0102  TROPONINI 0.05* 0.05* 0.05*   BNP: BNP (last 3 results)  Recent Labs  02/23/16 1050 02/24/16 1027  BNP 1063.5* 606.2*    ProBNP (last 3 results) No results for input(s): PROBNP in the last 8760 hours.  CBG: No results for input(s): GLUCAP in the last 168 hours.     Signed:  Jeralyn Bennett MD.  [facility] 02/29/2016, 12:20 PM

## 2016-02-29 NOTE — Progress Notes (Signed)
02/29/2016 3:00 PM Discharge AVS meds taken today and those due this evening reviewed.  Follow-up appointments and when to call md reviewed.  D/C IV and TELE.  Questions and concerns addressed.   D/C home per orders. Kathryne HitchAllen, Mauriana Dann C

## 2016-02-29 NOTE — Care Management Note (Signed)
Case Management Note Donn PieriniKristi Chael Urenda RN, BSN Unit 2W-Case Manager [phone]  Patient Details  Name: Laurie HivesJoyce Manship MRN: [id number] Date of Birth: 1940/10/13  Subjective/Objective:  Pt admitted with hypokalemia and HF                  Action/Plan: PTA pt lived at home with son, wears home 5502- PT eval recommended [facility]NF- however pt and son has refused this and would like to return home with family- spoke with pt, son and family at the bedside regarding HH and DME needs- per pt and family pt has all need DME at [facility]- they do mention that pt needs a new mattress for bed and have referred them to pt's PCP for this- orders have been placed for HHRN/PT/OT/aide- [place] [facility]- per pt's son and pt they do not feel like pt needs HH services at this time and have refused a referral for services- son states that he will f/u with PCP if on return home they change their minds- no HH referral made on discharge- pt/son refused.   Expected Discharge Date:    02/29/16              Expected Discharge Plan:  Home w Home Health Services  In-House Referral:  Clinical Social Work  Discharge planning Services  CM Consult  Post Acute Care Choice:  Home Health Choice offered to:  Patient, Adult Children  DME Arranged:    DME Agency:     HH Arranged:  RN, PT, OT, Nurse's Aide, Patient Refused HH Agency:     Status of Service:  Completed, signed off  If discussed at [employer] Length [employer] Stay Meetings, dates discussed:    Additional Comments:  Darrold SpanWebster, Marsheila Alejo Hall, RN 02/29/2016, 12:41 PM

## 2017-01-10 DIAGNOSIS — N179 Acute kidney failure, unspecified: Secondary | ICD-10-CM

## 2017-01-10 HISTORY — DX: Acute kidney failure, unspecified: N17.9

## 2017-01-13 ENCOUNTER — Encounter (HOSPITAL_COMMUNITY): Payer: Self-pay

## 2017-01-13 DIAGNOSIS — Z9049 Acquired absence of other specified parts of digestive tract: Secondary | ICD-10-CM

## 2017-01-13 DIAGNOSIS — R5381 Other malaise: Secondary | ICD-10-CM | POA: Diagnosis present

## 2017-01-13 DIAGNOSIS — Z7982 Long term (current) use of aspirin: Secondary | ICD-10-CM

## 2017-01-13 DIAGNOSIS — Z66 Do not resuscitate: Secondary | ICD-10-CM | POA: Diagnosis present

## 2017-01-13 DIAGNOSIS — Z681 Body mass index (BMI) 19 or less, adult: Secondary | ICD-10-CM

## 2017-01-13 DIAGNOSIS — G8929 Other chronic pain: Secondary | ICD-10-CM | POA: Diagnosis present

## 2017-01-13 DIAGNOSIS — D62 Acute posthemorrhagic anemia: Secondary | ICD-10-CM | POA: Diagnosis present

## 2017-01-13 DIAGNOSIS — Z90710 Acquired absence of both cervix and uterus: Secondary | ICD-10-CM

## 2017-01-13 DIAGNOSIS — R627 Adult failure to thrive: Secondary | ICD-10-CM | POA: Diagnosis present

## 2017-01-13 DIAGNOSIS — E43 Unspecified severe protein-calorie malnutrition: Secondary | ICD-10-CM | POA: Diagnosis present

## 2017-01-13 DIAGNOSIS — L304 Erythema intertrigo: Secondary | ICD-10-CM | POA: Diagnosis not present

## 2017-01-13 DIAGNOSIS — I2510 Atherosclerotic heart disease of native coronary artery without angina pectoris: Secondary | ICD-10-CM | POA: Diagnosis present

## 2017-01-13 DIAGNOSIS — K219 Gastro-esophageal reflux disease without esophagitis: Secondary | ICD-10-CM | POA: Diagnosis present

## 2017-01-13 DIAGNOSIS — K921 Melena: Secondary | ICD-10-CM | POA: Diagnosis not present

## 2017-01-13 DIAGNOSIS — J449 Chronic obstructive pulmonary disease, unspecified: Secondary | ICD-10-CM | POA: Diagnosis present

## 2017-01-13 DIAGNOSIS — I252 Old myocardial infarction: Secondary | ICD-10-CM

## 2017-01-13 DIAGNOSIS — E860 Dehydration: Secondary | ICD-10-CM | POA: Diagnosis present

## 2017-01-13 DIAGNOSIS — M810 Age-related osteoporosis without current pathological fracture: Secondary | ICD-10-CM | POA: Diagnosis present

## 2017-01-13 DIAGNOSIS — I5032 Chronic diastolic (congestive) heart failure: Secondary | ICD-10-CM | POA: Diagnosis present

## 2017-01-13 DIAGNOSIS — N179 Acute kidney failure, unspecified: Secondary | ICD-10-CM | POA: Diagnosis present

## 2017-01-13 DIAGNOSIS — B9681 Helicobacter pylori [H. pylori] as the cause of diseases classified elsewhere: Secondary | ICD-10-CM | POA: Diagnosis present

## 2017-01-13 DIAGNOSIS — Z515 Encounter for palliative care: Secondary | ICD-10-CM | POA: Diagnosis not present

## 2017-01-13 DIAGNOSIS — G9340 Encephalopathy, unspecified: Secondary | ICD-10-CM | POA: Diagnosis present

## 2017-01-13 DIAGNOSIS — Z833 Family history of diabetes mellitus: Secondary | ICD-10-CM

## 2017-01-13 DIAGNOSIS — E876 Hypokalemia: Secondary | ICD-10-CM | POA: Diagnosis not present

## 2017-01-13 DIAGNOSIS — K2211 Ulcer of esophagus with bleeding: Principal | ICD-10-CM | POA: Diagnosis present

## 2017-01-13 DIAGNOSIS — K449 Diaphragmatic hernia without obstruction or gangrene: Secondary | ICD-10-CM | POA: Diagnosis present

## 2017-01-13 DIAGNOSIS — J9611 Chronic respiratory failure with hypoxia: Secondary | ICD-10-CM | POA: Diagnosis present

## 2017-01-13 DIAGNOSIS — Z8249 Family history of ischemic heart disease and other diseases of the circulatory system: Secondary | ICD-10-CM

## 2017-01-13 DIAGNOSIS — Z9981 Dependence on supplemental oxygen: Secondary | ICD-10-CM

## 2017-01-13 LAB — COMPREHENSIVE METABOLIC PANEL
ALT: 16 U/L (ref 14–54)
ANION GAP: 14 (ref 5–15)
AST: 35 U/L (ref 15–41)
Albumin: 3 g/dL — ABNORMAL LOW (ref 3.5–5.0)
Alkaline Phosphatase: 37 U/L — ABNORMAL LOW (ref 38–126)
BILIRUBIN TOTAL: 1.4 mg/dL — AB (ref 0.3–1.2)
BUN: 41 mg/dL — AB (ref 6–20)
CO2: 26 mmol/L (ref 22–32)
Calcium: 8.8 mg/dL — ABNORMAL LOW (ref 8.9–10.3)
Chloride: 98 mmol/L — ABNORMAL LOW (ref 101–111)
Creatinine, Ser: 1.56 mg/dL — ABNORMAL HIGH (ref 0.44–1.00)
GFR calc Af Amer: 36 mL/min — ABNORMAL LOW (ref >60)
GFR calc non Af Amer: 31 mL/min — ABNORMAL LOW (ref >60)
GLUCOSE: 141 mg/dL — AB (ref 65–99)
POTASSIUM: 4.4 mmol/L (ref 3.5–5.1)
Sodium: 138 mmol/L (ref 135–145)
Total Protein: 4.9 g/dL — ABNORMAL LOW (ref 6.5–8.1)

## 2017-01-13 LAB — CBC WITH DIFFERENTIAL/PLATELET
BASOS ABS: 0 10*3/uL (ref 0.0–0.1)
Basophils Relative: 0 %
EOS PCT: 0 %
Eosinophils Absolute: 0 10*3/uL (ref 0.0–0.7)
HEMATOCRIT: 36.5 % (ref 36.0–46.0)
Hemoglobin: 9.9 g/dL — ABNORMAL LOW (ref 12.0–15.0)
LYMPHS PCT: 7 %
Lymphs Abs: 0.8 10*3/uL (ref 0.7–4.0)
MCH: 22.4 pg — ABNORMAL LOW (ref 26.0–34.0)
MCHC: 27.1 g/dL — ABNORMAL LOW (ref 30.0–36.0)
MCV: 82.8 fL (ref 78.0–100.0)
MONO ABS: 0.9 10*3/uL (ref 0.1–1.0)
MONOS PCT: 8 %
NEUTROS ABS: 9.1 10*3/uL — AB (ref 1.7–7.7)
Neutrophils Relative %: 85 %
PLATELETS: 188 10*3/uL (ref 150–400)
RBC: 4.41 MIL/uL (ref 3.87–5.11)
RDW: 17.6 % — AB (ref 11.5–15.5)
WBC: 10.8 10*3/uL — ABNORMAL HIGH (ref 4.0–10.5)

## 2017-01-13 NOTE — ED Triage Notes (Signed)
PT brought in by family who report pt is not eating, has been sleeping more than normal, lethargic, and slurred speech at times. Pt repots having dark loose stools x 3 days.

## 2017-01-14 ENCOUNTER — Inpatient Hospital Stay (HOSPITAL_COMMUNITY)
Admission: EM | Admit: 2017-01-14 | Discharge: 2017-01-20 | DRG: 380 | Disposition: A | Discharge status: Hospice | Payer: Medicare Other | Attending: Internal Medicine | Admitting: Internal Medicine

## 2017-01-14 ENCOUNTER — Encounter (HOSPITAL_COMMUNITY): Payer: Self-pay | Admitting: General Practice

## 2017-01-14 ENCOUNTER — Emergency Department (HOSPITAL_COMMUNITY): Payer: Medicare Other

## 2017-01-14 DIAGNOSIS — K449 Diaphragmatic hernia without obstruction or gangrene: Secondary | ICD-10-CM | POA: Diagnosis present

## 2017-01-14 DIAGNOSIS — Z90710 Acquired absence of both cervix and uterus: Secondary | ICD-10-CM | POA: Diagnosis not present

## 2017-01-14 DIAGNOSIS — E860 Dehydration: Secondary | ICD-10-CM | POA: Diagnosis present

## 2017-01-14 DIAGNOSIS — I252 Old myocardial infarction: Secondary | ICD-10-CM | POA: Diagnosis not present

## 2017-01-14 DIAGNOSIS — Z9981 Dependence on supplemental oxygen: Secondary | ICD-10-CM

## 2017-01-14 DIAGNOSIS — Z9049 Acquired absence of other specified parts of digestive tract: Secondary | ICD-10-CM | POA: Diagnosis not present

## 2017-01-14 DIAGNOSIS — K922 Gastrointestinal hemorrhage, unspecified: Secondary | ICD-10-CM | POA: Diagnosis not present

## 2017-01-14 DIAGNOSIS — M810 Age-related osteoporosis without current pathological fracture: Secondary | ICD-10-CM | POA: Diagnosis present

## 2017-01-14 DIAGNOSIS — J449 Chronic obstructive pulmonary disease, unspecified: Secondary | ICD-10-CM | POA: Diagnosis not present

## 2017-01-14 DIAGNOSIS — K2211 Ulcer of esophagus with bleeding: Secondary | ICD-10-CM | POA: Diagnosis not present

## 2017-01-14 DIAGNOSIS — I5032 Chronic diastolic (congestive) heart failure: Secondary | ICD-10-CM

## 2017-01-14 DIAGNOSIS — E43 Unspecified severe protein-calorie malnutrition: Secondary | ICD-10-CM

## 2017-01-14 DIAGNOSIS — J90 Pleural effusion, not elsewhere classified: Secondary | ICD-10-CM

## 2017-01-14 DIAGNOSIS — K921 Melena: Secondary | ICD-10-CM | POA: Diagnosis present

## 2017-01-14 DIAGNOSIS — Z833 Family history of diabetes mellitus: Secondary | ICD-10-CM | POA: Diagnosis not present

## 2017-01-14 DIAGNOSIS — I2510 Atherosclerotic heart disease of native coronary artery without angina pectoris: Secondary | ICD-10-CM | POA: Diagnosis not present

## 2017-01-14 DIAGNOSIS — Z8249 Family history of ischemic heart disease and other diseases of the circulatory system: Secondary | ICD-10-CM | POA: Diagnosis not present

## 2017-01-14 DIAGNOSIS — R634 Abnormal weight loss: Secondary | ICD-10-CM

## 2017-01-14 DIAGNOSIS — R627 Adult failure to thrive: Secondary | ICD-10-CM | POA: Diagnosis present

## 2017-01-14 DIAGNOSIS — D62 Acute posthemorrhagic anemia: Secondary | ICD-10-CM | POA: Diagnosis not present

## 2017-01-14 DIAGNOSIS — K2210 Ulcer of esophagus without bleeding: Secondary | ICD-10-CM

## 2017-01-14 DIAGNOSIS — R5381 Other malaise: Secondary | ICD-10-CM | POA: Diagnosis present

## 2017-01-14 DIAGNOSIS — J9611 Chronic respiratory failure with hypoxia: Secondary | ICD-10-CM | POA: Diagnosis present

## 2017-01-14 DIAGNOSIS — Z681 Body mass index (BMI) 19 or less, adult: Secondary | ICD-10-CM | POA: Diagnosis not present

## 2017-01-14 DIAGNOSIS — L304 Erythema intertrigo: Secondary | ICD-10-CM

## 2017-01-14 DIAGNOSIS — R112 Nausea with vomiting, unspecified: Secondary | ICD-10-CM

## 2017-01-14 DIAGNOSIS — G8929 Other chronic pain: Secondary | ICD-10-CM | POA: Diagnosis present

## 2017-01-14 DIAGNOSIS — G9340 Encephalopathy, unspecified: Secondary | ICD-10-CM | POA: Diagnosis not present

## 2017-01-14 DIAGNOSIS — R195 Other fecal abnormalities: Secondary | ICD-10-CM

## 2017-01-14 DIAGNOSIS — I509 Heart failure, unspecified: Secondary | ICD-10-CM

## 2017-01-14 DIAGNOSIS — D72829 Elevated white blood cell count, unspecified: Secondary | ICD-10-CM | POA: Diagnosis present

## 2017-01-14 DIAGNOSIS — Z7982 Long term (current) use of aspirin: Secondary | ICD-10-CM | POA: Diagnosis not present

## 2017-01-14 DIAGNOSIS — Z7189 Other specified counseling: Secondary | ICD-10-CM

## 2017-01-14 DIAGNOSIS — N179 Acute kidney failure, unspecified: Secondary | ICD-10-CM | POA: Diagnosis not present

## 2017-01-14 DIAGNOSIS — Z515 Encounter for palliative care: Secondary | ICD-10-CM

## 2017-01-14 DIAGNOSIS — D500 Iron deficiency anemia secondary to blood loss (chronic): Secondary | ICD-10-CM

## 2017-01-14 DIAGNOSIS — D508 Other iron deficiency anemias: Secondary | ICD-10-CM

## 2017-01-14 DIAGNOSIS — K219 Gastro-esophageal reflux disease without esophagitis: Secondary | ICD-10-CM | POA: Diagnosis not present

## 2017-01-14 HISTORY — DX: Acute kidney failure, unspecified: N17.9

## 2017-01-14 HISTORY — DX: Dyspnea, unspecified: R06.00

## 2017-01-14 LAB — CBC
HEMATOCRIT: 35.6 % — AB (ref 36.0–46.0)
HEMOGLOBIN: 9.7 g/dL — AB (ref 12.0–15.0)
MCH: 22.6 pg — ABNORMAL LOW (ref 26.0–34.0)
MCHC: 27.2 g/dL — ABNORMAL LOW (ref 30.0–36.0)
MCV: 82.8 fL (ref 78.0–100.0)
Platelets: 155 10*3/uL (ref 150–400)
RBC: 4.3 MIL/uL (ref 3.87–5.11)
RDW: 17.7 % — ABNORMAL HIGH (ref 11.5–15.5)
WBC: 11.3 10*3/uL — AB (ref 4.0–10.5)

## 2017-01-14 LAB — URINALYSIS, ROUTINE W REFLEX MICROSCOPIC
BILIRUBIN URINE: NEGATIVE
Glucose, UA: NEGATIVE mg/dL
Hgb urine dipstick: NEGATIVE
KETONES UR: NEGATIVE mg/dL
Leukocytes, UA: NEGATIVE
Nitrite: NEGATIVE
PH: 5 (ref 5.0–8.0)
PROTEIN: 30 mg/dL — AB
Specific Gravity, Urine: 1.023 (ref 1.005–1.030)

## 2017-01-14 LAB — POC OCCULT BLOOD, ED: FECAL OCCULT BLD: POSITIVE — AB

## 2017-01-14 LAB — BASIC METABOLIC PANEL
ANION GAP: 16 — AB (ref 5–15)
BUN: 42 mg/dL — ABNORMAL HIGH (ref 6–20)
CO2: 25 mmol/L (ref 22–32)
Calcium: 8.7 mg/dL — ABNORMAL LOW (ref 8.9–10.3)
Chloride: 98 mmol/L — ABNORMAL LOW (ref 101–111)
Creatinine, Ser: 1.44 mg/dL — ABNORMAL HIGH (ref 0.44–1.00)
GFR calc Af Amer: 40 mL/min — ABNORMAL LOW (ref >60)
GFR, EST NON AFRICAN AMERICAN: 34 mL/min — AB (ref >60)
GLUCOSE: 107 mg/dL — AB (ref 65–99)
Potassium: 4.3 mmol/L (ref 3.5–5.1)
Sodium: 139 mmol/L (ref 135–145)

## 2017-01-14 LAB — TYPE AND SCREEN
ABO/RH(D): O POS
Antibody Screen: NEGATIVE

## 2017-01-14 LAB — PROTIME-INR
INR: 1.26
PROTHROMBIN TIME: 15.9 s — AB (ref 11.4–15.2)

## 2017-01-14 LAB — HEMOGLOBIN AND HEMATOCRIT, BLOOD
HEMATOCRIT: 32.2 % — AB (ref 36.0–46.0)
Hemoglobin: 8.7 g/dL — ABNORMAL LOW (ref 12.0–15.0)

## 2017-01-14 MED ORDER — IPRATROPIUM-ALBUTEROL 0.5-2.5 (3) MG/3ML IN SOLN: 3.0000 mL | RESPIRATORY_TRACT | Status: DC | PRN

## 2017-01-14 MED ORDER — BOOST / RESOURCE BREEZE PO LIQD
1.0000 | Freq: Three times a day (TID) | ORAL | Status: DC
  Administered 2017-01-14 – 2017-01-16 (×3): 1 via ORAL

## 2017-01-14 MED ORDER — ONDANSETRON HCL 4 MG PO TABS
4.0000 mg | ORAL_TABLET | Freq: Four times a day (QID) | ORAL | Status: DC | PRN
  Administered 2017-01-14: 4 mg via ORAL
  Filled 2017-01-14 (×2): qty 1

## 2017-01-14 MED ORDER — ACETAMINOPHEN 325 MG PO TABS: 650.0000 mg | ORAL_TABLET | Freq: Four times a day (QID) | ORAL | Status: DC | PRN

## 2017-01-14 MED ORDER — ONDANSETRON HCL 4 MG/2ML IJ SOLN
4.0000 mg | Freq: Four times a day (QID) | INTRAMUSCULAR | Status: DC | PRN
  Administered 2017-01-18: 4 mg via INTRAVENOUS
  Filled 2017-01-14: qty 2

## 2017-01-14 MED ORDER — SODIUM CHLORIDE 0.9 % IV SOLN
INTRAVENOUS | Status: DC
  Administered 2017-01-14 (×2): via INTRAVENOUS

## 2017-01-14 MED ORDER — PANTOPRAZOLE SODIUM 40 MG IV SOLR
8.0000 mg/h | INTRAVENOUS | Status: DC
  Administered 2017-01-14 (×3): 8 mg/h via INTRAVENOUS
  Filled 2017-01-14 (×6): qty 80

## 2017-01-14 MED ORDER — HYDROCODONE-ACETAMINOPHEN 5-325 MG PO TABS
1.0000 | ORAL_TABLET | Freq: Four times a day (QID) | ORAL | Status: DC | PRN
  Administered 2017-01-14 (×2): 1 via ORAL
  Filled 2017-01-14 (×2): qty 1

## 2017-01-14 MED ORDER — SODIUM CHLORIDE 0.9 % IV BOLUS (SEPSIS)
500.0000 mL | Freq: Once | INTRAVENOUS | Status: AC
  Administered 2017-01-14: 500 mL via INTRAVENOUS

## 2017-01-14 MED ORDER — SODIUM CHLORIDE 0.9 % IV SOLN
80.0000 mg | Freq: Once | INTRAVENOUS | Status: AC
  Administered 2017-01-14: 80 mg via INTRAVENOUS
  Filled 2017-01-14: qty 80

## 2017-01-14 MED ORDER — ACETAMINOPHEN 650 MG RE SUPP: 650.0000 mg | Freq: Four times a day (QID) | RECTAL | Status: DC | PRN

## 2017-01-14 MED ORDER — ADULT MULTIVITAMIN W/MINERALS CH
1.0000 | ORAL_TABLET | Freq: Every day | ORAL | Status: DC
  Administered 2017-01-14 – 2017-01-19 (×5): 1 via ORAL
  Filled 2017-01-14 (×5): qty 1

## 2017-01-14 MED ORDER — PANTOPRAZOLE SODIUM 40 MG IV SOLR: 40.0000 mg | Freq: Two times a day (BID) | INTRAVENOUS | Status: DC

## 2017-01-14 NOTE — ED Notes (Signed)
Pt back in room from xray 

## 2017-01-14 NOTE — Consult Note (Signed)
[facility] Consult: 12:21 PM 01/14/2017  LOS: 0 days    Referring Provider: Dr Broadus John Primary Care Physician:  Bing Matter PA-C at [place] Primary Gastroenterologist:  unassigned    Reason for Consultation:  Anemia   HPI: Laurie Sparks is a 76 y.o. female.  PMH diastolic CHF.  Hx CAD and MI.  Carotid artery disease.  COPD requiring home oxygen.  Hiatal hernia. S/p at least 2 surgeries to correct large, paraesophageal hernia ~ 2007 in [place] and at [place] which ultimately failed. S/p cholecystectomy.   Chronic failure to thrive. Hip fractureORIF 2010.   Chronic abdominal and musculoskeletal pain, takes ~ 6 Hydrocodone daily.  Cholangitis with sepsis 03/2012, (this after cholecystectomy and failed attempt at [facility] repair).   CT 03/2012: Distorted anatomy with very large hiatal hernia containing stomach, multiple loops of bowel, and pancreas, incompletely visualized.  No evidence of bowel obstruction. Marked dilatation of the common bile duct with at least two intraductal lesions, as described above, including a 2.7 x 1.9 cm obstructing mass near the ampulla. Moderate to severe intrahepatic ductal dilatation S/p PTC drain placement 03/2012.  Status post IR performed sphincterotomy, expulsion of CBD stone and 10 French biliary catheter exchange 04/24/12.  Follow-up cholangiogram 05/05/12 showed patent 12 mm bile duct. 05/20/12 Biliary catheter exchanged along with bile duct brushings/cytology  (no malignant cells).  06/02/12 the drain was removed.    Pt has chronic abd pain and chronic nausea.  Does not vomit a lot.  In recent 2 weeks, not eating, has lost her appetite.  abd pain not increased.  Losing additional weight but already severely cachectic.  Very weak, past 2 days unable to get onto bedside  commode  Current weight is 70#.  Lives with her son.  BMs brown, and uses suppositories to have BMs 2 to 3 x weekly.  Patient helps cleaning up the bedside commode and he has never seen black/melenic or bloody stools.  Son brought her to ED and she was admitted.  SLP and palliative care consults pending.   Baseline hemoglobin is about 11, one year ago. At arrival yesterday hemoglobin was 9.9, 9.7 today. MCV normal. Blood cell count 11.3.  + AKI.  LFTs with t bil 1.4, albumin 3.0 but o/w normal.   FOBT +. AAS films: Unchanged bilateral pleural effusion and bilateral airspace disease. Nonspecific bowel gas pattern with general positive bowel gas. Diffuse osteopenia/osteoporosis. Kyphosis.      Past Medical History:  Diagnosis Date  . AKI (acute kidney injury) ([place]) 01/2017  . CHF (congestive heart failure) ([place])   . COPD (chronic obstructive pulmonary disease) ([place])   . Coronary artery disease   . Depression   . Dyspnea   . Insomnia   . Myocardial infarct ([place])   . Reflux     Past Surgical History:  Procedure Laterality Date  . ABDOMINAL HYSTERECTOMY    . ANKLE SURGERY    . CHOLECYSTECTOMY    . HIATAL HERNIA REPAIR      Prior to Admission medications   Medication Sig Start  Date End Date Taking? Authorizing Provider  aspirin EC 81 MG tablet Take 81 mg by mouth daily.   Yes [provider]  carvedilol (COREG) 12.5 MG tablet Take 12.5 mg by mouth 2 (two) times daily with a meal.   Yes [provider]  ergocalciferol (VITAMIN D2) 50000 units capsule Take 50,000 Units by mouth once a week. Take on Tuesdays   Yes [provider]  furosemide (LASIX) 20 MG tablet Take 1 tablet (20 mg total) by mouth daily. 02/29/16  Yes Kelvin Cellar, MD  HYDROcodone-acetaminophen (NORCO) 7.5-325 MG per tablet Take 1 tablet by mouth every 4 (four) hours.  02/27/12  Yes [provider]  nitroGLYCERIN (NITROSTAT) 0.4 MG SL tablet Place 0.4 mg under the tongue every 5  (five) minutes as needed. For chest pain   Yes [provider]  omeprazole (PRILOSEC) 40 MG capsule Take 40 mg by mouth daily.   Yes [provider]  PROAIR HFA 108 [phone] Base) MCG/ACT inhaler Inhale 1-2 puffs into the lungs every 6 (six) hours as needed for wheezing.  01/09/17  Yes [provider]  sertraline (ZOLOFT) 100 MG tablet Take 200 mg by mouth daily.    Yes [provider]  traZODone (DESYREL) 50 MG tablet Take 75 mg by mouth at bedtime.   Yes [provider]  albuterol (PROVENTIL) (5 MG/ML) 0.5% nebulizer solution Take 0.5 mLs (2.5 mg total) by nebulization 3 (three) times daily as needed for wheezing. Patient not taking: Reported on 01/14/2017 04/07/12 01/14/17  Hosie Poisson, MD  feeding supplement (BOOST / RESOURCE BREEZE) LIQD Take 1 Container by mouth 3 (three) times daily between meals. Patient not taking: Reported on 01/14/2017 02/29/16   Kelvin Cellar, MD  ipratropium (ATROVENT) 0.02 % nebulizer solution Take 2.5 mLs (0.5 mg total) by nebulization 3 (three) times daily as needed for wheezing. Patient not taking: Reported on 01/14/2017 04/07/12 01/14/17  Hosie Poisson, MD  Multiple Vitamin (MULTIVITAMIN WITH MINERALS) TABS Take 1 tablet by mouth daily. Patient not taking: Reported on 01/14/2017 04/07/12   Hosie Poisson, MD    Scheduled Meds: . [START ON 01/17/2017] pantoprazole  40 mg Intravenous Q12H   Infusions: . sodium chloride 75 mL/hr at 01/14/17 0559  . pantoprozole (PROTONIX) infusion 8 mg/hr (01/14/17 1138)   PRN Meds: acetaminophen **OR** acetaminophen, HYDROcodone-acetaminophen, ipratropium-albuterol, ondansetron **OR** ondansetron (ZOFRAN) IV   Allergies as of 01/13/2017 - Review Complete 01/13/2017  Allergen Reaction Noted  . Ambien [zolpidem tartrate] Other (See Comments) 03/24/2012  . Ativan [lorazepam] Other (See Comments) 04/24/2012  . Lactose intolerance (gi)  05/20/2012  . Other  03/24/2012    Family History  Problem  Relation Age of Onset  . Heart failure Mother   . Diabetes Mother     Social History   Social History  . Marital status: Single    Spouse name: N/A  . Number of children: N/A  . Years of education: N/A   Occupational History  . Not on file.   Social History Main Topics  . Smoking status: Never Smoker  . Smokeless tobacco: Never Used  . Alcohol use No  . Drug use: No  . Sexual activity: Not on file   Other Topics Concern  . Not on file   Social History Narrative  . No narrative on file    REVIEW OF SYSTEMS: Constitutional:  Acute on chronic weakness ENT:  No nose bleeds Pulm:  Stable dyspnea and cough. Chronic oxygen. CV:  No palpitations, no LE  edema. No chest pain. GU:  No hematuria, no frequency GI:  Per HPI Heme:  No unusual bleeding or bruising.   Transfusions:  Does not recall previous transfusions. Neuro:  No headaches, no peripheral tingling or numbness Derm:  No itching, no rash or sores.  Endocrine:  No sweats or chills.  No polyuria or dysuria Immunization:  Not queried. Travel:  None beyond local counties in last few months.    PHYSICAL EXAM: Vital signs in last 24 hours: Vitals:   01/14/17 0706 01/14/17 1006  BP: (!) 95/53 (!) 108/55  Pulse:  82  Resp: 14 14  Temp: 97.1 F (36.2 C) 98.5 F (36.9 C)   Wt Readings from Last 3 Encounters:  01/13/17 31.8 kg (70 lb)  02/29/16 30.1 kg (66 lb 6.4 oz)  06/02/12 40.4 kg (89 lb)    General: Extremely cachectic and chronically ill-appearing elderly WF. She is pale. She is in a slightly fetal position on the bed but is awake and alert and answers appropriately. Head:  No facial asymmetry or swelling.  Eyes:  No scleral icterus. Conjunctiva is pale. Ears:  Not hard of hearing.  Nose:  No congestion or discharge Mouth:  Poor dentition.  Slightly dry mucous membranes. Tongue midline. Neck:  No JVD, no masses, no thyromegaly Lungs:  Diminished but clear bilaterally. No labored breathing or  cough. Heart: RRR. No MRG. S1, S2 present. Abdomen:  Soft. No masses. No hepatosplenomegaly. Tender. Bowel sounds active..   Rectal: Do not see any sacral decubiti. No rectal masses. Some external hemorrhoids. Soft stool is brown and trace FOBT positive   Musc/Skeltl: Kyphosis. Severe osteopenic appearance. Extremities:  No CCE. Advance muscular wasting.  Neurologic:  Alert. Oriented times 3. Appropriate. Moves all 4 limbs, strength was not tested. No tremor. Skin:  No sores, rashes or telangiectasia. No significant bruising or purpura. Tattoos:  None seen Nodes:  No cervical adenopathy.   Psych:  Cooperative, flat affect. Calm.  Intake/Output from previous day: No intake/output data recorded. Intake/Output this shift: No intake/output data recorded.  LAB RESULTS:  Recent Labs  01/13/17 2238 01/14/17 0538  WBC 10.8* 11.3*  HGB 9.9* 9.7*  HCT 36.5 35.6*  PLT 188 155   BMET Lab Results  Component Value Date   NA 139 01/14/2017   NA 138 01/13/2017   NA 138 02/29/2016   K 4.3 01/14/2017   K 4.4 01/13/2017   K 4.5 02/29/2016   CL 98 (L) 01/14/2017   CL 98 (L) 01/13/2017   CL 95 (L) 02/29/2016   CO2 25 01/14/2017   CO2 26 01/13/2017   CO2 39 (H) 02/29/2016   GLUCOSE 107 (H) 01/14/2017   GLUCOSE 141 (H) 01/13/2017   GLUCOSE 113 (H) 02/29/2016   BUN 42 (H) 01/14/2017   BUN 41 (H) 01/13/2017   BUN 11 02/29/2016   CREATININE 1.44 (H) 01/14/2017   CREATININE 1.56 (H) 01/13/2017   CREATININE 0.46 02/29/2016   CALCIUM 8.7 (L) 01/14/2017   CALCIUM 8.8 (L) 01/13/2017   CALCIUM 8.7 (L) 02/29/2016   LFT  Recent Labs  01/13/17 2238  PROT 4.9*  ALBUMIN 3.0*  AST 35  ALT 16  ALKPHOS 37*  BILITOT 1.4*   PT/INR Lab Results  Component Value Date   INR 1.26 01/14/2017   INR 0.93 05/20/2012   INR 1.03 05/05/2012   Hepatitis Panel No results for input(s): HEPBSAG, HCVAB, HEPAIGM, HEPBIGM in the last 72 hours. C-Diff No components found for: CDIFF Lipase  Component Value Date/Time   LIPASE 139 (H) 04/01/2012 0415    Drugs of Abuse  No results found for: LABOPIA, COCAINSCRNUR, LABBENZ, AMPHETMU, THCU, LABBARB   RADIOLOGY STUDIES: Dg Abd Acute W/chest  Result Date: 01/14/2017 CLINICAL DATA:  Abdominal pain, nausea, vomiting and diarrhea for several days. EXAM: DG ABDOMEN ACUTE W/ 1V CHEST COMPARISON:  Chest radiographs 02/28/2016 FINDINGS: Bilateral pleural effusions and bibasilar airspace disease, unchanged from prior exam. Cardiomediastinal contours are obscured. Pulmonary vasculature is stable. Patient is kyphotic. Generalized paucity of bowel gas. Minimal air within the sigmoid colon. No significant colonic stool is visualized. Chain sutures suspected in the left upper quadrant. Pelvic calcifications likely phleboliths. No evidence of free air. The bones are diffusely under mineralized. IMPRESSION: 1. Unchanged appearance of the chest with bilateral pleural effusions and bibasilar airspace disease. 2. Nonspecific bowel-gas pattern with generalized paucity of bowel gas. No evidence of free air. 3. Diffuse osteopenia/osteoporosis. Electronically Signed   By: Jeb Levering M.D.   On: 01/14/2017 02:34    ENDOSCOPIC STUDIES: Patient does not recall ever having had a previous colonoscopy.  She has had remote upper endoscopies.  IMPRESSION:   *  FOBT +.  Normocytic anemia in pt with large HH and previous unsuccessful attempts to repair the [facility].  *  Advanced cachexia and malnutrition. Although the acute problems of taking little to no PO have been present for 2 weeks, her physical state suggests a long standing problem with malnutrition.  *  Anorexia, chronic nausea. Suspect this has something to do with her hiatal hernia. She could have some underlying GI or other type of malignancy.   PLAN:     *  Dr. Silverio Decamp will be seeing the patient. Do not think the patient could tolerate a colonoscopy. Perhaps we could pursue upper endoscopy. Question  whether we should begin investigation with CT scan of chest, abdomen pelvis? This could rule out gross neoplastic process and give [place] an idea of her current GI anatomy.   Azucena Freed  01/14/2017, 12:21 PM Pager: [phone]   Attending physician's note   I have taken a history, examined the patient and reviewed the chart. I agree with the Advanced Practitioner's note, impression and recommendations.  76 year old female straightened of large hiatal hernia with chronic dysphagia, intermittent vomiting, regurgitation, severe malnutrition and failure to thrive. Denies any overt GI bleed Her symptoms could be related to large hiatal hernia, will schedule for EGD tomorrow to evaluate and exclude peptic ulcer disease or chronic gastric volvulus  Patient is unlikely to tolerate bowel prep to do colonoscopy Discussed the plan with patient and her son The risks and benefits as well as alternatives of endoscopic procedure(s) have been discussed and reviewed. All questions answered. The patient agrees to proceed.  Further workup based on findings of EGD  K Denzil Magnuson, MD [phone] Mon-Fri 8a-5p [phone] after 5p, weekends, holidays

## 2017-01-14 NOTE — Progress Notes (Signed)
No charge note:   Palliative consult received.   Message left for patient's son- Doloris HallJoe Klier- requesting return call to arrange meeting.   Ocie BobKasie Mahan, AGNP-C Palliative Medicine  Please call Palliative Medicine team phone with any questions [phone]. For individual providers please see AMION.

## 2017-01-14 NOTE — Progress Notes (Signed)
Initial Nutrition Assessment  DOCUMENTATION CODES:   Severe malnutrition in context of chronic illness, Underweight  INTERVENTION:   -Request new weight -Order Boost Breeze TID with meals -Recommend addition of MVI -Pt with significant cachexia with severe muscle wasting and subcutaneous fat wasting in all areas. Pt with likely long-standing chronic malnutrition. If aggressive intervention warranted, pt would likely benefit from nutrition support. Further recommendations on follow-up  NUTRITION DIAGNOSIS:   Malnutrition (SEVERE) related to  (COPD, chronic N/V with large hiatal hernia, paraesophageal hernia) as evidenced by severe depletion of body fat, severe depletion of muscle mass.  GOAL:   Patient will meet greater than or equal to 90% of their needs  MONITOR:   PO intake, Supplement acceptance, Labs, Weight trends  REASON FOR ASSESSMENT:   Malnutrition Screening Tool    ASSESSMENT:   76 yo female admitted with FTT, progressive weakness, increased lethargy, not eating or drinking. Pt with hx of COPD on home oxygen (3L), CHF, CAD, GERD.  Pt with hx of chronic abdominal pain and nausea, vomiting after meals. Noted pt also with large hiatal hernia containing stomach  GI following with plan for EGD tomorrow. Pt with hx of chronic abdominal pain and nausea. Family has also reported a chronic hx of vomiting after eating meals. Pt with hx of large hiatal hernia containing the stomach. Noted pt with at least surgeries in past to correct large paraesophageal hernia. SLP evaluated pt today, pt with no oropharyngeal dysphagia identified Noted palliative care consult pending  Pt reports she has not taken any liquids today. Currently on CL diet. Pt unable to tell writer how appetite has been, how many meals she eats per day, if she takes supplements or what she typically eats on visit. Noted per MD note, son reporting pt only taking few sips/bites of food for the past week, pushing the  rest of the food away.  Pt is underweight, unable to tell writer her wt history on visit today. Weight of 70 pounds in computer, taken on admission. Recommend re-weighing.   Nutrition-Focused physical exam completed. Findings are severe fat depletion, severe muscle depletion, and no edema.   Labs: Creatinine 1.44 (improving) Meds: NS at 75 ml/hr   Diet Order:  Diet clear liquid Room service appropriate? Yes; Fluid consistency: Thin Diet NPO time specified  Skin:  Reviewed, no issues  Last BM:  01/13/17  Height:   Ht Readings from Last 1 Encounters:  01/14/17 5' (1.524 m)    Weight:   Wt Readings from Last 1 Encounters:  01/13/17 70 lb (31.8 kg)    Ideal Body Weight:  47.7 kg  BMI:  Body mass index is 13.67 kg/m.  Estimated Nutritional Needs:   Kcal:  1100-1300 kcals  Protein:  55-65 g  Fluid:  >/= 1.1 L  EDUCATION NEEDS:   No education needs identified at this time  Romelle StarcherCate Shellby Schlink MS, RD, LDN [phone] Pager  [phone] Weekend/On-Call Pager

## 2017-01-14 NOTE — H&P (Signed)
History and Physical    Harshita Bernales ZOX:[id number] DOB: 11-08-1940 DOA: 01/14/2017  Referring MD/NP/PA: Dr. Blinda Leatherwood PCP: Halina Maidens., MD (Inactive)  Patient coming from: home  Chief Complaint: Not eating or drinking  HPI: Olivea Sparks is a 76 y.o. female with medical history significant of diastolic CHF, COPD, oxygen dependent on 3 L nasal oxygen, CAD, and GERD; who was brought in by her son's for not eating and drinking. History is mostly obtained from the patient's son so present at bedside. At baseline the patient is noted to be able to transition to a bedside commode, but is mostly sedimentary throughout the day. For the last few weeks, the patient has been having progressively  worsening generalized weakness. In the last week, she has only taking a few sips or bites of any food or drink given to her and pushing the rest back.  Associated symptoms include increased lethargy, unknown amount of weight loss, and dark loose stools. Family notes a chronic history of vomiting after eating meals that is unchanged. Denies having any significant fever, chills, abdominal pain, hematemesis, or NSAID use. Family also makes note of a history of a hiatal hernia, but state GI had declined to do any endoscopy secondary to the patient's heart and respiratory function.  Patient does take a aspirin daily.  ED Course: Upon admission to the emergency department patient was seen to be afebrile, pulse 7078, respirations 16-21, blood pressures maintained O2 saturations as low as 88% on 3 liter of [place] oxygen. Labs revealed WBC 10.8, hemoglobin 9.9(baseline 11), BUN 41, creatinine 1.56, INR 1.26. Urinalysis was negative for any signs of infection. Patient was started on Protonix drip and given half liter bolus of IV fluids. Stool guaiac was noted be positive.  Review of Systems: As per HPI otherwise 10 point review of systems negative.   Past Medical History:  Diagnosis Date  . CHF (congestive heart failure)  (HCC)   . COPD (chronic obstructive pulmonary disease) (HCC)   . Coronary artery disease   . Depression   . Insomnia   . Myocardial infarct (HCC)   . Reflux     Past Surgical History:  Procedure Laterality Date  . ABDOMINAL HYSTERECTOMY    . ANKLE SURGERY    . CHOLECYSTECTOMY       reports that she has never smoked. She has never used smokeless tobacco. She reports that she does not drink alcohol or use drugs.  Allergies  Allergen Reactions  . Ambien [Zolpidem Tartrate] Other (See Comments)    Hallucinate   . Ativan [Lorazepam] Other (See Comments)    Hallucinations   . Lactose Intolerance (Gi)   . Other     Base metal and something else    Family History  Problem Relation Age of Onset  . Heart failure Mother   . Diabetes Mother     Prior to Admission medications   Medication Sig Start Date End Date Taking? Authorizing Provider  aspirin EC 81 MG tablet Take 81 mg by mouth daily.   Yes [provider]  carvedilol (COREG) 12.5 MG tablet Take 12.5 mg by mouth 2 (two) times daily with a meal.   Yes [provider]  ergocalciferol (VITAMIN D2) 50000 units capsule Take 50,000 Units by mouth once a week. Take on Tuesdays   Yes [provider]  furosemide (LASIX) 20 MG tablet Take 1 tablet (20 mg total) by mouth daily. 02/29/16  Yes Jeralyn Bennett, MD  HYDROcodone-acetaminophen (NORCO) 7.5-325 MG per tablet  Take 1 tablet by mouth every 4 (four) hours.  02/27/12  Yes [provider]  nitroGLYCERIN (NITROSTAT) 0.4 MG SL tablet Place 0.4 mg under the tongue every 5 (five) minutes as needed. For chest pain   Yes [provider]  omeprazole (PRILOSEC) 40 MG capsule Take 40 mg by mouth daily.   Yes [provider]  PROAIR HFA 108 [id number] Base) MCG/ACT inhaler Inhale 1-2 puffs into the lungs every 6 (six) hours as needed for wheezing.  01/09/17  Yes [provider]  sertraline (ZOLOFT) 100 MG tablet Take 200 mg by mouth daily.     Yes [provider]  traZODone (DESYREL) 50 MG tablet Take 75 mg by mouth at bedtime.   Yes [provider]  albuterol (PROVENTIL) (5 MG/ML) 0.5% nebulizer solution Take 0.5 mLs (2.5 mg total) by nebulization 3 (three) times daily as needed for wheezing. Patient not taking: Reported on 01/14/2017 04/07/12 01/14/17  Kathlen Mody, MD  feeding supplement (BOOST / RESOURCE BREEZE) LIQD Take 1 Container by mouth 3 (three) times daily between meals. Patient not taking: Reported on 01/14/2017 02/29/16   Jeralyn Bennett, MD  ipratropium (ATROVENT) 0.02 % nebulizer solution Take 2.5 mLs (0.5 mg total) by nebulization 3 (three) times daily as needed for wheezing. Patient not taking: Reported on 01/14/2017 04/07/12 01/14/17  Kathlen Mody, MD  Multiple Vitamin (MULTIVITAMIN WITH MINERALS) TABS Take 1 tablet by mouth daily. Patient not taking: Reported on 01/14/2017 04/07/12   Kathlen Mody, MD    Physical Exam:    Constitutional: Frail cachectic elderly female who is lethargic. Vitals:   01/13/17 2226 01/14/17 0049 01/14/17 0130  BP: 111/77  (!) 104/58  Pulse: 77  78  Resp: 16  (!) 21  Temp: 98.5 F (36.9 C)    TempSrc: Oral    SpO2: 99% 92% (!) 88%  Weight: 31.8 kg (70 lb)     Eyes: PERRL, lids and conjunctivae normal ENMT: Mucous membranes are dry. Posterior pharynx clear of any exudate or lesions.   Neck: normal, supple, no masses, no thyromegaly Respiratory: Decreased overall aeration with decreased breath sounds in the bases of the lungs bilaterally. Cardiovascular: Regular rate and rhythm, no murmurs / rubs / gallops. No extremity edema. 2+ pedal pulses. No carotid bruits.  Abdomen: no tenderness, no masses palpated. No hepatosplenomegaly. Bowel sounds positive.  Musculoskeletal: no clubbing / cyanosis. Kyphosis present. Skin: no rashes, lesions, ulcers. No induration Neurologic: CN 2-12 grossly intact. Sensation intact, DTR normal. Strength 5/5 in all 4.  Psychiatric: Lethargic  unable to assess    Labs on Admission: I have personally reviewed following labs and imaging studies  CBC:  Recent Labs Lab 01/13/17 2238  WBC 10.8*  NEUTROABS 9.1*  HGB 9.9*  HCT 36.5  MCV 82.8  PLT 188   Basic Metabolic Panel:  Recent Labs Lab 01/13/17 2238  NA 138  K 4.4  CL 98*  CO2 26  GLUCOSE 141*  BUN 41*  CREATININE 1.56*  CALCIUM 8.8*   GFR: CrCl cannot be calculated (Unknown ideal weight.). Liver Function Tests:  Recent Labs Lab 01/13/17 2238  AST 35  ALT 16  ALKPHOS 37*  BILITOT 1.4*  PROT 4.9*  ALBUMIN 3.0*   No results for input(s): LIPASE, AMYLASE in the last 168 hours. No results for input(s): AMMONIA in the last 168 hours. Coagulation Profile:  Recent Labs Lab 01/14/17 0117  INR 1.26   Cardiac Enzymes: No results for input(s): CKTOTAL, CKMB, CKMBINDEX, TROPONINI in the  last 168 hours. BNP (last 3 results) No results for input(s): PROBNP in the last 8760 hours. HbA1C: No results for input(s): HGBA1C in the last 72 hours. CBG: No results for input(s): GLUCAP in the last 168 hours. Lipid Profile: No results for input(s): CHOL, HDL, LDLCALC, TRIG, CHOLHDL, LDLDIRECT in the last 72 hours. Thyroid Function Tests: No results for input(s): TSH, T4TOTAL, FREET4, T3FREE, THYROIDAB in the last 72 hours. Anemia Panel: No results for input(s): VITAMINB12, FOLATE, FERRITIN, TIBC, IRON, RETICCTPCT in the last 72 hours. Urine analysis:    Component Value Date/Time   COLORURINE AMBER (A) 01/14/2017 0247   APPEARANCEUR HAZY (A) 01/14/2017 0247   LABSPEC 1.023 01/14/2017 0247   PHURINE 5.0 01/14/2017 0247   GLUCOSEU NEGATIVE 01/14/2017 0247   HGBUR NEGATIVE 01/14/2017 0247   BILIRUBINUR NEGATIVE 01/14/2017 0247   KETONESUR NEGATIVE 01/14/2017 0247   PROTEINUR 30 (A) 01/14/2017 0247   UROBILINOGEN 1.0 03/25/2012 1527   NITRITE NEGATIVE 01/14/2017 0247   LEUKOCYTESUR NEGATIVE 01/14/2017 0247   Sepsis Labs: No results found for this or  any previous visit (from the past 240 hour(s)).   Radiological Exams on Admission: Dg Abd Acute W/chest  Result Date: 01/14/2017 CLINICAL DATA:  Abdominal pain, nausea, vomiting and diarrhea for several days. EXAM: DG ABDOMEN ACUTE W/ 1V CHEST COMPARISON:  Chest radiographs 02/28/2016 FINDINGS: Bilateral pleural effusions and bibasilar airspace disease, unchanged from prior exam. Cardiomediastinal contours are obscured. Pulmonary vasculature is stable. Patient is kyphotic. Generalized paucity of bowel gas. Minimal air within the sigmoid colon. No significant colonic stool is visualized. Chain sutures suspected in the left upper quadrant. Pelvic calcifications likely phleboliths. No evidence of free air. The bones are diffusely under mineralized. IMPRESSION: 1. Unchanged appearance of the chest with bilateral pleural effusions and bibasilar airspace disease. 2. Nonspecific bowel-gas pattern with generalized paucity of bowel gas. No evidence of free air. 3. Diffuse osteopenia/osteoporosis. Electronically Signed   By: Rubye OaksMelanie  Ehinger M.D.   On: 01/14/2017 02:34    EKG: Independently reviewed. Sinus rhythm similar to previous EKG  Assessment/Plan GI bleed with acute blood loss anemia: Acute. Patient is on hemoglobin noted to be around 11. Hemoglobin on presentation 9.9 with elevated BUN to suggest upper GI bleed. Imaging studies showed no acute signs of perforation. - Admit to telemetry bed - NPO - Continue Protonix drip - Typed and screened for possible need of blood products -  Monitor H&H's  - Transfuse PRBCs, if hemoglobin drops less than 8  - May warrant consultation to gastroenterology in a.m.   Acute kidney injury: Baseline creatinine previously noted to be within normal limits. Patient presents with a creatinine of 1.56 and BUN of 41. Elevated BUN to creatinine ratio suggests prerenal cause of symptoms. - IVF NS at 75 ml/hr - Recheck BMP  - Hold nephrotoxic agents  Leukocytosis:  WBC  elevated at 10.8. UA negative for any acute signs of infection and chest x-ray showing bilateral pleural effusions similar to previous.  Suspect secondary to possible GI bleed.   Chronic diastolic CHF: Last EF noted to be 65-70% with grade 1 dFx in 02/2016. Patient does not appear to be acutely fluid overloaded at this time. - Strict I&Os - Held furosemide  Chronic respiratory failure with hypoxia, COPD: chronic. Patient on 3 L of nasal cannula oxygen 24/7. Patient does not appear to be having acute exacerbation  - Continuous pulse oximetry with nasal cannula oxygen and keep O2 sat greater than 90% - Duoneb prn shortness of breath/wheezing  Bilateral pleural effusions: Chronic.  On previous chest x-rays.  DVT prophylaxis: SCDs Code Status: Full Family Communication: Discussed plan of care with the patient and family present at bedside Disposition Plan: TBD Consults called: None  Admission status: Inpatient   Clydie Braun MD [facility] Pager [phone]  If 7PM-7AM, please contact night-coverage [email or web address] Password [email or web address]  01/14/2017, 4:07 AM

## 2017-01-14 NOTE — Progress Notes (Addendum)
Paged MD Jomarie LongsJoseph to inform that pt had not voided on this shift. Bladder scan 439 ml.  Verbal order to do in & out cath per MD Jomarie LongsJoseph.  1845 Pt refused IN & out cath and decided to use BSC instead.   Leonia ReevesNatalie N Lurline Caver, RN

## 2017-01-14 NOTE — Progress Notes (Addendum)
Pt seen and examined 75/F with Chronic diastolic CHF, COPD on 3L Home O2, Hiatal hernia, s/p multiple surgeries about 8-10years ago with complications, admitted with Failure to thrive Ongoing weight loss, N/Abd pain and sometimes vomiting after meals, worsening weakness, debility, intermittent dark stools, ED workup with 2gram drop in Hb, could be more-she is dehydrated/hemoconcentrated now, heme positive stools, AKI -creatinine 1.5 from normal baseline -She is very cachectic and debilitated, weights 31Kg -will get GI Input whether EGD would be considered too high risk at this time, on PPI Q12 now, no active bleeding at this time -also spoke to sons regarding Palliative medicine consult, I will request this -continue IVF, SLP evaluation -PT/OT consult  Zannie CovePreetha Latoyia Tecson, MD

## 2017-01-14 NOTE — Progress Notes (Signed)
Paged MD Jomarie LongsJoseph of pain stating pain level 9/10 in left hip and pt stated Tylenol does not help.    Pt stated she takes Norco at home.  Awaiting orders  Leonia ReevesNatalie N Yarlin Breisch, RN

## 2017-01-14 NOTE — ED Provider Notes (Signed)
[facility]-EMERGENCY DEPT Provider Note   CSN: [id number] Arrival date & time: 01/13/17  2216  By signing my name below, I, Rosana Fret, attest that this documentation has been prepared under the direction and in the presence of Talyssa Gibas, Canary Brim, MD. Electronically Signed: Rosana Fret, ED Scribe. 01/14/17. 12:45 AM.  History   Chief Complaint Chief Complaint  Patient presents with  . Weakness   The history is provided by the patient and a relative.  Weakness  Associated symptoms include vomiting.   HPI Comments: Laurie Sparks is a 76 y.o. female with a PMHx of CAD and COPD, who presents to the Emergency Department complaining of constant, moderate generalized weakness that has been ongoing the past couple of weeks. Pt's family states she has not been eating recently. Pt reports associated trouble breathing, vomiting, melena, and visual disturbance.  Pt denies hematemesis or any other complaints at this time.  Past Medical History:  Diagnosis Date  . CHF (congestive heart failure) (HCC)   . COPD (chronic obstructive pulmonary disease) (HCC)   . Coronary artery disease   . Depression   . Insomnia   . Myocardial infarct (HCC)   . Reflux     Patient Active Problem List   Diagnosis Date Noted  . Acute diastolic CHF (congestive heart failure) (HCC) 02/29/2016  . Protein-calorie malnutrition, severe 02/27/2016  . Hypokalemia 02/23/2016  . Acute decompensated heart failure (HCC) 02/23/2016  . Dyspnea 02/23/2016  . Oxygen dependent 02/23/2016  . Polymicrobial bacterial infection 04/22/2012  . Bacteremia due to Enterococcus 04/22/2012  . E coli bacteremia 04/22/2012  . Cholangitis 04/22/2012  . Acute cholangitis 04/02/2012    Class: Acute  . Septic shock(785.52) 03/26/2012  . Acute respiratory failure with hypoxia (HCC) 03/26/2012  . Chest pain syndrome 03/25/2012  . CAD (coronary artery disease) 03/25/2012  . COPD (chronic obstructive pulmonary disease) (HCC) 03/25/2012    . Anxiety 03/25/2012  . CHF (congestive heart failure) (HCC) 03/25/2012  . Vitamin D deficiency disease 03/25/2012  . Suicidal ideation 03/25/2012    Class: Acute  . UTI (lower urinary tract infection) 03/25/2012  . Hypotension 03/25/2012  . Fever 03/25/2012    Past Surgical History:  Procedure Laterality Date  . ABDOMINAL HYSTERECTOMY    . ANKLE SURGERY    . CHOLECYSTECTOMY      OB History    No data available       Home Medications    Prior to Admission medications   Medication Sig Start Date End Date Taking? Authorizing Provider  aspirin EC 81 MG tablet Take 81 mg by mouth daily.   Yes [provider]  carvedilol (COREG) 12.5 MG tablet Take 12.5 mg by mouth 2 (two) times daily with a meal.   Yes [provider]  ergocalciferol (VITAMIN D2) 50000 units capsule Take 50,000 Units by mouth once a week. Take on Tuesdays   Yes [provider]  furosemide (LASIX) 20 MG tablet Take 1 tablet (20 mg total) by mouth daily. 02/29/16  Yes Jeralyn Bennett, MD  HYDROcodone-acetaminophen (NORCO) 7.5-325 MG per tablet Take 1 tablet by mouth every 4 (four) hours.  02/27/12  Yes [provider]  nitroGLYCERIN (NITROSTAT) 0.4 MG SL tablet Place 0.4 mg under the tongue every 5 (five) minutes as needed. For chest pain   Yes [provider]  omeprazole (PRILOSEC) 40 MG capsule Take 40 mg by mouth daily.   Yes [provider]  PROAIR HFA 108 (90 Base) MCG/ACT inhaler Inhale 1-2 puffs into  the lungs every 6 (six) hours as needed for wheezing.  01/09/17  Yes [provider]  sertraline (ZOLOFT) 100 MG tablet Take 200 mg by mouth daily.    Yes [provider]  traZODone (DESYREL) 50 MG tablet Take 75 mg by mouth at bedtime.   Yes [provider]  albuterol (PROVENTIL) (5 MG/ML) 0.5% nebulizer solution Take 0.5 mLs (2.5 mg total) by nebulization 3 (three) times daily as needed for wheezing. Patient not taking: Reported on  01/14/2017 04/07/12 01/14/17  Kathlen ModyAkula, Vijaya, MD  feeding supplement (BOOST / RESOURCE BREEZE) LIQD Take 1 Container by mouth 3 (three) times daily between meals. Patient not taking: Reported on 01/14/2017 02/29/16   Jeralyn BennettZamora, Ezequiel, MD  ipratropium (ATROVENT) 0.02 % nebulizer solution Take 2.5 mLs (0.5 mg total) by nebulization 3 (three) times daily as needed for wheezing. Patient not taking: Reported on 01/14/2017 04/07/12 01/14/17  Kathlen ModyAkula, Vijaya, MD  Multiple Vitamin (MULTIVITAMIN WITH MINERALS) TABS Take 1 tablet by mouth daily. Patient not taking: Reported on 01/14/2017 04/07/12   Kathlen ModyAkula, Vijaya, MD    Family History Family History  Problem Relation Age of Onset  . Heart failure Mother   . Diabetes Mother     Social History Social History  Substance Use Topics  . Smoking status: Never Smoker  . Smokeless tobacco: Never Used  . Alcohol use No     Allergies   Ambien [zolpidem tartrate]; Ativan [lorazepam]; Lactose intolerance (gi); and Other   Review of Systems Review of Systems  Constitutional: Positive for appetite change.  Eyes: Positive for visual disturbance.  Gastrointestinal: Positive for vomiting.  Neurological: Positive for weakness.  All other systems reviewed and are negative.    Physical Exam Updated Vital Signs BP (!) 104/58   Pulse 78   Temp 98.5 F (36.9 C) (Oral)   Resp (!) 21   Wt 31.8 kg (70 lb)   SpO2 (!) 88%   BMI 13.23 kg/m   Physical Exam  Constitutional: She is oriented to person, place, and time. She appears well-nourished. No distress.  Cachectic.   HENT:  Head: Normocephalic and atraumatic.  Right Ear: Hearing normal.  Left Ear: Hearing normal.  Nose: Nose normal.  Mouth/Throat: Oropharynx is clear and moist and mucous membranes are normal.  Eyes: Conjunctivae and EOM are normal. Pupils are equal, round, and reactive to light.  Neck: Normal range of motion. Neck supple.  Cardiovascular: Regular rhythm, S1 normal and S2 normal.  Exam reveals no  gallop and no friction rub.   No murmur heard. Pulmonary/Chest: Effort normal and breath sounds normal. No respiratory distress. She exhibits no tenderness.  Abdominal: Soft. Normal appearance and bowel sounds are normal. There is no hepatosplenomegaly. There is no tenderness. There is no rebound, no guarding, no tenderness at McBurney's point and negative Murphy's sign. No hernia.  Genitourinary:  Genitourinary Comments: Stool sample collected with chaperone present.   Musculoskeletal: Normal range of motion.  Sever kyphosis of the back.   Neurological: She is alert and oriented to person, place, and time. She has normal strength. No cranial nerve deficit or sensory deficit. Coordination normal. GCS eye subscore is 4. GCS verbal subscore is 5. GCS motor subscore is 6.  Skin: Skin is warm, dry and intact. No rash noted. No cyanosis.  Psychiatric: She has a normal mood and affect. Her speech is normal and behavior is normal. Thought content normal.  Nursing note and vitals reviewed.    ED Treatments / Results  DIAGNOSTIC STUDIES:  Oxygen Saturation is 99% on 3L/min via [place], normal by my interpretation.   COORDINATION OF CARE: 12:42 AM-Discussed next steps with pt including UA and possible GI bleed. Pt verbalized understanding and is agreeable with the plan.   Labs (all labs ordered are listed, but only abnormal results are displayed) Labs Reviewed  COMPREHENSIVE METABOLIC PANEL - Abnormal; Notable for the following:       Result Value   Chloride 98 (*)    Glucose, Bld 141 (*)    BUN 41 (*)    Creatinine, Ser 1.56 (*)    Calcium 8.8 (*)    Total Protein 4.9 (*)    Albumin 3.0 (*)    Alkaline Phosphatase 37 (*)    Total Bilirubin 1.4 (*)    GFR calc non Af Amer 31 (*)    GFR calc Af Amer 36 (*)    All other components within normal limits  CBC WITH DIFFERENTIAL/PLATELET - Abnormal; Notable for the following:    WBC 10.8 (*)    Hemoglobin 9.9 (*)    MCH 22.4 (*)    MCHC 27.1  (*)    RDW 17.6 (*)    Neutro Abs 9.1 (*)    All other components within normal limits  PROTIME-INR - Abnormal; Notable for the following:    Prothrombin Time 15.9 (*)    All other components within normal limits  POC OCCULT BLOOD, ED - Abnormal; Notable for the following:    Fecal Occult Bld POSITIVE (*)    All other components within normal limits  URINALYSIS, ROUTINE W REFLEX MICROSCOPIC  TYPE AND SCREEN    EKG  EKG Interpretation  Date/Time:  Tuesday January 14 2017 01:07:41 EDT Ventricular Rate:  79 PR Interval:    QRS Duration: 95 QT Interval:  417 QTC Calculation: 478 R Axis:   3 Text Interpretation:  Sinus rhythm Minimal ST depression, anterolateral leads No significant change since last tracing Confirmed by Gilda Crease [phone]) on 01/14/2017 1:50:47 AM       Radiology Dg Abd Acute W/chest  Result Date: 01/14/2017 CLINICAL DATA:  Abdominal pain, nausea, vomiting and diarrhea for several days. EXAM: DG ABDOMEN ACUTE W/ 1V CHEST COMPARISON:  Chest radiographs 02/28/2016 FINDINGS: Bilateral pleural effusions and bibasilar airspace disease, unchanged from prior exam. Cardiomediastinal contours are obscured. Pulmonary vasculature is stable. Patient is kyphotic. Generalized paucity of bowel gas. Minimal air within the sigmoid colon. No significant colonic stool is visualized. Chain sutures suspected in the left upper quadrant. Pelvic calcifications likely phleboliths. No evidence of free air. The bones are diffusely under mineralized. IMPRESSION: 1. Unchanged appearance of the chest with bilateral pleural effusions and bibasilar airspace disease. 2. Nonspecific bowel-gas pattern with generalized paucity of bowel gas. No evidence of free air. 3. Diffuse osteopenia/osteoporosis. Electronically Signed   By: Rubye Oaks M.D.   On: 01/14/2017 02:34    Procedures Procedures (including critical care time)  Medications Ordered in ED Medications  pantoprazole (PROTONIX) 80  mg in sodium chloride 0.9 % 250 mL (0.32 mg/mL) infusion (8 mg/hr Intravenous New Bag/Given 01/14/17 0142)  pantoprazole (PROTONIX) injection 40 mg (not administered)  sodium chloride 0.9 % bolus 500 mL (0 mLs Intravenous Stopped 01/14/17 0148)  pantoprazole (PROTONIX) 80 mg in sodium chloride 0.9 % 100 mL IVPB (0 mg Intravenous Stopped 01/14/17 0214)     Initial Impression / Assessment and Plan / ED Course  I have reviewed the triage vital signs and the nursing notes.  Pertinent labs &  imaging results that were available during my care of the patient were reviewed by me and considered in my medical decision making (see chart for details).     Patient brought to the ER from home with concerns of generalized weakness. Family reports that the patient has not been eating or drinking recently, has progressively become more weak. There have been dark stools at home. She has a history of hiatal hernia, no known history of GI bleeding or peptic ulcer disease. Rectal exam revealed brown stool that was heme positive. Patient's hemoglobin is 9.9 down from her baseline of 11's. She has a significantly elevated BUN but also an elevated creatinine. This is likely a mixed picture of dehydration and upper GI bleed. Abdominal exam revealed mild epigastric tenderness, no concern for acute surgical process. X-ray shows nonspecific bowel gas pattern, no obstruction. There are bilateral pleural effusions. Patient will require hospitalization for further management of generalized weakness with dehydration and acute kidney injury, likely upper GI bleed.  Final Clinical Impressions(s) / ED Diagnoses   Final diagnoses:  Dehydration  AKI (acute kidney injury) (HCC)  Upper GI bleed    New Prescriptions New Prescriptions   No medications on file   I personally performed the services described in this documentation, which was scribed in my presence. The recorded information has been reviewed and is accurate.       Gilda Crease, MD 01/14/17 [phone]

## 2017-01-14 NOTE — Evaluation (Signed)
Physical Therapy Evaluation Patient Details Name: Laurie Sparks MRN: [id number] DOB: [birth year] Today's Date: 01/14/2017   History of Present Illness  Patient is a 76 yo female admitted 01/14/17 with increased weakness and decreased appetite.  Patient with GIB and anemia.    PMH:  CHF, COPD, on 3L O2 at home, CAD, depression, MI, chronic pain  Clinical Impression  Patient presents with problems listed below.  Patient with significant weakness and decreased activity tolerance.  Patient reports that son transfers her into and out of her w/c, but she primarily remains in bed.  Will provide 2-week trial period of PT to assess potential for progress.   Palliative Care consult pending.  Will modify PT plan as needed based on outcome of meeting/goals of care. If son unable to care for patient, may need to consider SNF at d/c.    Follow Up Recommendations Home health PT;Supervision/Assistance - 24 hour    Equipment Recommendations  Hospital bed    Recommendations for Other Services       Precautions / Restrictions Precautions Precautions: Fall Restrictions Weight Bearing Restrictions: No      Mobility  Bed Mobility Overal bed mobility: Needs Assistance Bed Mobility: Rolling;Sidelying to Sit;Sit to Supine Rolling: Min guard Sidelying to sit: Min guard;HOB elevated   Sit to supine: Mod assist;HOB elevated   General bed mobility comments: Verbal cues for technique.  Patient able to move to sitting position with HOB elevated.  Required increased time with rest breaks during transition.  Patient able to sit EOB x8 min with min guard assist for safety.  Patient with very flexed posture in sitting with head down and forward.  Assist to control trunk descent and bring LE's onto bed.  Transfers                 General transfer comment: Patient declined.  Ambulation/Gait                Stairs            Wheelchair Mobility    Modified Rankin (Stroke Patients Only)        Balance Overall balance assessment: Needs assistance Sitting-balance support: Single extremity supported;Feet supported Sitting balance-Leahy Scale: Poor                                       Pertinent Vitals/Pain Pain Assessment: 0-10 Pain Score: 4  Pain Location: Generalized Pain Descriptors / Indicators: Aching Pain Intervention(s): Limited activity within patient's tolerance;Monitored during session;Repositioned    Home Living Family/patient expects to be discharged to:: Private residence Living Arrangements: Children Available Help at Discharge: Family;Available 24 hours/day Type of Home: House Home Access: Stairs to enter Entrance Stairs-Rails: None Entrance Stairs-Number of Steps: 4 Home Layout: One level (Split level with 1 step between several levels of house) Home Equipment: Cane - single point;Wheelchair - manual Additional Comments: Patient able to stay in one area of house - to avoid stairs.  Patient reports she mostly stays in bed now.    Prior Function Level of Independence: Needs assistance   Gait / Transfers Assistance Needed: Per patient, son puts her in w/c for mobility  ADL's / Homemaking Assistance Needed: Son assists with meal prep and housekeeping.  He provides supervision for patient to sponge bathe.        Hand Dominance   Dominant Hand: Right    Extremity/Trunk Assessment   Upper Extremity  Assessment Upper Extremity Assessment: Generalized weakness    Lower Extremity Assessment Lower Extremity Assessment: RLE deficits/detail;LLE deficits/detail RLE Deficits / Details: Strength grossly 3-/5. LLE Deficits / Details: Strength grossly 3-/5    Cervical / Trunk Assessment Cervical / Trunk Assessment: Kyphotic  Communication   Communication: No difficulties  Cognition Arousal/Alertness: Awake/alert Behavior During Therapy: Flat affect Overall Cognitive Status: No family/caregiver present to determine baseline  cognitive functioning                                        General Comments      Exercises     Assessment/Plan    PT Assessment Patient needs continued PT services  PT Problem List Decreased strength;Decreased activity tolerance;Decreased balance;Decreased mobility;Decreased coordination;Decreased knowledge of use of DME;Cardiopulmonary status limiting activity;Pain       PT Treatment Interventions Functional mobility training;Therapeutic activities;Therapeutic exercise;Balance training;Patient/family education    PT Goals (Current goals can be found in the Care Plan section)  Acute Rehab PT Goals Patient Stated Goal: To breathe better PT Goal Formulation: With patient Time For Goal Achievement: 01/28/17 Potential to Achieve Goals: Fair    Frequency Min 2X/week   Barriers to discharge        Co-evaluation               AM-PAC PT "6 Clicks" Daily Activity  Outcome Measure Difficulty turning over in bed (including adjusting bedclothes, sheets and blankets)?: A Little Difficulty moving from lying on back to sitting on the side of the bed? : Total Difficulty sitting down on and standing up from a chair with arms (e.g., wheelchair, bedside commode, etc,.)?: Total Help needed moving to and from a bed to chair (including a wheelchair)?: A Lot Help needed walking in hospital room?: A Lot Help needed climbing 3-5 steps with a railing? : Total 6 Click Score: 10    End of Session Equipment Utilized During Treatment: Oxygen Activity Tolerance: Patient limited by fatigue;Patient limited by pain Patient left: in bed;with call bell/phone within reach   PT Visit Diagnosis: Difficulty in walking, not elsewhere classified (R26.2);Muscle weakness (generalized) (M62.81);Adult, failure to thrive (R62.7)    Time: 1610-9604 PT Time Calculation (min) (ACUTE ONLY): 25 min   Charges:   PT Evaluation $PT Eval Moderate Complexity: 1 Procedure PT  Treatments $Therapeutic Activity: 8-22 mins   PT G Codes:        Durenda Hurt. Renaldo Fiddler, [facility] Acute Rehab Services Pager [phone]   Vena Austria 01/14/2017, 7:42 PM

## 2017-01-14 NOTE — Evaluation (Signed)
Clinical/Bedside Swallow Evaluation Patient Details  Name: Laurie HivesJoyce Cirillo MRN: [id number] Date of Birth: 05/19/1941  Today's Date: 01/14/2017 Time: SLP Start Time (ACUTE ONLY): 1345 SLP Stop Time (ACUTE ONLY): 1358 SLP Time Calculation (min) (ACUTE ONLY): 13 min  Past Medical History:  Past Medical History:  Diagnosis Date  . AKI (acute kidney injury) (HCC) 01/2017  . CHF (congestive heart failure) (HCC)   . COPD (chronic obstructive pulmonary disease) (HCC)   . Coronary artery disease   . Depression   . Dyspnea   . Insomnia   . Myocardial infarct (HCC)   . Reflux    Past Surgical History:  Past Surgical History:  Procedure Laterality Date  . ABDOMINAL HYSTERECTOMY    . ANKLE SURGERY    . CHOLECYSTECTOMY    . HIATAL HERNIA REPAIR     HPI:  76 y.o.femalewith medical history significant for advanced cachexia and malnutrition, diastolic CHF, COPD, oxygen dependent on 3L nasal oxygen, CAD, and GERD, large HH was brought in by her sons for not eating and drinking.  At baseline the patient is noted to be able to transition to a bedside commode, but is mostly sedentary throughout the day. For the last few weeks, the patient has been having worsening generalized weakness. In the last week, she hasonly taken a few sips or bites of any food or drink given to her and is pushing the rest back. Associated symptoms include increased lethargy, unknown amount of weight loss, and dark loose stools. Family notes a chronic history of vomiting after eating meals that is unchanged.  Dx FTT, GI bleed with acute blood loss anemia, AKI, leukocytosis.    Assessment / Plan / Recommendation Clinical Impression  Pt weak, poor initiation.  States she usually eats meals in bed, reclined to right side.  Presented with adequate mastication; brisk swallow response; no overt s/s of aspiration.  Adequate coordination of respiratory/swallowing cycle.  Pt consumed only limited POs during assessment, but did drink 3 oz  of water without difficulty.  No oropharyngeal dysphagia identified - recommend diet advanced per GI.  Our services will sign off.  SLP Visit Diagnosis: Dysphagia, unspecified (R13.10)    Aspiration Risk  No limitations    Diet Recommendation   clear liquids per GI and advance per their recommendation  Medication Administration: Whole meds with liquid    Other  Recommendations Oral Care Recommendations: Oral care BID   Follow up Recommendations        Frequency and Duration            Prognosis        Swallow Study   General Date of Onset: 01/13/17 HPI: 76 y.o.femalewith medical history significant for diastolic CHF, COPD, oxygen dependent on 3L nasal oxygen, CAD, and GERD, large HH was brought in by her sons for not eating and drinking.  At baseline the patient is noted to be able to transition to a bedside commode, but is mostly sedentary throughout the day. For the last few weeks, the patient has been having worsening generalized weakness. In the last week, she hasonly taken a few sips or bites of any food or drink given to her and is pushing the rest back. Associated symptoms include increased lethargy, unknown amount of weight loss, and dark loose stools. Family notes a chronic history of vomiting after eating meals that is unchanged.  Dx FTT, GI bleed with acute blood loss anemia, AKI, leukocytosis.  Type of Study: Bedside Swallow Evaluation Previous Swallow Assessment: not per  SLP - has been followed by GI Diet Prior to this Study: Other (Comment) (clear liquid diet) Temperature Spikes Noted: No Respiratory Status: Nasal cannula History of Recent Intubation: No Behavior/Cognition: Alert Oral Cavity Assessment: Within Functional Limits Oral Care Completed by SLP: No Oral Cavity - Dentition: Adequate natural dentition Vision: Functional for self-feeding Self-Feeding Abilities: Able to feed self Patient Positioning: Partially reclined Baseline Vocal Quality: Low vocal  intensity Volitional Cough: Weak Volitional Swallow: Able to elicit    Oral/Motor/Sensory Function Overall Oral Motor/Sensory Function: Within functional limits   Ice Chips Ice chips: Within functional limits   Thin Liquid Thin Liquid: Within functional limits    Nectar Thick Nectar Thick Liquid: Not tested   Honey Thick Honey Thick Liquid: Not tested   Puree Puree: Within functional limits Presentation: Spoon   Solid   GO   Solid: Not tested        Blenda Mounts Laurice 01/14/2017,2:02 PM

## 2017-01-15 ENCOUNTER — Inpatient Hospital Stay (HOSPITAL_COMMUNITY): Payer: Medicare Other | Admitting: Certified Registered"

## 2017-01-15 ENCOUNTER — Encounter (HOSPITAL_COMMUNITY): Payer: Self-pay

## 2017-01-15 ENCOUNTER — Encounter (HOSPITAL_COMMUNITY)
Admission: EM | Disposition: A | Discharge status: Hospice | Payer: Self-pay | Source: Home / Self Care | Attending: Internal Medicine

## 2017-01-15 ENCOUNTER — Inpatient Hospital Stay (HOSPITAL_COMMUNITY): Payer: Medicare Other

## 2017-01-15 DIAGNOSIS — D500 Iron deficiency anemia secondary to blood loss (chronic): Secondary | ICD-10-CM

## 2017-01-15 DIAGNOSIS — L304 Erythema intertrigo: Secondary | ICD-10-CM

## 2017-01-15 DIAGNOSIS — R634 Abnormal weight loss: Secondary | ICD-10-CM

## 2017-01-15 DIAGNOSIS — Z7189 Other specified counseling: Secondary | ICD-10-CM

## 2017-01-15 DIAGNOSIS — K2210 Ulcer of esophagus without bleeding: Secondary | ICD-10-CM

## 2017-01-15 DIAGNOSIS — Z515 Encounter for palliative care: Secondary | ICD-10-CM

## 2017-01-15 HISTORY — PX: ESOPHAGOGASTRODUODENOSCOPY: SHX5428

## 2017-01-15 LAB — COMPREHENSIVE METABOLIC PANEL
ALBUMIN: 3 g/dL — AB (ref 3.5–5.0)
ALK PHOS: 38 U/L (ref 38–126)
ALT: 15 U/L (ref 14–54)
ANION GAP: 10 (ref 5–15)
AST: 32 U/L (ref 15–41)
BILIRUBIN TOTAL: 0.9 mg/dL (ref 0.3–1.2)
BUN: 35 mg/dL — AB (ref 6–20)
CALCIUM: 8.3 mg/dL — AB (ref 8.9–10.3)
CO2: 26 mmol/L (ref 22–32)
CREATININE: 0.99 mg/dL (ref 0.44–1.00)
Chloride: 105 mmol/L (ref 101–111)
GFR calc Af Amer: >60 mL/min (ref >60)
GFR calc non Af Amer: 54 mL/min — ABNORMAL LOW (ref >60)
GLUCOSE: 107 mg/dL — AB (ref 65–99)
Potassium: 3.7 mmol/L (ref 3.5–5.1)
SODIUM: 141 mmol/L (ref 135–145)
TOTAL PROTEIN: 5.1 g/dL — AB (ref 6.5–8.1)

## 2017-01-15 LAB — CBC
HCT: 34 % — ABNORMAL LOW (ref 36.0–46.0)
Hemoglobin: 9 g/dL — ABNORMAL LOW (ref 12.0–15.0)
MCH: 22.7 pg — ABNORMAL LOW (ref 26.0–34.0)
MCHC: 26.5 g/dL — AB (ref 30.0–36.0)
MCV: 85.6 fL (ref 78.0–100.0)
Platelets: 166 10*3/uL (ref 150–400)
RBC: 3.97 MIL/uL (ref 3.87–5.11)
RDW: 18 % — ABNORMAL HIGH (ref 11.5–15.5)
WBC: 10.8 10*3/uL — ABNORMAL HIGH (ref 4.0–10.5)

## 2017-01-15 LAB — T4, FREE: FREE T4: 0.61 ng/dL (ref 0.61–1.12)

## 2017-01-15 LAB — AMMONIA: Ammonia: 26 umol/L (ref 9–35)

## 2017-01-15 LAB — TSH: TSH: 1.4 u[IU]/mL (ref 0.350–4.500)

## 2017-01-15 SURGERY — EGD (ESOPHAGOGASTRODUODENOSCOPY)
Anesthesia: Monitor Anesthesia Care

## 2017-01-15 MED ORDER — IOPAMIDOL (ISOVUE-300) INJECTION 61%
INTRAVENOUS | Status: AC
  Filled 2017-01-15: qty 30

## 2017-01-15 MED ORDER — TRAMADOL HCL 50 MG PO TABS
50.0000 mg | ORAL_TABLET | Freq: Four times a day (QID) | ORAL | Status: DC | PRN
  Administered 2017-01-15 – 2017-01-19 (×8): 50 mg via ORAL
  Filled 2017-01-15 (×8): qty 1

## 2017-01-15 MED ORDER — PANTOPRAZOLE SODIUM 40 MG IV SOLR
40.0000 mg | Freq: Two times a day (BID) | INTRAVENOUS | Status: DC
  Administered 2017-01-15 – 2017-01-16 (×3): 40 mg via INTRAVENOUS
  Filled 2017-01-15 (×3): qty 40

## 2017-01-15 MED ORDER — CYANOCOBALAMIN 1000 MCG/ML IJ SOLN
1000.0000 ug | Freq: Every day | INTRAMUSCULAR | Status: AC
  Administered 2017-01-15 – 2017-01-17 (×3): 1000 ug via SUBCUTANEOUS
  Filled 2017-01-15 (×3): qty 1

## 2017-01-15 MED ORDER — SUCRALFATE 1 GM/10ML PO SUSP
1.0000 g | Freq: Three times a day (TID) | ORAL | Status: DC
  Administered 2017-01-15 – 2017-01-19 (×15): 1 g via ORAL
  Filled 2017-01-15 (×18): qty 10

## 2017-01-15 MED ORDER — IOPAMIDOL (ISOVUE-300) INJECTION 61%
INTRAVENOUS | Status: AC
  Administered 2017-01-15: 30 mL
  Filled 2017-01-15: qty 30

## 2017-01-15 MED ORDER — NYSTATIN 100000 UNIT/GM EX POWD
Freq: Every morning | CUTANEOUS | Status: DC
  Administered 2017-01-15 – 2017-01-20 (×6): via TOPICAL
  Filled 2017-01-15 (×2): qty 15

## 2017-01-15 MED ORDER — SODIUM CHLORIDE 0.9 % IV SOLN
INTRAVENOUS | Status: DC
Start: 2017-01-15 — End: 2017-01-15
  Administered 2017-01-15 (×2): via INTRAVENOUS

## 2017-01-15 MED ORDER — PROPOFOL 500 MG/50ML IV EMUL
INTRAVENOUS | Status: DC | PRN
  Administered 2017-01-15: 80 ug/kg/min via INTRAVENOUS

## 2017-01-15 MED ORDER — SODIUM CHLORIDE 0.9 % IV SOLN: INTRAVENOUS | Status: DC

## 2017-01-15 MED ORDER — THIAMINE HCL 100 MG/ML IJ SOLN
100.0000 mg | Freq: Every day | INTRAMUSCULAR | Status: DC
  Administered 2017-01-15 – 2017-01-16 (×2): 100 mg via INTRAVENOUS
  Filled 2017-01-15 (×2): qty 2

## 2017-01-15 NOTE — Consult Note (Signed)
WOC Nurse wound consult note Reason for Consult: skin fold redness Wound type: intertriginous skin damage Pressure Injury POA:No Wound bed: non blanchable redness, minimal, under the right breast Drainage (amount, consistency, odor) none Periwound: intact  Dressing procedure/placement/frequency: Light use of antifungal powder to affected area daily.   Discussed POC with patient and bedside nurse.  Re consult if needed, will not follow at this time. Thanks  Brynlea Spindler M.D.C[employer] MSN, RN,CWOCN, CNS [phone])

## 2017-01-15 NOTE — Interval H&P Note (Signed)
History and Physical Interval Note:  01/15/2017 1:10 PM  Laurie HivesJoyce Knoll  has presented today for surgery, with the diagnosis of abdominal pain, anemia, FOBT +  The various methods of treatment have been discussed with the patient and family. After consideration of risks, benefits and other options for treatment, the patient has consented to  Procedure(s): ESOPHAGOGASTRODUODENOSCOPY (EGD) (N/A) as a surgical intervention .  The patient's history has been reviewed, patient examined, no change in status, stable for surgery.  I have reviewed the patient's chart and labs.  Questions were answered to the patient's satisfaction.     Kavitha Nandigam

## 2017-01-15 NOTE — Progress Notes (Signed)
[facility] Progress Note  Patient: Laurie Sparks ZOX:[id number]   PCP: Halina Maidens., MD (Inactive) DOB: 1941-07-06   DOA: 01/14/2017   DOS: 01/15/2017   Date of Service: the patient was seen and examined on 01/15/2017  Subjective: Patient appears confused and therefore review of system is limited. She answers all the questions with "pretty much so". Per her son at her baseline patient has not shown any signs of early dementia or confusion. She walks with support of furniture and a cane. No vomiting reported overnight, no diarrhea reported. Patient is more lethargic this morning.  Brief hospital course: Pt. with PMH of severe COPD, chronic respiratory failure on 3 L of oxygen, COPD, chronic diastolic CHF, GERD, hiatal hernia; admitted on 01/14/2017, presented with complaint of poor oral intake, was found to have acute kidney injury, symptomatic anemia. Currently further plan is GI workup and goals of care discussion.  Assessment and Plan: 1. Failure to thrive. Protein calorie malnutrition, severe. 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 Patient primarily presented with poor by mouth intake as well as lethargy. Appears persistently more lethargic. By mouth intake still limited although the patient is nothing by mouth for EGD. Dietary consulted, speech therapy consulted recommended to advance the diet as tolerated. While her son who is at bedside mentions that this has been fairly recent that she has not been eating anything for last 2 weeks it appears by the degree of malnutrition that this is a long-standing issue likely contributed from her chronic nausea and vomiting as well as large hiatal hernia and COPD. Palliative care consulted for further discussion on goals of care.  2. Acute kidney injury. Based on renal function and serum creatinine running around 0.5. Presented with serum creatinine of 1.56 and BUN of 41. Currently 0.99 and 35. This appears significant improvement with IV hydration. Lactic  contributing to patient's lethargy as well as encephalopathy. We'll monitor. Gentle IV hydration will continue.  3. Acute encephalopathy. Per son the patient is acutely confused and at her baseline is fairly active. I suspect the patient may have early signs of dementia which the family has not identified at home and currently appears to have delirium from acute kidney injury as well as poor by mouth intake. I will start the patient on multivitamins, B-12 subcutaneous, thiamine IV 1 followed by oral. Check TSH and free T4. Check ammonia.  4. COPD. Chronic respiratory failure. Chronic Bilateral pleural effusion. Patient has severe COPD at her baseline no evidence of acute exacerbation. Continue when necessary DuoNeb's and had scheduled dulera. Also has bilateral pleural effusion unchanged from an x-ray done a year ago. We'll require further workup with a CT chest. We will await results of her EGD prior to that.  5. Suspected GI bleed. Suspected acute blood loss anemia, more likely chronic anemia from nutritional deficiency. Patient presents with fatigue and lethargy. On admission her hemoglobin was 9.9, which is a significant drop from her baseline. No evidence of active bleeding reported per family. No evidence of active bleeding here in the hospital as well. GI was consulted, patient was started on IV Protonix drip. At present I would discontinue the IV Protonix drip and place the patient on every 12 hours Protonix. EGD scheduled for today with anesthesia monitoring as this will be a high risk EGD given her respiratory status with COPD. We'll monitor the results of the EGD  Diet: Currently nothing by mouth for EGD, clear liquid diet following that with aspiration precaution. DVT Prophylaxis: mechanical compression device  Advance goals of care discussion: Discussed with patient's son who was at bedside, recommended to continue current care and full code  Family Communication:  family was present at bedside, at the time of interview. The pt provided permission to discuss medical plan with the family. Opportunity was given to ask question and all questions were answered satisfactorily.   Disposition:  Discharge to be determine.  Consultants: Gastroenterology, palliative care Procedures: EGD  Antibiotics: Anti-infectives    None       Objective: Physical Exam: Vitals:   01/14/17 1730 01/14/17 2056 01/15/17 0546 01/15/17 0854  BP: (!) 116/55 (!) 113/50 132/68   Pulse: 86 85 99   Resp: 15 12 11    Temp: 97.4 F (36.3 C) 98.7 F (37.1 C) 98.3 F (36.8 C)   TempSrc: Oral     SpO2: 100% 96% 90% (!) 66%  Weight:      Height:        Intake/Output Summary (Last 24 hours) at 01/15/17 1031 Last data filed at 01/15/17 0750  Gross per 24 hour  Intake          3028.75 ml  Output              200 ml  Net          2828.75 ml   Filed Weights   01/13/17 2226 01/14/17 1700  Weight: 31.8 kg (70 lb) 34 kg (74 lb 14.4 oz)   General: Alert, Awake and Oriented to Time, Place and Person. Appear in moderate distress, affect flat Eyes: PERRL, Conjunctiva normal ENT: Oral Mucosa clear moist. Neck: difficult to assess JVD, bi Abnormal Mass Or lumps Cardiovascular: S1 and S2 Present, no Murmur, Peripheral Pulses Present Respiratory: increased respiratory effort, Bilateral Air entry equal and Decreased, no use of accessory muscle, basal Crackles, no wheezes Abdomen: Bowel Sound present, Soft and no tenderness,  Skin: no redness, no Rash, no induration Extremities: no Pedal edema, no calf tenderness Neurologic: Grossly no focal neuro deficit. Bilaterally Equal motor strength  Data Reviewed: CBC:  Recent Labs Lab 01/13/17 2238 01/14/17 0538 01/14/17 1554 01/15/17 0635  WBC 10.8* 11.3*  --  10.8*  NEUTROABS 9.1*  --   --   --   HGB 9.9* 9.7* 8.7* 9.0*  HCT 36.5 35.6* 32.2* 34.0*  MCV 82.8 82.8  --  85.6  PLT 188 155  --  166   Basic Metabolic  Panel:  Recent Labs Lab 01/13/17 2238 01/14/17 0538 01/15/17 0635  NA 138 139 141  K 4.4 4.3 3.7  CL 98* 98* 105  CO2 26 25 26   GLUCOSE 141* 107* 107*  BUN 41* 42* 35*  CREATININE 1.56* 1.44* 0.99  CALCIUM 8.8* 8.7* 8.3*    Liver Function Tests:  Recent Labs Lab 01/13/17 2238 01/15/17 0635  AST 35 32  ALT 16 15  ALKPHOS 37* 38  BILITOT 1.4* 0.9  PROT 4.9* 5.1*  ALBUMIN 3.0* 3.0*   No results for input(s): LIPASE, AMYLASE in the last 168 hours. No results for input(s): AMMONIA in the last 168 hours. Coagulation Profile:  Recent Labs Lab 01/14/17 0117  INR 1.26   Studies: No results found.  Scheduled Meds: . feeding supplement  1 Container Oral TID BM  . multivitamin with minerals  1 tablet Oral Daily  . pantoprazole  40 mg Intravenous Q12H   Continuous Infusions: PRN Meds: acetaminophen **OR** acetaminophen, ipratropium-albuterol, ondansetron **OR** ondansetron (ZOFRAN) IV, traMADol  Time spent: 45 minutes  Author: Lynden OxfordPranav Ollin Hochmuth, MD  [facility] Pager: [phone] 01/15/2017 10:31 AM  If 7PM-7AM, please contact night-coverage at [email or web address], password [facility]

## 2017-01-15 NOTE — Progress Notes (Signed)
OT Cancellation Note  Patient Details Name: Audelia HivesJoyce Coyle MRN: [id number] DOB: 1941/07/29   Cancelled Treatment:    Reason Eval/Treat Not Completed: Patient at procedure or test/ unavailable.  Gaye AlkenBailey A Mickle Campton M.S., OTR/L Pager: [phone]  01/15/2017, 1:25 PM

## 2017-01-15 NOTE — Anesthesia Procedure Notes (Signed)
Procedure Name: MAC Date/Time: 01/15/2017 1:20 PM Performed by: Teressa Lower Pre-anesthesia Checklist: Patient identified, Emergency Drugs available, Suction available, Patient being monitored and Timeout performed Patient Re-evaluated:Patient Re-evaluated prior to inductionOxygen Delivery Method: Nasal cannula

## 2017-01-15 NOTE — Anesthesia Preprocedure Evaluation (Addendum)
Anesthesia Evaluation  Patient identified by MRN, date of birth, ID band Patient awake    Reviewed: Allergy & Precautions, NPO status , Patient's Chart, lab work & pertinent test results  Airway Mallampati: II       Dental  (+) Edentulous Upper, Edentulous Lower   Pulmonary     + decreased breath sounds      Cardiovascular  Rhythm:Regular Rate:Normal     Neuro/Psych    GI/Hepatic   Endo/Other    Renal/GU      Musculoskeletal   Abdominal   Peds  Hematology   Anesthesia Other Findings   Reproductive/Obstetrics                            Anesthesia Physical Anesthesia Plan  ASA: III  Anesthesia Plan: MAC   Post-op Pain Management:    Induction: Intravenous  PONV Risk Score and Plan:   Airway Management Planned: Natural Airway and Nasal Cannula  Additional Equipment:   Intra-op Plan:   Post-operative Plan:   Informed Consent: I have reviewed the patients History and Physical, chart, labs and discussed the procedure including the risks, benefits and alternatives for the proposed anesthesia with the patient or authorized representative who has indicated his/her understanding and acceptance.     Plan Discussed with:   Anesthesia Plan Comments:         Anesthesia Quick Evaluation

## 2017-01-15 NOTE — Progress Notes (Signed)
Patient drink 1.5 liters of contrast.

## 2017-01-15 NOTE — Progress Notes (Signed)
Off unit for CT scan. Left unit via bed, son at bedside.

## 2017-01-15 NOTE — Op Note (Signed)
[facility] Patient Name: Laurie Sparks Procedure Date : 01/15/2017 MRN: [id number] Attending MD: Napoleon Form , MD Date of Birth: 1940-12-07 CSN: [id number] Age: 76 Admit Type: Inpatient Procedure:                Upper GI endoscopy Indications:              Iron deficiency anemia due to suspected upper                            gastrointestinal bleeding, Dysphagia, Persistent                            vomiting of unknown cause Providers:                Napoleon Form, MD, Carin Hock, RN, Zoila Shutter, Technician Referring MD:              Medicines:                Monitored Anesthesia Care Complications:            No immediate complications. Estimated Blood Loss:     Estimated blood loss was minimal. Procedure:                Pre-Anesthesia Assessment:                           - Prior to the procedure, a History and Physical                            was performed, and patient medications and                            allergies were reviewed. The patient's tolerance of                            previous anesthesia was also reviewed. The risks                            and benefits of the procedure and the sedation                            options and risks were discussed with the patient.                            All questions were answered, and informed consent                            was obtained. Prior Anticoagulants: The patient has                            taken no previous anticoagulant or antiplatelet  agents. ASA Grade Assessment: II - A patient with                            mild systemic disease. After reviewing the risks                            and benefits, the patient was deemed in                            satisfactory condition to undergo the procedure.                           After obtaining informed consent, the endoscope was                            passed  under direct vision. Throughout the                            procedure, the patient's blood pressure, pulse, and                            oxygen saturations were monitored continuously. The                            [id number]-[id number] ([id number] scope was introduced through the                            mouth, and advanced to the second part of duodenum.                            The upper GI endoscopy was accomplished without                            difficulty. The patient tolerated the procedure                            well. Scope In: Scope Out: Findings:      The esophagus and gastroesophageal junction were examined with white       light and the Pentax i-Scan system. There were esophageal mucosal       changes suspicious for Barrett's esophagus. These changes involved the       mucosa at the upper extent of the gastric folds (32 cm from the       incisors) extending to the Z-line (20 cm from the incisors). Ulcerations       with erosive esophagitis were present from 20 to 32 cm. The maximum       longitudinal extent of these esophageal mucosal changes was 12 cm in       length. Unable to dileante the extent of possible barett's esophagus due       to erosive esophagitis      Evidence of a gastroduodenostomy was found in the anastomosis. This was       characterized by an intact appearance.      The examined small bowel, duodenum was normal. Impression:               -  Moderately severe erosive esophagitis. Biopsied.                           - A gastrodudenostomy was found, characterized by                            an intact appearance.                           - Normal examined Duodenum Moderate Sedation:      N/A Recommendation:           - Patient has a contact number available for                            emergencies. The signs and symptoms of potential                            delayed complications were discussed with the                            patient. Return to  normal activities tomorrow.                            Written discharge instructions were provided to the                            patient.                           - Clear liquid diet.                           - Continue present medications.                           - Protonix 40mg  BID                           - Carafate suspension 1gm before meals and at                            bedtime                           - Await pathology results.                           - No aspirin, ibuprofen, naproxen, or other                            non-steroidal anti-inflammatory drugs.                           - Repeat upper endoscopy in 6 months for                            surveillance based on pathology results.                           -  CT chest abd & pelvis with contrast to evaluate                            for anorexia and weight loss                           -Will plan for colonoscopy if patient is able to                            drink bowel prep. Procedure Code(s):        --- Professional ---                           [phone], Esophagogastroduodenoscopy, flexible,                            transoral; diagnostic, including collection of                            specimen(s) by brushing or washing, when performed                            (separate procedure) Diagnosis Code(s):        --- Professional ---                           K20.8, Other esophagitis                           Z98.0, Intestinal bypass and anastomosis status                           D50.9, Iron deficiency anemia, unspecified                           R13.10, Dysphagia, unspecified                           R11.10, Vomiting, unspecified CPT copyright 2016 American Medical Association. All rights reserved. The codes documented in this report are preliminary and upon coder review may  be revised to meet current compliance requirements. Napoleon Form, MD 01/15/2017 2:04:29 PM This report has been  signed electronically. Number of Addenda: 0

## 2017-01-15 NOTE — Progress Notes (Signed)
Off unit for EGD.

## 2017-01-15 NOTE — Transfer of Care (Signed)
Immediate Anesthesia Transfer of Care Note  Patient: Laurie Sparks  Procedure(s) Performed: Procedure(s): ESOPHAGOGASTRODUODENOSCOPY (EGD) (N/A)  Patient Location: Endoscopy Unit  Anesthesia Type:MAC  Level of Consciousness: awake, alert  and oriented  Airway & Oxygen Therapy: Patient Spontanous Breathing and Patient connected to nasal cannula oxygen  Post-op Assessment: Report given to RN and Post -op Vital signs reviewed and stable  Post vital signs: Reviewed and stable  Last Vitals:  Vitals:   01/15/17 1045 01/15/17 1237  BP: (!) 102/53 (!) 124/53  Pulse: 89 97  Resp: 12 14  Temp: 36.7 C 36.3 C    Last Pain:  Vitals:   01/15/17 1237  TempSrc: Oral  PainSc: 0-No pain         Complications: No apparent anesthesia complications

## 2017-01-15 NOTE — Anesthesia Postprocedure Evaluation (Signed)
Anesthesia Post Note  Patient: Laurie Sparks  Procedure(s) Performed: Procedure(s) (LRB): ESOPHAGOGASTRODUODENOSCOPY (EGD) (N/A)     Patient location during evaluation: Endoscopy Anesthesia Type: MAC Level of consciousness: awake and oriented Pain management: pain level controlled Vital Signs Assessment: post-procedure vital signs reviewed and stable Respiratory status: spontaneous breathing, nonlabored ventilation, respiratory function stable and patient connected to nasal cannula oxygen Cardiovascular status: blood pressure returned to baseline Anesthetic complications: no    Last Vitals:  Vitals:   01/15/17 1355 01/15/17 1405  BP: 139/66 134/64  Pulse: 99 99  Resp: (!) 25 19  Temp:      Last Pain:  Vitals:   01/15/17 1345  TempSrc: Axillary  PainSc: 0-No pain                 Vernella Niznik COKER

## 2017-01-15 NOTE — H&P (View-Only) (Signed)
[facility] Consult: 12:21 PM 01/14/2017  LOS: 0 days    Referring Provider: Dr Broadus John Primary Care Physician:  Bing Matter PA-C at [place] Primary Gastroenterologist:  unassigned    Reason for Consultation:  Anemia   HPI: Laurie Sparks is a 76 y.o. female.  PMH diastolic CHF.  Hx CAD and MI.  Carotid artery disease.  COPD requiring home oxygen.  Hiatal hernia. S/p at least 2 surgeries to correct large, paraesophageal hernia ~ 2007 in [place] and at [place] which ultimately failed. S/p cholecystectomy.   Chronic failure to thrive. Hip fractureORIF 2010.   Chronic abdominal and musculoskeletal pain, takes ~ 6 Hydrocodone daily.  Cholangitis with sepsis 03/2012, (this after cholecystectomy and failed attempt at [facility] repair).   CT 03/2012: Distorted anatomy with very large hiatal hernia containing stomach, multiple loops of bowel, and pancreas, incompletely visualized.  No evidence of bowel obstruction. Marked dilatation of the common bile duct with at least two intraductal lesions, as described above, including a 2.7 x 1.9 cm obstructing mass near the ampulla. Moderate to severe intrahepatic ductal dilatation S/p PTC drain placement 03/2012.  Status post IR performed sphincterotomy, expulsion of CBD stone and 10 French biliary catheter exchange 04/24/12.  Follow-up cholangiogram 05/05/12 showed patent 12 mm bile duct. 05/20/12 Biliary catheter exchanged along with bile duct brushings/cytology  (no malignant cells).  06/02/12 the drain was removed.    Pt has chronic abd pain and chronic nausea.  Does not vomit a lot.  In recent 2 weeks, not eating, has lost her appetite.  abd pain not increased.  Losing additional weight but already severely cachectic.  Very weak, past 2 days unable to get onto bedside  commode  Current weight is 70#.  Lives with her son.  BMs brown, and uses suppositories to have BMs 2 to 3 x weekly.  Patient helps cleaning up the bedside commode and he has never seen black/melenic or bloody stools.  Son brought her to ED and she was admitted.  SLP and palliative care consults pending.   Baseline hemoglobin is about 11, one year ago. At arrival yesterday hemoglobin was 9.9, 9.7 today. MCV normal. Blood cell count 11.3.  + AKI.  LFTs with t bil 1.4, albumin 3.0 but o/w normal.   FOBT +. AAS films: Unchanged bilateral pleural effusion and bilateral airspace disease. Nonspecific bowel gas pattern with general positive bowel gas. Diffuse osteopenia/osteoporosis. Kyphosis.      Past Medical History:  Diagnosis Date  . AKI (acute kidney injury) ([place]) 01/2017  . CHF (congestive heart failure) ([place])   . COPD (chronic obstructive pulmonary disease) ([place])   . Coronary artery disease   . Depression   . Dyspnea   . Insomnia   . Myocardial infarct ([place])   . Reflux     Past Surgical History:  Procedure Laterality Date  . ABDOMINAL HYSTERECTOMY    . ANKLE SURGERY    . CHOLECYSTECTOMY    . HIATAL HERNIA REPAIR      Prior to Admission medications   Medication Sig Start  Date End Date Taking? Authorizing Provider  aspirin EC 81 MG tablet Take 81 mg by mouth daily.   Yes [provider]  carvedilol (COREG) 12.5 MG tablet Take 12.5 mg by mouth 2 (two) times daily with a meal.   Yes [provider]  ergocalciferol (VITAMIN D2) 50000 units capsule Take 50,000 Units by mouth once a week. Take on Tuesdays   Yes [provider]  furosemide (LASIX) 20 MG tablet Take 1 tablet (20 mg total) by mouth daily. 02/29/16  Yes Kelvin Cellar, MD  HYDROcodone-acetaminophen (NORCO) 7.5-325 MG per tablet Take 1 tablet by mouth every 4 (four) hours.  02/27/12  Yes [provider]  nitroGLYCERIN (NITROSTAT) 0.4 MG SL tablet Place 0.4 mg under the tongue every 5  (five) minutes as needed. For chest pain   Yes [provider]  omeprazole (PRILOSEC) 40 MG capsule Take 40 mg by mouth daily.   Yes [provider]  PROAIR HFA 108 [id number] Base) MCG/ACT inhaler Inhale 1-2 puffs into the lungs every 6 (six) hours as needed for wheezing.  01/09/17  Yes [provider]  sertraline (ZOLOFT) 100 MG tablet Take 200 mg by mouth daily.    Yes [provider]  traZODone (DESYREL) 50 MG tablet Take 75 mg by mouth at bedtime.   Yes [provider]  albuterol (PROVENTIL) (5 MG/ML) 0.5% nebulizer solution Take 0.5 mLs (2.5 mg total) by nebulization 3 (three) times daily as needed for wheezing. Patient not taking: Reported on 01/14/2017 04/07/12 01/14/17  Hosie Poisson, MD  feeding supplement (BOOST / RESOURCE BREEZE) LIQD Take 1 Container by mouth 3 (three) times daily between meals. Patient not taking: Reported on 01/14/2017 02/29/16   Kelvin Cellar, MD  ipratropium (ATROVENT) 0.02 % nebulizer solution Take 2.5 mLs (0.5 mg total) by nebulization 3 (three) times daily as needed for wheezing. Patient not taking: Reported on 01/14/2017 04/07/12 01/14/17  Hosie Poisson, MD  Multiple Vitamin (MULTIVITAMIN WITH MINERALS) TABS Take 1 tablet by mouth daily. Patient not taking: Reported on 01/14/2017 04/07/12   Hosie Poisson, MD    Scheduled Meds: . [START ON 01/17/2017] pantoprazole  40 mg Intravenous Q12H   Infusions: . sodium chloride 75 mL/hr at 01/14/17 0559  . pantoprozole (PROTONIX) infusion 8 mg/hr (01/14/17 1138)   PRN Meds: acetaminophen **OR** acetaminophen, HYDROcodone-acetaminophen, ipratropium-albuterol, ondansetron **OR** ondansetron (ZOFRAN) IV   Allergies as of 01/13/2017 - Review Complete 01/13/2017  Allergen Reaction Noted  . Ambien [zolpidem tartrate] Other (See Comments) 03/24/2012  . Ativan [lorazepam] Other (See Comments) 04/24/2012  . Lactose intolerance (gi)  05/20/2012  . Other  03/24/2012    Family History  Problem  Relation Age of Onset  . Heart failure Mother   . Diabetes Mother     Social History   Social History  . Marital status: Single    Spouse name: N/A  . Number of children: N/A  . Years of education: N/A   Occupational History  . Not on file.   Social History Main Topics  . Smoking status: Never Smoker  . Smokeless tobacco: Never Used  . Alcohol use No  . Drug use: No  . Sexual activity: Not on file   Other Topics Concern  . Not on file   Social History Narrative  . No narrative on file    REVIEW OF SYSTEMS: Constitutional:  Acute on chronic weakness ENT:  No nose bleeds Pulm:  Stable dyspnea and cough. Chronic oxygen. CV:  No palpitations, no LE  edema. No chest pain. GU:  No hematuria, no frequency GI:  Per HPI Heme:  No unusual bleeding or bruising.   Transfusions:  Does not recall previous transfusions. Neuro:  No headaches, no peripheral tingling or numbness Derm:  No itching, no rash or sores.  Endocrine:  No sweats or chills.  No polyuria or dysuria Immunization:  Not queried. Travel:  None beyond local counties in last few months.    PHYSICAL EXAM: Vital signs in last 24 hours: Vitals:   01/14/17 0706 01/14/17 1006  BP: (!) 95/53 (!) 108/55  Pulse:  82  Resp: 14 14  Temp: 97.1 F (36.2 C) 98.5 F (36.9 C)   Wt Readings from Last 3 Encounters:  01/13/17 31.8 kg (70 lb)  02/29/16 30.1 kg (66 lb 6.4 oz)  06/02/12 40.4 kg (89 lb)    General: Extremely cachectic and chronically ill-appearing elderly WF. She is pale. She is in a slightly fetal position on the bed but is awake and alert and answers appropriately. Head:  No facial asymmetry or swelling.  Eyes:  No scleral icterus. Conjunctiva is pale. Ears:  Not hard of hearing.  Nose:  No congestion or discharge Mouth:  Poor dentition.  Slightly dry mucous membranes. Tongue midline. Neck:  No JVD, no masses, no thyromegaly Lungs:  Diminished but clear bilaterally. No labored breathing or  cough. Heart: RRR. No MRG. S1, S2 present. Abdomen:  Soft. No masses. No hepatosplenomegaly. Tender. Bowel sounds active..   Rectal: Do not see any sacral decubiti. No rectal masses. Some external hemorrhoids. Soft stool is brown and trace FOBT positive   Musc/Skeltl: Kyphosis. Severe osteopenic appearance. Extremities:  No CCE. Advance muscular wasting.  Neurologic:  Alert. Oriented times 3. Appropriate. Moves all 4 limbs, strength was not tested. No tremor. Skin:  No sores, rashes or telangiectasia. No significant bruising or purpura. Tattoos:  None seen Nodes:  No cervical adenopathy.   Psych:  Cooperative, flat affect. Calm.  Intake/Output from previous day: No intake/output data recorded. Intake/Output this shift: No intake/output data recorded.  LAB RESULTS:  Recent Labs  01/13/17 2238 01/14/17 0538  WBC 10.8* 11.3*  HGB 9.9* 9.7*  HCT 36.5 35.6*  PLT 188 155   BMET Lab Results  Component Value Date   NA 139 01/14/2017   NA 138 01/13/2017   NA 138 02/29/2016   K 4.3 01/14/2017   K 4.4 01/13/2017   K 4.5 02/29/2016   CL 98 (L) 01/14/2017   CL 98 (L) 01/13/2017   CL 95 (L) 02/29/2016   CO2 25 01/14/2017   CO2 26 01/13/2017   CO2 39 (H) 02/29/2016   GLUCOSE 107 (H) 01/14/2017   GLUCOSE 141 (H) 01/13/2017   GLUCOSE 113 (H) 02/29/2016   BUN 42 (H) 01/14/2017   BUN 41 (H) 01/13/2017   BUN 11 02/29/2016   CREATININE 1.44 (H) 01/14/2017   CREATININE 1.56 (H) 01/13/2017   CREATININE 0.46 02/29/2016   CALCIUM 8.7 (L) 01/14/2017   CALCIUM 8.8 (L) 01/13/2017   CALCIUM 8.7 (L) 02/29/2016   LFT  Recent Labs  01/13/17 2238  PROT 4.9*  ALBUMIN 3.0*  AST 35  ALT 16  ALKPHOS 37*  BILITOT 1.4*   PT/INR Lab Results  Component Value Date   INR 1.26 01/14/2017   INR 0.93 05/20/2012   INR 1.03 05/05/2012   Hepatitis Panel No results for input(s): HEPBSAG, HCVAB, HEPAIGM, HEPBIGM in the last 72 hours. C-Diff No components found for: CDIFF Lipase  Component Value Date/Time   LIPASE 139 (H) 04/01/2012 0415    Drugs of Abuse  No results found for: LABOPIA, COCAINSCRNUR, LABBENZ, AMPHETMU, THCU, LABBARB   RADIOLOGY STUDIES: Dg Abd Acute W/chest  Result Date: 01/14/2017 CLINICAL DATA:  Abdominal pain, nausea, vomiting and diarrhea for several days. EXAM: DG ABDOMEN ACUTE W/ 1V CHEST COMPARISON:  Chest radiographs 02/28/2016 FINDINGS: Bilateral pleural effusions and bibasilar airspace disease, unchanged from prior exam. Cardiomediastinal contours are obscured. Pulmonary vasculature is stable. Patient is kyphotic. Generalized paucity of bowel gas. Minimal air within the sigmoid colon. No significant colonic stool is visualized. Chain sutures suspected in the left upper quadrant. Pelvic calcifications likely phleboliths. No evidence of free air. The bones are diffusely under mineralized. IMPRESSION: 1. Unchanged appearance of the chest with bilateral pleural effusions and bibasilar airspace disease. 2. Nonspecific bowel-gas pattern with generalized paucity of bowel gas. No evidence of free air. 3. Diffuse osteopenia/osteoporosis. Electronically Signed   By: Jeb Levering M.D.   On: 01/14/2017 02:34    ENDOSCOPIC STUDIES: Patient does not recall ever having had a previous colonoscopy.  She has had remote upper endoscopies.  IMPRESSION:   *  FOBT +.  Normocytic anemia in pt with large HH and previous unsuccessful attempts to repair the [facility].  *  Advanced cachexia and malnutrition. Although the acute problems of taking little to no PO have been present for 2 weeks, her physical state suggests a long standing problem with malnutrition.  *  Anorexia, chronic nausea. Suspect this has something to do with her hiatal hernia. She could have some underlying GI or other type of malignancy.   PLAN:     *  Dr. Silverio Decamp will be seeing the patient. Do not think the patient could tolerate a colonoscopy. Perhaps we could pursue upper endoscopy. Question  whether we should begin investigation with CT scan of chest, abdomen pelvis? This could rule out gross neoplastic process and give [place] an idea of her current GI anatomy.   Azucena Freed  01/14/2017, 12:21 PM Pager: [phone]   Attending physician's note   I have taken a history, examined the patient and reviewed the chart. I agree with the Advanced Practitioner's note, impression and recommendations.  76 year old female straightened of large hiatal hernia with chronic dysphagia, intermittent vomiting, regurgitation, severe malnutrition and failure to thrive. Denies any overt GI bleed Her symptoms could be related to large hiatal hernia, will schedule for EGD tomorrow to evaluate and exclude peptic ulcer disease or chronic gastric volvulus  Patient is unlikely to tolerate bowel prep to do colonoscopy Discussed the plan with patient and her son The risks and benefits as well as alternatives of endoscopic procedure(s) have been discussed and reviewed. All questions answered. The patient agrees to proceed.  Further workup based on findings of EGD  K Denzil Magnuson, MD [phone] Mon-Fri 8a-5p [phone] after 5p, weekends, holidays

## 2017-01-15 NOTE — Consult Note (Signed)
Consultation Note Date: 01/15/2017   Patient Name: Laurie Sparks  DOB: 1941/03/04  MRN: [id number]  Age / Sex: 76 y.o., female  PCP: Herbert Pun., MD (Inactive) Referring Physician: Lavina Hamman, MD  Reason for Consultation: Establishing goals of care  HPI/Patient Profile: 76 y.o. female  with past medical history of CHF, CAD, COPD (home O2), hiatal hernia, paraesophageal hernia, chronic FTT admitted on 01/14/2017 with failure to eat and drink for several days. Workup reveals anemia, positive hemoccult, acute kidney injury. Palliative medicine consulted for [place].    Clinical Assessment and Goals of Care: Met with patient and her son. Patient was very pleasant, tells me she is from [place] originally, lives now in [place] with her son, Laurie Sparks who is at the bedside. She gets joy from her family. She is worried about her family trusting one another and there appears to be some type of discord among the family that she is concerned about.  Her last year has been rough. She complains of pain on her R side in her ribs. She doesn't want to eat or drink. She is unable to verbalize her exact health problems, says things are "turned down". She is not oriented to place or time. Unable to tell me how many children she has, or who helps her with her healthcare decision making. However, when I ask if she were to die, if she would want attempts to be made to bring her back to life with CPR she tells me "No".   Laurie Sparks talks to me very reluctantly. He says her health has been going up and down for the last 10 years. He believes this is one of her "down times" and his goals are for her to start eating and drinking again and for her to be able to return home. He would not want her to go to a nursing facility. He says she has maintained her weight around 70 pounds for the last year (accurate assessment- chart review shows  weight in July of 2017- 72lbs; patient now at 74lbs)    When attempting to talk about his mom's various health problems he tells me he doesn't believe she has CHF (chart review shows patient was hospitalized in July 2017 for CHF exacerbation with findings of elevated BNP and pulmonary edema). He doesn't recognize any change in her overall level of functioning in the past year.  We talked about her COPD and he says this is due to lung scarring from her GERD and he does not think this is worsening.   Laurie Sparks does state that patient's quality of life "is not great". He declined to engage in discussion regarding advance directives and GOC. He says that everytime his mother comes in the hospital he has this discussion but she always improves. (Chart review shows no indication of previous Palliative Meetings with this patient or family. )  Laurie Sparks agreed to talk to patient's other children and arrange time to meet tomorrow for further [place] discussion.   Primary Decision Maker NEXT OF KIN- patient's  has 5 children    SUMMARY OF RECOMMENDATIONS -Continue full scope of care -I would like to get more information from other family members about patient's status, have requested full family meeting with all of patient's decision makers- gave my contact information to patient's son to arrange follow up meeting time -[place] consult for interdry sheet placement under breast folds for intertriginous dermatitis    Code Status/Advance Care Planning:  Full code  Palliative Prophylaxis:   Delirium Protocol and Frequent Pain Assessment  Additional Recommendations (Limitations, Scope, Preferences):  Full Scope Treatment  Prognosis:    Unable to determine  Discharge Planning: To Be Determined  Primary Diagnoses: Present on Admission: . GI bleed . AKI (acute kidney injury) ([place]) . Leukocytosis . COPD (chronic obstructive pulmonary disease) ([place]) . Chronic respiratory failure with hypoxia ([place])   I  have reviewed the medical record, interviewed the patient and family, and examined the patient. The following aspects are pertinent.  Past Medical History:  Diagnosis Date  . AKI (acute kidney injury) ([place]) 01/2017  . CHF (congestive heart failure) ([place])   . COPD (chronic obstructive pulmonary disease) ([place])   . Coronary artery disease   . Depression   . Dyspnea   . Insomnia   . Myocardial infarct ([place])   . Reflux    Social History   Social History  . Marital status: Single    Spouse name: N/A  . Number of children: N/A  . Years of education: N/A   Social History Main Topics  . Smoking status: Never Smoker  . Smokeless tobacco: Never Used  . Alcohol use No  . Drug use: No  . Sexual activity: Not Asked   Other Topics Concern  . None   Social History Narrative  . None   Family History  Problem Relation Age of Onset  . Heart failure Mother   . Diabetes Mother    Scheduled Meds: . cyanocobalamin  1,000 mcg Subcutaneous Daily  . feeding supplement  1 Container Oral TID BM  . multivitamin with minerals  1 tablet Oral Daily  . pantoprazole  40 mg Intravenous Q12H  . thiamine injection  100 mg Intravenous Daily   Continuous Infusions: PRN Meds:.acetaminophen **OR** acetaminophen, ipratropium-albuterol, ondansetron **OR** ondansetron (ZOFRAN) IV, traMADol Medications Prior to Admission:  Prior to Admission medications   Medication Sig Start Date End Date Taking? Authorizing Provider  aspirin EC 81 MG tablet Take 81 mg by mouth daily.   Yes [provider]  carvedilol (COREG) 12.5 MG tablet Take 12.5 mg by mouth 2 (two) times daily with a meal.   Yes [provider]  ergocalciferol (VITAMIN D2) 50000 units capsule Take 50,000 Units by mouth once a week. Take on Tuesdays   Yes [provider]  furosemide (LASIX) 20 MG tablet Take 1 tablet (20 mg total) by mouth daily. 02/29/16  Yes Kelvin Cellar, MD  HYDROcodone-acetaminophen (NORCO) 7.5-325  MG per tablet Take 1 tablet by mouth every 4 (four) hours.  02/27/12  Yes [provider]  nitroGLYCERIN (NITROSTAT) 0.4 MG SL tablet Place 0.4 mg under the tongue every 5 (five) minutes as needed. For chest pain   Yes [provider]  omeprazole (PRILOSEC) 40 MG capsule Take 40 mg by mouth daily.   Yes [provider]  PROAIR HFA 108 (90 Base) MCG/ACT inhaler Inhale 1-2 puffs into the lungs every 6 (six) hours as needed for wheezing.  01/09/17  Yes [provider]  sertraline (ZOLOFT) 100 MG tablet  Take 200 mg by mouth daily.    Yes [provider]  traZODone (DESYREL) 50 MG tablet Take 75 mg by mouth at bedtime.   Yes [provider]  albuterol (PROVENTIL) (5 MG/ML) 0.5% nebulizer solution Take 0.5 mLs (2.5 mg total) by nebulization 3 (three) times daily as needed for wheezing. Patient not taking: Reported on 01/14/2017 04/07/12 01/14/17  Hosie Poisson, MD  feeding supplement (BOOST / RESOURCE BREEZE) LIQD Take 1 Container by mouth 3 (three) times daily between meals. Patient not taking: Reported on 01/14/2017 02/29/16   Kelvin Cellar, MD  ipratropium (ATROVENT) 0.02 % nebulizer solution Take 2.5 mLs (0.5 mg total) by nebulization 3 (three) times daily as needed for wheezing. Patient not taking: Reported on 01/14/2017 04/07/12 01/14/17  Hosie Poisson, MD  Multiple Vitamin (MULTIVITAMIN WITH MINERALS) TABS Take 1 tablet by mouth daily. Patient not taking: Reported on 01/14/2017 04/07/12   Hosie Poisson, MD   Allergies  Allergen Reactions  . Ambien [Zolpidem Tartrate] Other (See Comments)    Hallucinate   . Ativan [Lorazepam] Other (See Comments)    Hallucinations   . Lactose Intolerance (Gi)   . Other     Base metal and something else   Review of Systems  Physical Exam  Constitutional:  cachetic  Pulmonary/Chest: She has wheezes.  Labored at rest  Skin:  R breast fold erythmatous and foul smelling  Nursing note and vitals reviewed.   Vital  Signs: BP (!) 102/53 (BP Location: Left Arm)   Pulse 89   Temp 98.1 F (36.7 C) (Oral)   Resp 12   Ht 5' (1.524 m)   Wt 34 kg (74 lb 14.4 oz)   SpO2 96%   BMI 14.63 kg/m  Pain Assessment: 0-10   Pain Score: Asleep   SpO2: SpO2: 96 % O2 Device:SpO2: 96 % O2 Flow Rate: .O2 Flow Rate (L/min): 4 L/min  IO: Intake/output summary:  Intake/Output Summary (Last 24 hours) at 01/15/17 1100 Last data filed at 01/15/17 1045  Gross per 24 hour  Intake          3028.75 ml  Output              200 ml  Net          2828.75 ml    LBM: Last BM Date: 01/13/17 Baseline Weight: Weight: 31.8 kg (70 lb) Most recent weight: Weight: 34 kg (74 lb 14.4 oz)     Palliative Assessment/Data: PPS: 20 %     Thank you for this consult. Palliative medicine will continue to follow and assist as needed.    Time In: 0930 Time Out: 1045 Time Total: 75 minutes Greater than 50%  of this time was spent counseling and coordinating care related to the above assessment and plan.  Signed by: Mariana Kaufman, AGNP-C Palliative Medicine    Please contact Palliative Medicine Team phone at [phone] for questions and concerns.  For individual provider: See Shea Evans

## 2017-01-16 DIAGNOSIS — R627 Adult failure to thrive: Secondary | ICD-10-CM

## 2017-01-16 LAB — CBC WITH DIFFERENTIAL/PLATELET
BASOS ABS: 0 10*3/uL (ref 0.0–0.1)
BASOS PCT: 0 %
Eosinophils Absolute: 0 10*3/uL (ref 0.0–0.7)
Eosinophils Relative: 0 %
HEMATOCRIT: 34.7 % — AB (ref 36.0–46.0)
HEMOGLOBIN: 9 g/dL — AB (ref 12.0–15.0)
LYMPHS PCT: 9 %
Lymphs Abs: 0.9 10*3/uL (ref 0.7–4.0)
MCH: 22.8 pg — ABNORMAL LOW (ref 26.0–34.0)
MCHC: 25.9 g/dL — ABNORMAL LOW (ref 30.0–36.0)
MCV: 87.8 fL (ref 78.0–100.0)
Monocytes Absolute: 1.4 10*3/uL — ABNORMAL HIGH (ref 0.1–1.0)
Monocytes Relative: 13 %
NEUTROS ABS: 7.9 10*3/uL — AB (ref 1.7–7.7)
NEUTROS PCT: 78 %
Platelets: 154 10*3/uL (ref 150–400)
RBC: 3.95 MIL/uL (ref 3.87–5.11)
RDW: 18 % — ABNORMAL HIGH (ref 11.5–15.5)
WBC: 10.2 10*3/uL (ref 4.0–10.5)

## 2017-01-16 LAB — RENAL FUNCTION PANEL
Albumin: 3.1 g/dL — ABNORMAL LOW (ref 3.5–5.0)
Anion gap: 8 (ref 5–15)
BUN: 28 mg/dL — ABNORMAL HIGH (ref 6–20)
CHLORIDE: 101 mmol/L (ref 101–111)
CO2: 30 mmol/L (ref 22–32)
CREATININE: 0.7 mg/dL (ref 0.44–1.00)
Calcium: 8.5 mg/dL — ABNORMAL LOW (ref 8.9–10.3)
GFR calc Af Amer: >60 mL/min (ref >60)
GFR calc non Af Amer: >60 mL/min (ref >60)
GLUCOSE: 151 mg/dL — AB (ref 65–99)
POTASSIUM: 3.6 mmol/L (ref 3.5–5.1)
Phosphorus: 2.2 mg/dL — ABNORMAL LOW (ref 2.5–4.6)
Sodium: 139 mmol/L (ref 135–145)

## 2017-01-16 LAB — MAGNESIUM: Magnesium: 1.8 mg/dL (ref 1.7–2.4)

## 2017-01-16 MED ORDER — ENSURE ENLIVE PO LIQD
237.0000 mL | Freq: Three times a day (TID) | ORAL | Status: DC
  Administered 2017-01-16 – 2017-01-19 (×8): 237 mL via ORAL

## 2017-01-16 MED ORDER — PANTOPRAZOLE SODIUM 40 MG PO TBEC
40.0000 mg | DELAYED_RELEASE_TABLET | Freq: Two times a day (BID) | ORAL | Status: DC
  Administered 2017-01-16 – 2017-01-19 (×7): 40 mg via ORAL
  Filled 2017-01-16 (×8): qty 1

## 2017-01-16 MED ORDER — VITAMIN B-1 100 MG PO TABS
100.0000 mg | ORAL_TABLET | Freq: Every day | ORAL | Status: DC
  Administered 2017-01-17 – 2017-01-19 (×3): 100 mg via ORAL
  Filled 2017-01-16 (×3): qty 1

## 2017-01-16 NOTE — Progress Notes (Signed)
[facility] Progress Note  Patient: Laurie Sparks WUJ:[id number]   PCP: Halina MaidensStallings, Sheila C., MD (Inactive) DOB: Mar 09, 1941   DOA: 01/14/2017   DOS: 01/16/2017   Date of Service: the patient was seen and examined on 01/16/2017  Subjective: Patient shows no evidence of agitation but remains confused. No acute events overnight.  Brief hospital course: Pt. with PMH of severe COPD, chronic respiratory failure on 3 L of oxygen, COPD, chronic diastolic CHF, GERD, hiatal hernia; admitted on 01/14/2017, presented with complaint of poor oral intake, was found to have acute kidney injury, symptomatic anemia. Currently further plan is GI workup and goals of care discussion.  Assessment and Plan: 1. Failure to thrive. Protein calorie malnutrition, severe. 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 Patient primarily presented with poor by mouth intake as well as lethargy. Appears persistently more lethargic. By mouth intake still limited although the patient is nothing by mouth for EGD. Dietary consulted, speech therapy consulted recommended to advance the diet as tolerated. While her son who is at bedside mentions that this has been fairly recent that she has not been eating anything for last 2 weeks it appears by the degree of malnutrition that this is a long-standing issue likely contributed from her chronic nausea and vomiting as well as large hiatal hernia and COPD. Palliative care consulted for further discussion on goals of care.  2. Acute kidney injury. Based on renal function and serum creatinine running around 0.5. Presented with serum creatinine of 1.56 and BUN of 41. Currently getting better This appears significant improvement with IV hydration. Lactic contributing to patient's lethargy as well as encephalopathy. We'll monitor. Gentle IV hydration will continue.  3. Acute encephalopathy. Per son the patient is acutely confused and at her baseline is fairly active. I suspect the patient may have early signs  of dementia which the family has not identified at home and currently appears to have delirium from acute kidney injury as well as poor by mouth intake. I will start the patient on multivitamins, B-12 subcutaneous, thiamine IV 1 followed by oral. Normal TSH and free T4. Normal ammonia.  4. COPD. Chronic respiratory failure. Chronic Bilateral pleural effusion. Patient has severe COPD at her baseline no evidence of acute exacerbation. Continue when necessary DuoNeb's and had scheduled dulera. Also has bilateral pleural effusion unchanged from an x-ray done a year ago. CT chest abdomen and pelvis shows no malignancy but shows that her small bowel is in her chest due to hernia.  5. Suspected GI bleed. Suspected acute blood loss anemia, more likely chronic anemia from nutritional deficiency. Patient presents with fatigue and lethargy. On admission her hemoglobin was 9.9, which is a significant drop from her baseline. No evidence of active bleeding reported per family. No evidence of active bleeding here in the hospital as well. GI was consulted, patient was started on IV Protonix drip. At present I would discontinue the IV Protonix drip and place the patient on every 12 hours Protonix. EGD shows erosive esophagitis.  6. Hiatal hernia. Appreciate surgical consultation requested by GI. Patient is not a candidate for any surgical intervention given her severe COPD as well as protein calorie malnutrition and undiagnosed dementia. Performing open jejunostomy is also not a safe option for this patient. We'll discuss with family regarding goals of care tomorrow.  Diet: Dysphagia 1 aspiration precaution. DVT Prophylaxis: mechanical compression device  Advance goals of care discussion: No family at bedside  Family Communication: no family was present at bedside, at the time of interview.  Disposition:  Discharge to be determine.  Consultants: Gastroenterology, palliative care Procedures:  EGD  Antibiotics: Anti-infectives    None       Objective: Physical Exam: Vitals:   01/15/17 2129 01/16/17 0413 01/16/17 0748 01/16/17 1758  BP:  (!) 100/42 (!) 114/46 (!) 120/56  Pulse:  89 89 (!) 102  Resp:  18 16 16   Temp:  98.2 F (36.8 C) 98.1 F (36.7 C) 98.2 F (36.8 C)  TempSrc:  Oral Oral Oral  SpO2:  96% 95% 95%  Weight: 34.9 kg (77 lb)     Height:        Intake/Output Summary (Last 24 hours) at 01/16/17 1803 Last data filed at 01/16/17 1759  Gross per 24 hour  Intake              890 ml  Output              200 ml  Net              690 ml   Filed Weights   01/13/17 2226 01/14/17 1700 01/15/17 2129  Weight: 31.8 kg (70 lb) 34 kg (74 lb 14.4 oz) 34.9 kg (77 lb)   General: Alert, Awake and Oriented to self . Appear in moderate distress, affect flat Eyes: PERRL, Conjunctiva normal ENT: Oral Mucosa clear moist. Neck: difficult to assess JVD, bi Abnormal Mass Or lumps Cardiovascular: S1 and S2 Present, no Murmur, Peripheral Pulses Present Respiratory: normal respiratory effort, Bilateral Air entry equal and Decreased, no use of accessory muscle, basal Crackles, no wheezes Abdomen: Bowel Sound present, Soft and no tenderness,  Skin: no redness, no Rash, no induration Extremities: no Pedal edema, no calf tenderness Neurologic: Grossly no focal neuro deficit. Bilaterally Equal motor strength  Data Reviewed: CBC:  Recent Labs Lab 01/13/17 2238 01/14/17 0538 01/14/17 1554 01/15/17 0635 01/16/17 0501  WBC 10.8* 11.3*  --  10.8* 10.2  NEUTROABS 9.1*  --   --   --  7.9*  HGB 9.9* 9.7* 8.7* 9.0* 9.0*  HCT 36.5 35.6* 32.2* 34.0* 34.7*  MCV 82.8 82.8  --  85.6 87.8  PLT 188 155  --  166 154   Basic Metabolic Panel:  Recent Labs Lab 01/13/17 2238 01/14/17 0538 01/15/17 0635 01/16/17 0501  NA 138 139 141 139  K 4.4 4.3 3.7 3.6  CL 98* 98* 105 101  CO2 26 25 26 30   GLUCOSE 141* 107* 107* 151*  BUN 41* 42* 35* 28*  CREATININE 1.56* 1.44* 0.99 0.70   CALCIUM 8.8* 8.7* 8.3* 8.5*  MG  --   --   --  1.8  PHOS  --   --   --  2.2*    Liver Function Tests:  Recent Labs Lab 01/13/17 2238 01/15/17 0635 01/16/17 0501  AST 35 32  --   ALT 16 15  --   ALKPHOS 37* 38  --   BILITOT 1.4* 0.9  --   PROT 4.9* 5.1*  --   ALBUMIN 3.0* 3.0* 3.1*   No results for input(s): LIPASE, AMYLASE in the last 168 hours.  Recent Labs Lab 01/15/17 1126  AMMONIA 26   Coagulation Profile:  Recent Labs Lab 01/14/17 0117  INR 1.26   Studies: Ct Abdomen Pelvis Wo Contrast  Result Date: 01/15/2017 CLINICAL DATA:  Nausea and vomiting. Bilateral pleural effusion. Abnormal weight loss. EXAM: CT CHEST, ABDOMEN AND PELVIS WITHOUT CONTRAST TECHNIQUE: Multidetector CT imaging of the chest, abdomen and pelvis was performed  following the standard protocol without IV contrast. COMPARISON:  Radiographs yesterday. FINDINGS: Exaggerated kyphosis distorts normal anatomy. CT CHEST FINDINGS Cardiovascular: Atherosclerosis and tortuosity of the thoracic aorta. There are coronary artery calcifications. The heart is normal in size, displaced anteriorly. No evidence pericardial effusion. Mediastinum/Nodes: Normal diaphoretic contours not well visualized, there is herniation of abdominal contents within the lower thorax, including small bowel and portions of colon. Limited assessment for adenopathy given the distortion of normal anatomy. There is enteric contrast within the esophagus. The gastroesophageal junction and stomach are not well-defined. Lungs/Pleura: Compressive atelectasis in the lower lungs secondary to herniation of intra-abdominal contents. Near complete atelectasis of the right lower and right middle lobes. Near complete atelectasis of the left lower lobe, with minimal superior segment aerated. There are small bilateral pleural effusions, right greater than left. Upper lungs are clear. Musculoskeletal: Extensive exaggerated thoracic kyphosis. Diffuse bony under  mineralization. Mild compression fracture of T8. No lytic or blastic osseous lesions. CT ABDOMEN PELVIS FINDINGS Hepatobiliary: Postcholecystectomy. No evidence of focal hepatic lesion allowing for lack contrast. Common bile duct is not well-defined. Pancreas: Not well visualized, may be in part intrathoracic. Spleen: Normal in size without focal abnormality. Adrenals/Urinary Tract: 5 mm nonobstructing stone in the left kidney. No hydronephrosis. Adrenal glands are not well visualized. There is a surgical clip at the level of the left renal vein. Urinary bladder is physiologically distended, no wall thickening. Stomach/Bowel: Normal bowel anatomy is distorted. Majority of the small bowel appears intrathoracic. Portions of the transverse and descending colon are intrathoracic. There is evidence of colonic wall thickening in the central abdomen, likely the ascending colon which is displaced centrally. The appendix is not confidently visualized. No bowel obstruction, enteric contrast progresses to the sigmoid. Vascular/Lymphatic: Atherosclerosis of the abdominal aorta. Diminish sensitivity for adenopathy given lack of contrast and paucity of intra-abdominal fat. No gross bulky adenopathy. Reproductive: Reported hysterectomy, uterus is not visualized. Other: Small volume of intra-abdominal and pelvic ascites. There is whole body wall edema. Musculoskeletal: Diffuse bony under mineralization. Compression fracture of L1 with 75% loss of height centrally and retropulsion of posterior cortex. Moderate compression fracture of L5 with approximately 50% vertebral body height loss. Milder compression fractures of L2, L4 and possibly L3. Postsurgical change in the left hip. IMPRESSION: 1. Normal diaphragmatic contours are not well-defined, there is herniation of majority of small bowel and portions of the colon into the lower thorax, likely through a large hernia at the aortic hiatus. No obstruction. 2. Associated atelectasis at  the lung bases with near complete atelectasis/compression of the right middle and both lower lobes secondary to herniated bowel. Small bilateral pleural effusions. 3. Colonic wall thickening in the central abdomen, likely displaced ascending colon, suspicious for colitis. 4. Small amount of abdominopelvic ascites. Whole body wall edema. Findings suggest third-spacing. 5. Diffuse osteoporosis with multiple compression fractures of the thoracolumbar spine, most significant at L1. 6. Technically difficult evaluation due to exaggerated thoracic kyphosis and distortion of normal anatomy. Electronically Signed   By: Rubye Oaks M.D.   On: 01/15/2017 22:20   Ct Chest Wo Contrast  Result Date: 01/15/2017 CLINICAL DATA:  Nausea and vomiting. Bilateral pleural effusion. Abnormal weight loss. EXAM: CT CHEST, ABDOMEN AND PELVIS WITHOUT CONTRAST TECHNIQUE: Multidetector CT imaging of the chest, abdomen and pelvis was performed following the standard protocol without IV contrast. COMPARISON:  Radiographs yesterday. FINDINGS: Exaggerated kyphosis distorts normal anatomy. CT CHEST FINDINGS Cardiovascular: Atherosclerosis and tortuosity of the thoracic aorta. There are coronary artery calcifications.  The heart is normal in size, displaced anteriorly. No evidence pericardial effusion. Mediastinum/Nodes: Normal diaphoretic contours not well visualized, there is herniation of abdominal contents within the lower thorax, including small bowel and portions of colon. Limited assessment for adenopathy given the distortion of normal anatomy. There is enteric contrast within the esophagus. The gastroesophageal junction and stomach are not well-defined. Lungs/Pleura: Compressive atelectasis in the lower lungs secondary to herniation of intra-abdominal contents. Near complete atelectasis of the right lower and right middle lobes. Near complete atelectasis of the left lower lobe, with minimal superior segment aerated. There are small  bilateral pleural effusions, right greater than left. Upper lungs are clear. Musculoskeletal: Extensive exaggerated thoracic kyphosis. Diffuse bony under mineralization. Mild compression fracture of T8. No lytic or blastic osseous lesions. CT ABDOMEN PELVIS FINDINGS Hepatobiliary: Postcholecystectomy. No evidence of focal hepatic lesion allowing for lack contrast. Common bile duct is not well-defined. Pancreas: Not well visualized, may be in part intrathoracic. Spleen: Normal in size without focal abnormality. Adrenals/Urinary Tract: 5 mm nonobstructing stone in the left kidney. No hydronephrosis. Adrenal glands are not well visualized. There is a surgical clip at the level of the left renal vein. Urinary bladder is physiologically distended, no wall thickening. Stomach/Bowel: Normal bowel anatomy is distorted. Majority of the small bowel appears intrathoracic. Portions of the transverse and descending colon are intrathoracic. There is evidence of colonic wall thickening in the central abdomen, likely the ascending colon which is displaced centrally. The appendix is not confidently visualized. No bowel obstruction, enteric contrast progresses to the sigmoid. Vascular/Lymphatic: Atherosclerosis of the abdominal aorta. Diminish sensitivity for adenopathy given lack of contrast and paucity of intra-abdominal fat. No gross bulky adenopathy. Reproductive: Reported hysterectomy, uterus is not visualized. Other: Small volume of intra-abdominal and pelvic ascites. There is whole body wall edema. Musculoskeletal: Diffuse bony under mineralization. Compression fracture of L1 with 75% loss of height centrally and retropulsion of posterior cortex. Moderate compression fracture of L5 with approximately 50% vertebral body height loss. Milder compression fractures of L2, L4 and possibly L3. Postsurgical change in the left hip. IMPRESSION: 1. Normal diaphragmatic contours are not well-defined, there is herniation of majority of  small bowel and portions of the colon into the lower thorax, likely through a large hernia at the aortic hiatus. No obstruction. 2. Associated atelectasis at the lung bases with near complete atelectasis/compression of the right middle and both lower lobes secondary to herniated bowel. Small bilateral pleural effusions. 3. Colonic wall thickening in the central abdomen, likely displaced ascending colon, suspicious for colitis. 4. Small amount of abdominopelvic ascites. Whole body wall edema. Findings suggest third-spacing. 5. Diffuse osteoporosis with multiple compression fractures of the thoracolumbar spine, most significant at L1. 6. Technically difficult evaluation due to exaggerated thoracic kyphosis and distortion of normal anatomy. Electronically Signed   By: Rubye Oaks M.D.   On: 01/15/2017 22:20    Scheduled Meds: . cyanocobalamin  1,000 mcg Subcutaneous Daily  . feeding supplement (ENSURE ENLIVE)  237 mL Oral TID BM  . multivitamin with minerals  1 tablet Oral Daily  . nystatin   Topical q morning - 10a  . pantoprazole  40 mg Oral BID  . sucralfate  1 g Oral TID WC & HS  . [START ON 01/17/2017] thiamine  100 mg Oral Daily   Continuous Infusions: PRN Meds: acetaminophen **OR** acetaminophen, ipratropium-albuterol, ondansetron **OR** ondansetron (ZOFRAN) IV, traMADol  Time spent: 30 minutes  Author: Lynden Oxford, MD [facility] Pager: [phone] 01/16/2017 6:03 PM  If 7PM-7AM, please contact night-coverage at [email or web address], password [facility]

## 2017-01-16 NOTE — Care Management Note (Signed)
Case Management Note  Patient Details  Name: Laurie Sparks MRN: [id number] Date of Birth: 1941-03-26  Subjective/Objective:    CM following for progression and d/c planning.                 Action/Plan: 01/16/2017 Noted findings of EGD, and plan for surgical consult. CM will continue to follow for d/c needs.   Expected Discharge Date:                  Expected Discharge Plan:  Skilled Nursing Facility  In-House Referral:  Clinical Social Work  Discharge planning Services  CM Consult  Post Acute Care Choice:  NA Choice offered to:     DME Arranged:    DME Agency:     HH Arranged:    HH Agency:     Status of Service:  In process, will continue to follow  If discussed at Long Length of Stay Meetings, dates discussed:    Additional Comments:  Laurie Sparks, Laurie Chillemi U, RN 01/16/2017, 2:30 PM

## 2017-01-16 NOTE — Progress Notes (Addendum)
No charge note.   Patient's son did not contact Palliative medicine team today to schedule follow up meeting as requested. Will continue to follow and try to contact.   Ocie BobKasie Mahan, AGNP-C Palliative Medicine  Please call Palliative Medicine team phone with any questions [phone]. For individual providers please see AMION.

## 2017-01-16 NOTE — Evaluation (Signed)
Occupational Therapy Evaluation Patient Details Name: Laurie Sparks MRN: [id number] DOB: 07-13-1941 Today's Date: 01/16/2017    History of Present Illness Patient is a 76 yo female admitted 01/14/17 with increased weakness and decreased appetite.  Patient with GIB and anemia.    PMH:  CHF, COPD, on 3L O2 at home, CAD, depression, MI, chronic pain   Clinical Impression   Per son; pt requiring assist with ADL PTA. Currently pt requires mod assist for stand pivot transfers and max-total assist for ADL. Pt presenting with impaired cognition, poor sitting/standing balance, generalized weakness, and decreased activity tolerance impacting her independence and safety with ADL and functional mobility. Recommending SNF for follow up to maximize independence and safety with ADL and functional mobility prior to return home with family. Pt would benefit from continued skilled OT to address established goals.    Follow Up Recommendations  SNF;Supervision/Assistance - 24 hour    Equipment Recommendations  None recommended by OT    Recommendations for Other Services       Precautions / Restrictions Precautions Precautions: Fall Restrictions Weight Bearing Restrictions: No      Mobility Bed Mobility Overal bed mobility: Needs Assistance Bed Mobility: Supine to Sit;Sit to Supine     Supine to sit: Min guard;HOB elevated Sit to supine: Min assist;HOB elevated   General bed mobility comments: assist for LEs back to bed. Needs cues for initiation and sequencing of bed mobility. HOB elevated with use of bed rails.   Transfers Overall transfer level: Needs assistance Equipment used: Rolling walker (2 wheeled);1 person hand held assist Transfers: Sit to/from [employer] Sit to Stand: Mod assist Stand pivot transfers: Mod assist       General transfer comment: Cues for hand placement and sequencing. Stand pivot EOB>chair with HHA, chair>EOB with RW; mod assit required for both     Balance Overall balance assessment: Needs assistance Sitting-balance support: Feet supported;Bilateral upper extremity supported Sitting balance-Leahy Scale: Fair     Standing balance support: Bilateral upper extremity supported Standing balance-Leahy Scale: Poor                             ADL either performed or assessed with clinical judgement   ADL Overall ADL's : Needs assistance/impaired   Eating/Feeding Details (indicate cue type and reason): RN reports she provided assist this AM for feeding so pt could receive increased PO intake. Grooming: Moderate assistance;Sitting   Upper Body Bathing: Maximal assistance;Sitting   Lower Body Bathing: Total assistance;Sit to/from stand   Upper Body Dressing : Maximal assistance;Sitting Upper Body Dressing Details (indicate cue type and reason): to doff/don gown Lower Body Dressing: Total assistance;Sit to/from stand   Toilet Transfer: Moderate assistance;Stand-pivot;BSC;RW   Toileting- Clothing Manipulation and Hygiene: Total assistance;Sit to/from stand       Functional mobility during ADLs: Moderate assistance;Rolling walker (for stand pivot only)       Vision         Perception     Praxis      Pertinent Vitals/Pain Pain Assessment: Faces Faces Pain Scale: No hurt     Hand Dominance Right   Extremity/Trunk Assessment Upper Extremity Assessment Upper Extremity Assessment: Generalized weakness   Lower Extremity Assessment Lower Extremity Assessment: Defer to PT evaluation   Cervical / Trunk Assessment Cervical / Trunk Assessment: Kyphotic   Communication Communication Communication: HOH   Cognition Arousal/Alertness: Awake/alert Behavior During Therapy: Flat affect Overall Cognitive Status: Impaired/Different from baseline Area  of Impairment: Following commands;Awareness;Problem solving                       Following Commands: Follows one step commands consistently;Follows one  step commands with increased time   Awareness: Intellectual Problem Solving: Slow processing;Decreased initiation;Difficulty sequencing;Requires verbal cues;Requires tactile cues General Comments: Son reports decline in cognition over the past few days. Pt seems to engage in conversation and follow commands at times and then at other times will just stare at you when asking question or cueing pt for task.   General Comments       Exercises     Shoulder Instructions      Home Living Family/patient expects to be discharged to:: Private residence Living Arrangements: Children Available Help at Discharge: Family;Available 24 hours/day Type of Home: House Home Access: Stairs to enter [employer] CorporationEntrance Stairs-Number of Steps: 4 Entrance Stairs-Rails: None Home Layout: One level (split level with 1 step between several levels of house)               Home Equipment: Cane - single point;Wheelchair - [employer] Corporationmanual;Bedside commode;Other (comment) (3L home O2)   Additional Comments: Patient able to stay in one area of house - to avoid stairs.  Patient reports she mostly stays in bed now.      Prior Functioning/Environment Level of Independence: Needs assistance  Gait / Transfers Assistance Needed: Per patient, son puts her in w/c for mobility ADL's / Homemaking Assistance Needed: Son assists with meal prep and housekeeping.  He provides supervision for patient to sponge bathe. [facility]; does not go in bathroom            OT Problem List: Decreased strength;Decreased range of motion;Decreased activity tolerance;Impaired balance (sitting and/or standing);Decreased cognition;Decreased safety awareness;Decreased knowledge of use of DME or AE;Impaired UE functional use      OT Treatment/Interventions: Self-care/ADL training;Therapeutic exercise;Energy conservation;DME and/or AE instruction;Therapeutic activities;Cognitive remediation/compensation;Patient/family education;Balance  training    OT Goals(Current goals can be found in the care plan section) Acute Rehab OT Goals Patient Stated Goal: none stated OT Goal Formulation: With patient/family Time For Goal Achievement: 01/30/17 Potential to Achieve Goals: Fair ADL Goals Pt Will Perform Eating: with set-up;with supervision;sitting Pt Will Perform Grooming: with set-up;with supervision;sitting (x3 tasks) Pt Will Transfer to Toilet: with min guard assist;stand pivot transfer;bedside commode  OT Frequency: Min 2X/week   Barriers to D/C:            Co-evaluation              AM-PAC PT "6 Clicks" Daily Activity     Outcome Measure Help from another person eating meals?: A Little Help from another person taking care of personal grooming?: A Lot Help from another person toileting, which includes using toliet, bedpan, or urinal?: A Lot Help from another person bathing (including washing, rinsing, drying)?: A Lot Help from another person to put on and taking off regular upper body clothing?: A Lot Help from another person to put on and taking off regular lower body clothing?: Total 6 Click Score: 12   End of Session Equipment Utilized During Treatment: Rolling walker;Oxygen Nurse Communication: Mobility status;Other (comment) (pt with BM x2 during session)  Activity Tolerance: Patient tolerated treatment well Patient left: in bed;with call bell/phone within reach;with family/visitor present;Other (comment) (with surgery PA)  OT Visit Diagnosis: Unsteadiness on feet (R26.81);Other abnormalities of gait and mobility (R26.89);Muscle weakness (generalized) (M62.81);Adult, failure to thrive (R62.7)  Time: 5409-8119 OT Time Calculation (min): 32 min Charges:  OT General Charges $OT Visit: 1 Procedure OT Evaluation $OT Eval Moderate Complexity: 1 Procedure OT Treatments $Self Care/Home Management : 8-22 mins G-Codes:     Fredric Mare A. Brett Albino, M.S., OTR/L Pager: [phone]  Gaye Alken 01/16/2017, 3:29 PM

## 2017-01-16 NOTE — Consult Note (Signed)
Reason for Consult:  Hiatal hernia, failure to thrive, possible palliative feeding Jejunostomy tube Referring Physician: Dr. Silverio Decamp  CC:  Not eating, lethargic, slurred speech, loose stools 3 days.  Laurie Sparks is an 76 y.o. female.  HPI: This is a severely cachectic 76 year old female with a complicated history.  It sounds like she was to have a cholecystectomy around 2007, and had some terrible event after the cholecystectomy.  She had a second surgery and developed a necrotic stomach and the stomach and esophageus were in discontinuity for some months while she go over the initial event.  She was on a tube feeding of some type at that point. She then went to [facility] and underwent some type of surgery that put things back together, around 2008 or 2009, son is not sure.  She has a known hiatal hernia since that time.   She has a hx of cholangitis with  Left percutaneous transhepatic cholangiogram and placement of a percutaneous biliary drain,back in 2013.  She has been  hospitalized multiple times here and at [facility] with abdominal pain and multiple medical issues.  Her son says she has never been able to eat normally since she had her surgery back in 2009.  He notes she got some better, but has never done well since the surgery at [facility] to put her back together.  She can only eat small amounts and has pain after each meal, and then has to lie down.  He does not know about weight loss, she has not done well for years.  Her activity is very limited she gets up to go to [facility] and that is about all.  She likes their porch, but has trouble traversing the steps to get there.  Over the last 2 weeks, and family notes she is confused, mentation is slow, and she will look at you with eyes open and not respond at times now.  She has been told she would not survive multiple times but always seems to come thru.  Her son Legrand Como seems to do the most to care for her and is with her now.   He answer almost all of the question, and she did not speak till the end of the interview and exam.   She has been O2 dependent, on home oxygen complicated by diastolic congestive heart failure and pleural effusions.     CT scan here on 01/15/17 shows: Normal diaphragmatic contours are not well-defined, there is herniation of majority of small bowel and portions of the colon into the lower thorax, likely through a large hernia at the aortic hiatus. No obstruction.  Associated atelectasis at the lung bases with near complete atelectasis/compression of the right middle and both lower lobes secondary to herniated bowel. Small bilateral pleural effusions.  Colonic wall thickening in the central abdomen, likely displaced ascending colon, suspicious for colitis.  Small amount of abdominopelvic ascites. Whole body wall edema.  Findings suggest third-spacing.   Diffuse osteoporosis with multiple compression fractures of the thoracolumbar spine, most significant at L1.  Technically difficult evaluation due to exaggerated thoracic kyphosis and distortion of normal anatomy.  She underwent EGD yesterday by Dr. Silverio Decamp, and this showed:  Moderately severe erosive esophagitis. Biopsied.                           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                           - Normal examined Duodenum  We are ask to see and discuss possible surgical treatment of the hernia which they do not believe is a viable option.  Dr. Bari Edward would also like [place] to consider possible feeding jejunostomy, more as a palliative measure. Family is talking with palliative care and another meeting is scheduled for today with more of the family.     Past Medical History:  Diagnosis Date  . AKI (acute kidney injury) ([place]) February 08, 2017  . CHF (congestive heart failure) ([place])   . COPD (chronic obstructive pulmonary disease) ([place])   . Coronary artery disease   . Depression   .  Dyspnea   . Insomnia   . Myocardial infarct ([place])   . Reflux     Past Surgical History:  Procedure Laterality Date  . ABDOMINAL HYSTERECTOMY    . ANKLE SURGERY    . CHOLECYSTECTOMY    . ESOPHAGOGASTRODUODENOSCOPY N/A 01/15/2017   Procedure: ESOPHAGOGASTRODUODENOSCOPY (EGD);  Surgeon: Mauri Pole, MD;  Location: [facility] ENDOSCOPY;  Service: Endoscopy;  Laterality: N/A;  . HIATAL HERNIA REPAIR    . HIP FRACTURE SURGERY      Family History  Problem Relation Age of Onset  . Heart failure Mother   . Diabetes Mother     Social History:  reports that she has never smoked. She has never used smokeless tobacco. She reports that she does not drink alcohol or use drugs.  Allergies:  Allergies  Allergen Reactions  . Ambien [Zolpidem Tartrate] Other (See Comments)    Hallucinate   . Ativan [Lorazepam] Other (See Comments)    Hallucinations   . Lactose Intolerance (Gi)   . Other     Base metal and something else    Medications:  Prior to Admission:  Prescriptions Prior to Admission  Medication Sig Dispense Refill Last Dose  . aspirin EC 81 MG tablet Take 81 mg by mouth daily.   01/13/2017 at Unknown time  . carvedilol (COREG) 12.5 MG tablet Take 12.5 mg by mouth 2 (two) times daily with a meal.   01/13/2017 at 1800  . ergocalciferol (VITAMIN D2) 50000 units capsule Take 50,000 Units by mouth once a week. Take on Tuesdays   Past Week at Unknown time  . furosemide (LASIX) 20 MG tablet Take 1 tablet (20 mg total) by mouth daily. 30 tablet 0 01/13/2017 at Unknown time  . HYDROcodone-acetaminophen (NORCO) 7.5-325 MG per tablet Take 1 tablet by mouth every 4 (four) hours.    01/13/2017 at Unknown time  . nitroGLYCERIN (NITROSTAT) 0.4 MG SL tablet Place 0.4 mg under the tongue every 5 (five) minutes as needed. For chest pain   unknown  . omeprazole (PRILOSEC) 40 MG capsule Take 40 mg by mouth daily.   01/13/2017 at Unknown time  . PROAIR HFA 108 (90 Base) MCG/ACT inhaler Inhale 1-2 puffs into the  lungs every 6 (six) hours as needed for wheezing.   1 01/13/2017 at Unknown time  . sertraline (ZOLOFT) 100 MG tablet Take 200 mg by mouth daily.    01/13/2017 at Unknown time  . traZODone (DESYREL) 50 MG tablet Take 75 mg by mouth at bedtime.   01/12/2017 at Unknown time  . albuterol (PROVENTIL) (5 MG/ML) 0.5% nebulizer solution Take 0.5 mLs (2.5 mg total) by nebulization 3 (three) times daily as needed for wheezing. (  Patient not taking: Reported on 01/14/2017) 20 mL 1 Not Taking at Unknown time  . feeding supplement (BOOST / RESOURCE BREEZE) LIQD Take 1 Container by mouth 3 (three) times daily between meals. (Patient not taking: Reported on 01/14/2017)   Not Taking at Unknown time  . ipratropium (ATROVENT) 0.02 % nebulizer solution Take 2.5 mLs (0.5 mg total) by nebulization 3 (three) times daily as needed for wheezing. (Patient not taking: Reported on 01/14/2017) 75 mL 0 Not Taking at Unknown time  . Multiple Vitamin (MULTIVITAMIN WITH MINERALS) TABS Take 1 tablet by mouth daily. (Patient not taking: Reported on 01/14/2017)   Not Taking at Unknown time   Scheduled: . cyanocobalamin  1,000 mcg Subcutaneous Daily  . feeding supplement (ENSURE ENLIVE)  237 mL Oral TID BM  . multivitamin with minerals  1 tablet Oral Daily  . nystatin   Topical q morning - 10a  . pantoprazole  40 mg Oral BID  . sucralfate  1 g Oral TID WC & HS  . [START ON 01/17/2017] thiamine  100 mg Oral Daily   Continuous:  Anti-infectives    None      Results for orders placed or performed during the hospital encounter of 01/14/17 (from the past 48 hour(s))  Hemoglobin and hematocrit, blood     Status: Abnormal   Collection Time: 01/14/17  3:54 PM  Result Value Ref Range   Hemoglobin 8.7 (L) 12.0 - 15.0 g/dL   HCT 32.2 (L) 36.0 - 46.0 %  CBC     Status: Abnormal   Collection Time: 01/15/17  6:35 AM  Result Value Ref Range   WBC 10.8 (H) 4.0 - 10.5 K/uL   RBC 3.97 3.87 - 5.11 MIL/uL   Hemoglobin 9.0 (L) 12.0 - 15.0 g/dL   HCT  34.0 (L) 36.0 - 46.0 %   MCV 85.6 78.0 - 100.0 fL   MCH 22.7 (L) 26.0 - 34.0 pg   MCHC 26.5 (L) 30.0 - 36.0 g/dL   RDW 18.0 (H) 11.5 - 15.5 %   Platelets 166 150 - 400 K/uL  Comprehensive metabolic panel     Status: Abnormal   Collection Time: 01/15/17  6:35 AM  Result Value Ref Range   Sodium 141 135 - 145 mmol/L   Potassium 3.7 3.5 - 5.1 mmol/L   Chloride 105 101 - 111 mmol/L   CO2 26 22 - 32 mmol/L   Glucose, Bld 107 (H) 65 - 99 mg/dL   BUN 35 (H) 6 - 20 mg/dL   Creatinine, Ser 0.99 0.44 - 1.00 mg/dL   Calcium 8.3 (L) 8.9 - 10.3 mg/dL   Total Protein 5.1 (L) 6.5 - 8.1 g/dL   Albumin 3.0 (L) 3.5 - 5.0 g/dL   AST 32 15 - 41 U/L   ALT 15 14 - 54 U/L   Alkaline Phosphatase 38 38 - 126 U/L   Total Bilirubin 0.9 0.3 - 1.2 mg/dL   GFR calc non Af Amer 54 (L) >60 mL/min   GFR calc Af Amer >60 >60 mL/min    Comment: (NOTE) The eGFR has been calculated using the CKD EPI equation. This calculation has not been validated in all clinical situations. eGFR's persistently <60 mL/min signify possible Chronic Kidney Disease.    Anion gap 10 5 - 15  Ammonia     Status: None   Collection Time: 01/15/17 11:26 AM  Result Value Ref Range   Ammonia 26 9 - 35 umol/L  TSH     Status:  None   Collection Time: 01/15/17 11:26 AM  Result Value Ref Range   TSH 1.400 0.350 - 4.500 uIU/mL    Comment: Performed by a 3rd Generation assay with a functional sensitivity of <=0.01 uIU/mL.  T4, free     Status: None   Collection Time: 01/15/17 11:26 AM  Result Value Ref Range   Free T4 0.61 0.61 - 1.12 ng/dL    Comment: (NOTE) Biotin ingestion may interfere with free T4 tests. If the results are inconsistent with the TSH level, previous test results, or the clinical presentation, then consider biotin interference. If needed, order repeat testing after stopping biotin.   CBC with Differential/Platelet     Status: Abnormal   Collection Time: 01/16/17  5:01 AM  Result Value Ref Range   WBC 10.2 4.0 -  10.5 K/uL   RBC 3.95 3.87 - 5.11 MIL/uL   Hemoglobin 9.0 (L) 12.0 - 15.0 g/dL   HCT 34.7 (L) 36.0 - 46.0 %   MCV 87.8 78.0 - 100.0 fL   MCH 22.8 (L) 26.0 - 34.0 pg   MCHC 25.9 (L) 30.0 - 36.0 g/dL   RDW 18.0 (H) 11.5 - 15.5 %   Platelets 154 150 - 400 K/uL   Neutrophils Relative % 78 %   Neutro Abs 7.9 (H) 1.7 - 7.7 K/uL   Lymphocytes Relative 9 %   Lymphs Abs 0.9 0.7 - 4.0 K/uL   Monocytes Relative 13 %   Monocytes Absolute 1.4 (H) 0.1 - 1.0 K/uL   Eosinophils Relative 0 %   Eosinophils Absolute 0.0 0.0 - 0.7 K/uL   Basophils Relative 0 %   Basophils Absolute 0.0 0.0 - 0.1 K/uL  Magnesium     Status: None   Collection Time: 01/16/17  5:01 AM  Result Value Ref Range   Magnesium 1.8 1.7 - 2.4 mg/dL  Renal function panel     Status: Abnormal   Collection Time: 01/16/17  5:01 AM  Result Value Ref Range   Sodium 139 135 - 145 mmol/L   Potassium 3.6 3.5 - 5.1 mmol/L   Chloride 101 101 - 111 mmol/L   CO2 30 22 - 32 mmol/L   Glucose, Bld 151 (H) 65 - 99 mg/dL   BUN 28 (H) 6 - 20 mg/dL   Creatinine, Ser 0.70 0.44 - 1.00 mg/dL   Calcium 8.5 (L) 8.9 - 10.3 mg/dL   Phosphorus 2.2 (L) 2.5 - 4.6 mg/dL   Albumin 3.1 (L) 3.5 - 5.0 g/dL   GFR calc non Af Amer >60 >60 mL/min   GFR calc Af Amer >60 >60 mL/min    Comment: (NOTE) The eGFR has been calculated using the CKD EPI equation. This calculation has not been validated in all clinical situations. eGFR's persistently <60 mL/min signify possible Chronic Kidney Disease.    Anion gap 8 5 - 15    Ct Abdomen Pelvis Wo Contrast  Result Date: 01/15/2017 CLINICAL DATA:  Nausea and vomiting. Bilateral pleural effusion. Abnormal weight loss. EXAM: CT CHEST, ABDOMEN AND PELVIS WITHOUT CONTRAST TECHNIQUE: Multidetector CT imaging of the chest, abdomen and pelvis was performed following the standard protocol without IV contrast. COMPARISON:  Radiographs yesterday. FINDINGS: Exaggerated kyphosis distorts normal anatomy. CT CHEST FINDINGS  Cardiovascular: Atherosclerosis and tortuosity of the thoracic aorta. There are coronary artery calcifications. The heart is normal in size, displaced anteriorly. No evidence pericardial effusion. Mediastinum/Nodes: Normal diaphoretic contours not well visualized, there is herniation of abdominal contents within the lower thorax, including small bowel  and portions of colon. Limited assessment for adenopathy given the distortion of normal anatomy. There is enteric contrast within the esophagus. The gastroesophageal junction and stomach are not well-defined. Lungs/Pleura: Compressive atelectasis in the lower lungs secondary to herniation of intra-abdominal contents. Near complete atelectasis of the right lower and right middle lobes. Near complete atelectasis of the left lower lobe, with minimal superior segment aerated. There are small bilateral pleural effusions, right greater than left. Upper lungs are clear. Musculoskeletal: Extensive exaggerated thoracic kyphosis. Diffuse bony under mineralization. Mild compression fracture of T8. No lytic or blastic osseous lesions. CT ABDOMEN PELVIS FINDINGS Hepatobiliary: Postcholecystectomy. No evidence of focal hepatic lesion allowing for lack contrast. Common bile duct is not well-defined. Pancreas: Not well visualized, may be in part intrathoracic. Spleen: Normal in size without focal abnormality. Adrenals/Urinary Tract: 5 mm nonobstructing stone in the left kidney. No hydronephrosis. Adrenal glands are not well visualized. There is a surgical clip at the level of the left renal vein. Urinary bladder is physiologically distended, no wall thickening. Stomach/Bowel: Normal bowel anatomy is distorted. Majority of the small bowel appears intrathoracic. Portions of the transverse and descending colon are intrathoracic. There is evidence of colonic wall thickening in the central abdomen, likely the ascending colon which is displaced centrally. The appendix is not confidently  visualized. No bowel obstruction, enteric contrast progresses to the sigmoid. Vascular/Lymphatic: Atherosclerosis of the abdominal aorta. Diminish sensitivity for adenopathy given lack of contrast and paucity of intra-abdominal fat. No gross bulky adenopathy. Reproductive: Reported hysterectomy, uterus is not visualized. Other: Small volume of intra-abdominal and pelvic ascites. There is whole body wall edema. Musculoskeletal: Diffuse bony under mineralization. Compression fracture of L1 with 75% loss of height centrally and retropulsion of posterior cortex. Moderate compression fracture of L5 with approximately 50% vertebral body height loss. Milder compression fractures of L2, L4 and possibly L3. Postsurgical change in the left hip. IMPRESSION: 1. Normal diaphragmatic contours are not well-defined, there is herniation of majority of small bowel and portions of the colon into the lower thorax, likely through a large hernia at the aortic hiatus. No obstruction. 2. Associated atelectasis at the lung bases with near complete atelectasis/compression of the right middle and both lower lobes secondary to herniated bowel. Small bilateral pleural effusions. 3. Colonic wall thickening in the central abdomen, likely displaced ascending colon, suspicious for colitis. 4. Small amount of abdominopelvic ascites. Whole body wall edema. Findings suggest third-spacing. 5. Diffuse osteoporosis with multiple compression fractures of the thoracolumbar spine, most significant at L1. 6. Technically difficult evaluation due to exaggerated thoracic kyphosis and distortion of normal anatomy. Electronically Signed   By: Jeb Levering M.D.   On: 01/15/2017 22:20   Ct Chest Wo Contrast  Result Date: 01/15/2017 CLINICAL DATA:  Nausea and vomiting. Bilateral pleural effusion. Abnormal weight loss. EXAM: CT CHEST, ABDOMEN AND PELVIS WITHOUT CONTRAST TECHNIQUE: Multidetector CT imaging of the chest, abdomen and pelvis was performed  following the standard protocol without IV contrast. COMPARISON:  Radiographs yesterday. FINDINGS: Exaggerated kyphosis distorts normal anatomy. CT CHEST FINDINGS Cardiovascular: Atherosclerosis and tortuosity of the thoracic aorta. There are coronary artery calcifications. The heart is normal in size, displaced anteriorly. No evidence pericardial effusion. Mediastinum/Nodes: Normal diaphoretic contours not well visualized, there is herniation of abdominal contents within the lower thorax, including small bowel and portions of colon. Limited assessment for adenopathy given the distortion of normal anatomy. There is enteric contrast within the esophagus. The gastroesophageal junction and stomach are not well-defined. Lungs/Pleura: Compressive atelectasis in  the lower lungs secondary to herniation of intra-abdominal contents. Near complete atelectasis of the right lower and right middle lobes. Near complete atelectasis of the left lower lobe, with minimal superior segment aerated. There are small bilateral pleural effusions, right greater than left. Upper lungs are clear. Musculoskeletal: Extensive exaggerated thoracic kyphosis. Diffuse bony under mineralization. Mild compression fracture of T8. No lytic or blastic osseous lesions. CT ABDOMEN PELVIS FINDINGS Hepatobiliary: Postcholecystectomy. No evidence of focal hepatic lesion allowing for lack contrast. Common bile duct is not well-defined. Pancreas: Not well visualized, may be in part intrathoracic. Spleen: Normal in size without focal abnormality. Adrenals/Urinary Tract: 5 mm nonobstructing stone in the left kidney. No hydronephrosis. Adrenal glands are not well visualized. There is a surgical clip at the level of the left renal vein. Urinary bladder is physiologically distended, no wall thickening. Stomach/Bowel: Normal bowel anatomy is distorted. Majority of the small bowel appears intrathoracic. Portions of the transverse and descending colon are  intrathoracic. There is evidence of colonic wall thickening in the central abdomen, likely the ascending colon which is displaced centrally. The appendix is not confidently visualized. No bowel obstruction, enteric contrast progresses to the sigmoid. Vascular/Lymphatic: Atherosclerosis of the abdominal aorta. Diminish sensitivity for adenopathy given lack of contrast and paucity of intra-abdominal fat. No gross bulky adenopathy. Reproductive: Reported hysterectomy, uterus is not visualized. Other: Small volume of intra-abdominal and pelvic ascites. There is whole body wall edema. Musculoskeletal: Diffuse bony under mineralization. Compression fracture of L1 with 75% loss of height centrally and retropulsion of posterior cortex. Moderate compression fracture of L5 with approximately 50% vertebral body height loss. Milder compression fractures of L2, L4 and possibly L3. Postsurgical change in the left hip. IMPRESSION: 1. Normal diaphragmatic contours are not well-defined, there is herniation of majority of small bowel and portions of the colon into the lower thorax, likely through a large hernia at the aortic hiatus. No obstruction. 2. Associated atelectasis at the lung bases with near complete atelectasis/compression of the right middle and both lower lobes secondary to herniated bowel. Small bilateral pleural effusions. 3. Colonic wall thickening in the central abdomen, likely displaced ascending colon, suspicious for colitis. 4. Small amount of abdominopelvic ascites. Whole body wall edema. Findings suggest third-spacing. 5. Diffuse osteoporosis with multiple compression fractures of the thoracolumbar spine, most significant at L1. 6. Technically difficult evaluation due to exaggerated thoracic kyphosis and distortion of normal anatomy. Electronically Signed   By: Jeb Levering M.D.   On: 01/15/2017 22:20    Review of Systems  Constitutional: Positive for weight loss.  HENT: Positive for hearing loss.    Eyes:       Not sure how well she sees, she does seem to watch TV.  Respiratory: Positive for shortness of breath. Negative for cough, hemoptysis, sputum production and wheezing.        On home O2  Cardiovascular: Positive for orthopnea. Negative for chest pain, palpitations, claudication, leg swelling and PND.  Gastrointestinal: Positive for constipation (she has large BM's about every 2 days, if not she ask for suppository and that seems to work for her.), heartburn and nausea. Negative for abdominal pain, blood in stool, diarrhea, melena and vomiting.  Genitourinary: Negative.   Musculoskeletal: Positive for back pain and neck pain.  Skin: Negative.   Neurological: Positive for weakness.       Changes in [place] brought her here.  She sometimes does not respond.  Looks at you but does not seem to see you or acknowledge family  there.  If you ask her a question it might take 10 minutes to get an answer.  Endo/Heme/Allergies: Negative for environmental allergies and polydipsia. Does not bruise/bleed easily.  Psychiatric/Behavioral:       Not really able to tell   Blood pressure (!) 114/46, pulse 89, temperature 98.1 F (36.7 C), temperature source Oral, resp. rate 16, height 5' (1.524 m), weight 34.9 kg (77 lb), SpO2 95 %. Physical Exam  Vitals reviewed. Constitutional:  Thin cachectic woman with severe Kyphosis.  When I came in she did not speak or acknowledge me. She let her son go thru her whole hx before trying to talk with me.  She thinks the year is 10.  No idea who president is.  Know she is at [facility], but not sure why she came.    HENT:  Head: Normocephalic and atraumatic.  Mouth/Throat: No oropharyngeal exudate.  Eyes: Right eye exhibits no discharge. Left eye exhibits no discharge. No scleral icterus.  Pupils are equal  Neck: Neck supple. No JVD present. No tracheal deviation present. No thyromegaly present.  Cardiovascular: Normal rate, regular rhythm, normal heart sounds  and intact distal pulses.   No murmur heard. Respiratory: Effort normal and breath sounds normal. No respiratory distress. She has no wheezes. She has no rales. She exhibits no tenderness.  She has significant Kyphosis. She is very hunched over from this.  BS down in bases  GI: Soft. Bowel sounds are normal. She exhibits no distension and no mass. There is no tenderness. There is no rebound and no guarding.  She has surgical scars upper thorax and mid abdomen  Musculoskeletal: She exhibits no edema or tenderness.  Neurological: She is alert. No cranial nerve deficit.  Skin: Skin is warm and dry. No rash noted. No erythema. No pallor.  Psychiatric:  She is in a good mood, She has poor memory, not sure about thoughts, or  Judgement.     Assessment/Plan: S/p multiple surgeries with Hiatal hernia since at least 2009 CT shows herniation of small bowel/colon into thoracic cavity  Compression of both lower lobes, and RML secondary to herniation Severe kyphosis/multiple compression fractures Erosive esophagitis Encephalopathy/AMS  Severe Malnutrition Body mass index is 15. Acute kidney injury COPD/Home O2 Hx of DCHF  Plan:  Will review with Dr. Hulen Skains, but she is clearly a very poor candidate for surgery.     Verlee Pope 01/16/2017, 12:37 PM

## 2017-01-16 NOTE — Progress Notes (Signed)
Daily Rounding Note  01/16/2017, 9:38 AM  LOS: 2 days   SUBJECTIVE:   Chief complaint: chronic abd pain    Tolerated most of her clear liquid breakfast.    OBJECTIVE:         Vital signs in last 24 hours:    Temp:  [97.4 F (36.3 C)-98.2 F (36.8 C)] 98.1 F (36.7 C) (06/07 0748) Pulse Rate:  [89-101] 89 (06/07 0748) Resp:  [12-25] 16 (06/07 0748) BP: (100-147)/(42-68) 114/46 (06/07 0748) SpO2:  [92 %-97 %] 95 % (06/07 0748) Weight:  [34.9 kg (77 lb)] 34.9 kg (77 lb) (06/06 2129) Last BM Date: 01/13/17 Filed Weights   01/13/17 2226 01/14/17 1700 01/15/17 2129  Weight: 31.8 kg (70 lb) 34 kg (74 lb 14.4 oz) 34.9 kg (77 lb)   General: cachectic, pale   Heart: RRR Chest: clear bil no labored breathing but + cough Abdomen: thin, tender on rUQ, not focal, no guard or rebound.    Extremities: no CCE, muscle wasting Neuro/Psych:  Oriented x 3.  Moves all limbs.    Intake/Output from previous day: 06/06 0701 - 06/07 0700 In: 420 [P.O.:120; I.V.:300] Out: 200 [Urine:200]  Intake/Output this shift: Total I/O In: 240 [P.O.:240] Out: 0   Lab Results:  Recent Labs  01/14/17 0538 01/14/17 1554 01/15/17 0635 01/16/17 0501  WBC 11.3*  --  10.8* 10.2  HGB 9.7* 8.7* 9.0* 9.0*  HCT 35.6* 32.2* 34.0* 34.7*  PLT 155  --  166 154   BMET  Recent Labs  01/14/17 0538 01/15/17 0635 01/16/17 0501  NA 139 141 139  K 4.3 3.7 3.6  CL 98* 105 101  CO2 25 26 30   GLUCOSE 107* 107* 151*  BUN 42* 35* 28*  CREATININE 1.44* 0.99 0.70  CALCIUM 8.7* 8.3* 8.5*   LFT  Recent Labs  01/13/17 2238 01/15/17 0635 01/16/17 0501  PROT 4.9* 5.1*  --   ALBUMIN 3.0* 3.0* 3.1*  AST 35 32  --   ALT 16 15  --   ALKPHOS 37* 38  --   BILITOT 1.4* 0.9  --    PT/INR  Recent Labs  01/14/17 0117  LABPROT 15.9*  INR 1.26   Hepatitis Panel No results for input(s): HEPBSAG, HCVAB, HEPAIGM, HEPBIGM in the last 72  hours.  Studies/Results: Ct Abdomen Pelvis Wo Contrast  Result Date: 01/15/2017 CLINICAL DATA:  Nausea and vomiting. Bilateral pleural effusion. Abnormal weight loss. EXAM: CT CHEST, ABDOMEN AND PELVIS WITHOUT CONTRAST TECHNIQUE: Multidetector CT imaging of the chest, abdomen and pelvis was performed following the standard protocol without IV contrast. COMPARISON:  Radiographs yesterday. FINDINGS: Exaggerated kyphosis distorts normal anatomy. CT CHEST FINDINGS Cardiovascular: Atherosclerosis and tortuosity of the thoracic aorta. There are coronary artery calcifications. The heart is normal in size, displaced anteriorly. No evidence pericardial effusion. Mediastinum/Nodes: Normal diaphoretic contours not well visualized, there is herniation of abdominal contents within the lower thorax, including small bowel and portions of colon. Limited assessment for adenopathy given the distortion of normal anatomy. There is enteric contrast within the esophagus. The gastroesophageal junction and stomach are not well-defined. Lungs/Pleura: Compressive atelectasis in the lower lungs secondary to herniation of intra-abdominal contents. Near complete atelectasis of the right lower and right middle lobes. Near complete atelectasis of the left lower lobe, with minimal superior segment aerated. There are small bilateral pleural effusions, right greater than left. Upper lungs are clear. Musculoskeletal: Extensive exaggerated thoracic kyphosis. Diffuse bony under mineralization.  Mild compression fracture of T8. No lytic or blastic osseous lesions. CT ABDOMEN PELVIS FINDINGS Hepatobiliary: Postcholecystectomy. No evidence of focal hepatic lesion allowing for lack contrast. Common bile duct is not well-defined. Pancreas: Not well visualized, may be in part intrathoracic. Spleen: Normal in size without focal abnormality. Adrenals/Urinary Tract: 5 mm nonobstructing stone in the left kidney. No hydronephrosis. Adrenal glands are not well  visualized. There is a surgical clip at the level of the left renal vein. Urinary bladder is physiologically distended, no wall thickening. Stomach/Bowel: Normal bowel anatomy is distorted. Majority of the small bowel appears intrathoracic. Portions of the transverse and descending colon are intrathoracic. There is evidence of colonic wall thickening in the central abdomen, likely the ascending colon which is displaced centrally. The appendix is not confidently visualized. No bowel obstruction, enteric contrast progresses to the sigmoid. Vascular/Lymphatic: Atherosclerosis of the abdominal aorta. Diminish sensitivity for adenopathy given lack of contrast and paucity of intra-abdominal fat. No gross bulky adenopathy. Reproductive: Reported hysterectomy, uterus is not visualized. Other: Small volume of intra-abdominal and pelvic ascites. There is whole body wall edema. Musculoskeletal: Diffuse bony under mineralization. Compression fracture of L1 with 75% loss of height centrally and retropulsion of posterior cortex. Moderate compression fracture of L5 with approximately 50% vertebral body height loss. Milder compression fractures of L2, L4 and possibly L3. Postsurgical change in the left hip. IMPRESSION: 1. Normal diaphragmatic contours are not well-defined, there is herniation of majority of small bowel and portions of the colon into the lower thorax, likely through a large hernia at the aortic hiatus. No obstruction. 2. Associated atelectasis at the lung bases with near complete atelectasis/compression of the right middle and both lower lobes secondary to herniated bowel. Small bilateral pleural effusions. 3. Colonic wall thickening in the central abdomen, likely displaced ascending colon, suspicious for colitis. 4. Small amount of abdominopelvic ascites. Whole body wall edema. Findings suggest third-spacing. 5. Diffuse osteoporosis with multiple compression fractures of the thoracolumbar spine, most significant at  L1. 6. Technically difficult evaluation due to exaggerated thoracic kyphosis and distortion of normal anatomy. Electronically Signed   By: Rubye Oaks M.D.   On: 01/15/2017 22:20   Ct Chest Wo Contrast  Result Date: 01/15/2017 CLINICAL DATA:  Nausea and vomiting. Bilateral pleural effusion. Abnormal weight loss. EXAM: CT CHEST, ABDOMEN AND PELVIS WITHOUT CONTRAST TECHNIQUE: Multidetector CT imaging of the chest, abdomen and pelvis was performed following the standard protocol without IV contrast. COMPARISON:  Radiographs yesterday. FINDINGS: Exaggerated kyphosis distorts normal anatomy. CT CHEST FINDINGS Cardiovascular: Atherosclerosis and tortuosity of the thoracic aorta. There are coronary artery calcifications. The heart is normal in size, displaced anteriorly. No evidence pericardial effusion. Mediastinum/Nodes: Normal diaphoretic contours not well visualized, there is herniation of abdominal contents within the lower thorax, including small bowel and portions of colon. Limited assessment for adenopathy given the distortion of normal anatomy. There is enteric contrast within the esophagus. The gastroesophageal junction and stomach are not well-defined. Lungs/Pleura: Compressive atelectasis in the lower lungs secondary to herniation of intra-abdominal contents. Near complete atelectasis of the right lower and right middle lobes. Near complete atelectasis of the left lower lobe, with minimal superior segment aerated. There are small bilateral pleural effusions, right greater than left. Upper lungs are clear. Musculoskeletal: Extensive exaggerated thoracic kyphosis. Diffuse bony under mineralization. Mild compression fracture of T8. No lytic or blastic osseous lesions. CT ABDOMEN PELVIS FINDINGS Hepatobiliary: Postcholecystectomy. No evidence of focal hepatic lesion allowing for lack contrast. Common bile duct is not well-defined.  Pancreas: Not well visualized, may be in part intrathoracic. Spleen: Normal  in size without focal abnormality. Adrenals/Urinary Tract: 5 mm nonobstructing stone in the left kidney. No hydronephrosis. Adrenal glands are not well visualized. There is a surgical clip at the level of the left renal vein. Urinary bladder is physiologically distended, no wall thickening. Stomach/Bowel: Normal bowel anatomy is distorted. Majority of the small bowel appears intrathoracic. Portions of the transverse and descending colon are intrathoracic. There is evidence of colonic wall thickening in the central abdomen, likely the ascending colon which is displaced centrally. The appendix is not confidently visualized. No bowel obstruction, enteric contrast progresses to the sigmoid. Vascular/Lymphatic: Atherosclerosis of the abdominal aorta. Diminish sensitivity for adenopathy given lack of contrast and paucity of intra-abdominal fat. No gross bulky adenopathy. Reproductive: Reported hysterectomy, uterus is not visualized. Other: Small volume of intra-abdominal and pelvic ascites. There is whole body wall edema. Musculoskeletal: Diffuse bony under mineralization. Compression fracture of L1 with 75% loss of height centrally and retropulsion of posterior cortex. Moderate compression fracture of L5 with approximately 50% vertebral body height loss. Milder compression fractures of L2, L4 and possibly L3. Postsurgical change in the left hip. IMPRESSION: 1. Normal diaphragmatic contours are not well-defined, there is herniation of majority of small bowel and portions of the colon into the lower thorax, likely through a large hernia at the aortic hiatus. No obstruction. 2. Associated atelectasis at the lung bases with near complete atelectasis/compression of the right middle and both lower lobes secondary to herniated bowel. Small bilateral pleural effusions. 3. Colonic wall thickening in the central abdomen, likely displaced ascending colon, suspicious for colitis. 4. Small amount of abdominopelvic ascites. Whole body  wall edema. Findings suggest third-spacing. 5. Diffuse osteoporosis with multiple compression fractures of the thoracolumbar spine, most significant at L1. 6. Technically difficult evaluation due to exaggerated thoracic kyphosis and distortion of normal anatomy. Electronically Signed   By: Rubye Oaks M.D.   On: 01/15/2017 22:20   Scheduled Meds: . cyanocobalamin  1,000 mcg Subcutaneous Daily  . feeding supplement  1 Container Oral TID BM  . multivitamin with minerals  1 tablet Oral Daily  . nystatin   Topical q morning - 10a  . pantoprazole  40 mg Intravenous Q12H  . sucralfate  1 g Oral TID WC & HS  . thiamine injection  100 mg Intravenous Daily   Continuous Infusions: PRN Meds:.acetaminophen **OR** acetaminophen, ipratropium-albuterol, ondansetron **OR** ondansetron (ZOFRAN) IV, traMADol  ASSESMENT:   *  Normocytic anemia EGD 6/6 with severe erosive gastritis.  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   Carafate Protonix in place.  CT scan with extensive HH, involving SB and part of colon.       PLAN   *  Dr Lavon Paganini had planned colonoscopy, however with the severe HH this will not be possible  *  Awaiting family meeting with Pall care.    *  At Dr Elana Alm request, called for surgical consult made re: potential for [facility] repair and ? Feeding J tube.  *  Advance from clears to D1 diet    Laurie Sparks  01/16/2017, 9:38 AM Pager: [phone]   Attending physician's note   I have taken an interval history, reviewed the chart and examined the patient. I agree with the Advanced Practitioner's note, impression and recommendations.  I reviewed images of CT chest abdomen and pelvis, large diaphragmatic hernia with herniation of abdominal contents including small bowel and portion of colon. Patient is failing to  thrive with severe protein allergy malnutrition secondary to persistent regurgitation, vomiting and intermittent dysphagia. EGD showed severe erosive  esophagitis and evidence of prior surgery, subtotal gastrectomy with gastroduodenal ostomy. Requested surgery team to evaluate and see if she can be a candidate for placement of small bowel or gastric feeding tube to keep/tack the intestines to abdominal wall, prevent migration into the thoracic cavity and also help with nutrition Advance diet as tolerated Nutrition consult for calorie count Palliative care consult to discuss goals of care Please call with any questions  K Scherry Ran, MD [phone] Mon-Fri 8a-5p [phone] after 5p, weekends, holidays

## 2017-01-16 NOTE — Progress Notes (Signed)
Nutrition Follow-up  DOCUMENTATION CODES:   Severe malnutrition in context of chronic illness, Underweight  INTERVENTION:   -D/C Boost Breeze -Order Ensure Enlive po TID, each supplement provides 350 kcal and 20 grams of protein -Await further decision regarding poc. 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  Continues, diet advanced, supplement modified, possible feeding tube  GOAL:   Patient will meet greater than or equal to 90% of their needs  Progressing  MONITOR:   PO intake, Supplement acceptance, Labs, Weight trends  REASON FOR ASSESSMENT:   Malnutrition Screening Tool    ASSESSMENT:   76 yo female admitted with FTT, progressive weakness, increased lethargy, not eating or drinking. Pt with hx of COPD on home oxygen (3L), CHF, CAD, GERD.  Pt with hx of chronic abdominal pain and nausea, vomiting after meals. Noted pt also with large hiatal hernia containing stomach  6/6 EGD with moderate/severe erosive esophagitis (biopsied). 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 Surgical consult for possible hernia repair and ? G-tube or J-tube  Tolerated CL breakfast this AM  Diet Order:  DIET - DYS 1 Room service appropriate? Yes; Fluid consistency: Thin  Skin:  Reviewed, no issues  Last BM:  01/13/17  Height:   Ht Readings from Last 1 Encounters:  01/14/17 5' (1.524 m)    Weight:   Wt Readings from Last 1 Encounters:  01/15/17 77 lb (34.9 kg)    Ideal Body Weight:  47.7 kg  BMI:  Body mass index is 15.04 kg/m.  Estimated  Nutritional Needs:   Kcal:  1100-1300 kcals  Protein:  55-65 g  Fluid:  >/= 1.1 L  EDUCATION NEEDS:   No education needs identified at this time  Romelle StarcherCate Charlee Whitebread MS, RD, LDN [phone] Pager  [phone] Weekend/On-Call Pager

## 2017-01-17 LAB — CBC
HCT: 30.6 % — ABNORMAL LOW (ref 36.0–46.0)
Hemoglobin: 8.1 g/dL — ABNORMAL LOW (ref 12.0–15.0)
MCH: 22.4 pg — AB (ref 26.0–34.0)
MCHC: 26.5 g/dL — ABNORMAL LOW (ref 30.0–36.0)
MCV: 84.8 fL (ref 78.0–100.0)
PLATELETS: 139 10*3/uL — AB (ref 150–400)
RBC: 3.61 MIL/uL — ABNORMAL LOW (ref 3.87–5.11)
RDW: 18 % — AB (ref 11.5–15.5)
WBC: 7 10*3/uL (ref 4.0–10.5)

## 2017-01-17 LAB — RENAL FUNCTION PANEL
Albumin: 3.1 g/dL — ABNORMAL LOW (ref 3.5–5.0)
Anion gap: 6 (ref 5–15)
BUN: 18 mg/dL (ref 6–20)
CALCIUM: 9.1 mg/dL (ref 8.9–10.3)
CHLORIDE: 101 mmol/L (ref 101–111)
CO2: 34 mmol/L — ABNORMAL HIGH (ref 22–32)
Creatinine, Ser: 0.53 mg/dL (ref 0.44–1.00)
GFR calc Af Amer: >60 mL/min (ref >60)
Glucose, Bld: 120 mg/dL — ABNORMAL HIGH (ref 65–99)
Phosphorus: <1 mg/dL — CL (ref 2.5–4.6)
Potassium: 3 mmol/L — ABNORMAL LOW (ref 3.5–5.1)
SODIUM: 141 mmol/L (ref 135–145)

## 2017-01-17 MED ORDER — K PHOS MONO-SOD PHOS DI & MONO 155-852-130 MG PO TABS
500.0000 mg | ORAL_TABLET | Freq: Three times a day (TID) | ORAL | Status: DC
  Administered 2017-01-17 – 2017-01-18 (×5): 500 mg via ORAL
  Filled 2017-01-17 (×7): qty 2

## 2017-01-17 MED ORDER — POTASSIUM CHLORIDE 20 MEQ/15ML (10%) PO SOLN
40.0000 meq | Freq: Once | ORAL | Status: AC
  Administered 2017-01-17: 40 meq via ORAL
  Filled 2017-01-17: qty 30

## 2017-01-17 NOTE — Progress Notes (Signed)
PT Cancellation Note  Patient Details Name: Audelia HivesJoyce Wachs MRN: [id number] DOB: 1941/01/13   Cancelled Treatment:    Reason Eval/Treat Not Completed: Pain limiting ability to participate. Pt reports that she is in pain and wants to have her pain medication before she works with therapy.    Colin BroachSabra M. Makynli Stills PT, DPT  [phone]  01/17/2017, 10:26 AM

## 2017-01-17 NOTE — Progress Notes (Signed)
PT Cancellation Note  Patient Details Name: Laurie Sparks MRN: [id number] DOB: Mar 01, 1941   Cancelled Treatment:    Reason Eval/Treat Not Completed: Other (comment);Patient declined, no reason specified. Pt's is initially willing to work with therapy to transfer from bed to recliner. Pt's family repeats to her that she doesn't need to get up if she doesn't want to. Pt's family talks the patient out of participating with therapy this session. Pt continues to require SNF at discharge for bed outcomes due to recent functional status.    Colin BroachSabra M. Sydelle Sherfield PT, DPT  [phone]  01/17/2017, 3:13 PM

## 2017-01-17 NOTE — Care Management Note (Addendum)
Case Management Note  Patient Details  Name: Laurie Sparks MRN: [id number] Date of Birth: 23-Mar-1941  Subjective/Objective:    Failure to Thrive, COPD, AKI                Action/Plan: Discharge Planning: NCM spoke to pt, Dtr, Sylvie Farrierlexis Alexander HCPOA [phone] and son, Kathlene NovemberMike. Son is refusing HH and Hospice. Pt has oxygen at home. Spoke to dtr, [employer] privately. States she and her other brother has POA and [facility]. Explained the different services. Provided dtr with NCM contact number. She will speak to family and make a decision on Hospice vs HH. She has hospital bed but needs another mattress.   PCP Halina MaidensSTALLINGS, SHEILA C. MD  Expected Discharge Date:                 Expected Discharge Plan:  Home w Hospice Care  In-House Referral:  Clinical Social Work  Discharge planning Services  CM Consult  Post Acute Care Choice:  Home Health, Hospice Choice offered to:  Adult Children  DME Arranged:  N/A DME Agency:  NA  HH Arranged:  RN HH Agency:     Status of Service:  In process, will continue to follow  If discussed at Long Length of Stay Meetings, dates discussed:    Additional Comments:  Elliot CousinShavis, Aleaha Fickling Ellen, RN 01/17/2017, 4:49 PM

## 2017-01-17 NOTE — Progress Notes (Signed)
Palliative Medicine RN Note:  Called son Gabriel RungJoe to try to schedule follow up meeting. [facility] Confirmed that he will be here at 1000 to meet with PMT NP Sunday.  Margret ChanceMelanie G. Jeanifer Halliday, RN, BSN, [facility] 01/17/2017 10:21 AM Cell [phone] 8:00-4:00 Monday-Friday Office [phone]

## 2017-01-17 NOTE — Progress Notes (Addendum)
[facility] Progress Note  Patient: Laurie Sparks ZOX:[id number]   PCP: Halina Maidens., MD (Inactive) DOB: 06/08/1941   DOA: 01/14/2017   DOS: 01/17/2017   Date of Service: the patient was seen and examined on 01/17/2017  Subjective: No acute complaint. Patient more interactive when alone per nursing staff. No acute events overnight.  Brief hospital course: Pt. with PMH of severe COPD, chronic respiratory failure on 3 L of oxygen, COPD, chronic diastolic CHF, GERD, hiatal hernia; admitted on 01/14/2017, presented with complaint of poor oral intake, was found to have acute kidney injury, symptomatic anemia. Currently further plan is goals of care discussion.  Assessment and Plan: 1. Failure to thrive. Protein calorie malnutrition, severe. 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 Patient primarily presented with poor by mouth intake as well as lethargy. Appears persistently more lethargic. By mouth intake still limited although the patient is nothing by mouth for EGD. Dietary consulted, speech therapy consulted recommended to advance the diet as tolerated. While her son who is at bedside mentions that this has been fairly recent that she has not been eating anything for last 2 weeks it appears by the degree of malnutrition that this is a long-standing issue likely contributed from her chronic nausea and vomiting as well as large hiatal hernia and COPD. Palliative care consulted for further discussion on goals of care. Family meeting tomorrow scheduled with myself.  2. Acute kidney injury. Based on renal function and serum creatinine running around 0.5. Presented with serum creatinine of 1.56 and BUN of 41. Currently getting better This appears significant improvement with IV hydration. Lactic contributing to patient's lethargy as well as encephalopathy. We'll monitor. Gentle IV hydration will continue.  3. Acute encephalopathy. Currently resolved Per son the patient is acutely confused and at her  baseline is fairly active. I suspect the patient may have early signs of dementia which the family has not identified at home and currently appears to have delirium from acute kidney injury as well as poor by mouth intake. I will start the patient on multivitamins, B-12 subcutaneous, thiamine IV 1 followed by oral. Normal TSH and free T4. Normal ammonia.  4. COPD. Chronic respiratory failure. Chronic Bilateral pleural effusion. Patient has severe COPD at her baseline no evidence of acute exacerbation. Continue when necessary DuoNeb's and had scheduled dulera. Also has bilateral pleural effusion unchanged from an x-ray done a year ago. CT chest abdomen and pelvis shows no malignancy but shows that her small bowel is in her chest due to hernia.  5. Suspected GI bleed. H. pylori positive. Erosive esophagitis. Suspected acute blood loss anemia, more likely chronic anemia from nutritional deficiency. Patient presents with fatigue and lethargy. On admission her hemoglobin was 9.9, which is a significant drop from her baseline. No evidence of active bleeding reported per family. No evidence of active bleeding here in the hospital as well. GI was consulted, patient was started on IV Protonix drip. At present I would discontinue the IV Protonix drip and place the patient on every 12 hours Protonix. EGD shows erosive esophagitis. Since her biopsy is positive for H. pylori would start her on treatment.  6. Hiatal hernia. Appreciate surgical consultation requested by GI. Patient is not a candidate for any surgical intervention given her severe COPD as well as protein calorie malnutrition and undiagnosed dementia. Performing open jejunostomy is also not a safe option for this patient. We'll discuss with family regarding goals of care tomorrow.  7. Hypokalemia  Hypophosphatemia  Replacing, recheck tomorrow  Diet:  Dysphagia 1 aspiration precaution. DVT Prophylaxis: mechanical compression  device  Advance goals of care discussion: Full code, family meeting tomorrow. family at bedside  Family Communication: family was present at bedside, at the time of interview.  Disposition:  Discharge to be determine. Family refusing SNF  Consultants: Gastroenterology, palliative care, general surgery Procedures: EGD  Antibiotics: Anti-infectives    None       Objective: Physical Exam: Vitals:   01/16/17 2123 01/17/17 0516 01/17/17 0954 01/17/17 1730  BP: 114/61 121/60 (!) 122/56 134/62  Pulse: 88 87 90 88  Resp: 16 16 16 16   Temp: 98.6 F (37 C) 98.1 F (36.7 C) 97.5 F (36.4 C) 97.4 F (36.3 C)  TempSrc: Oral Oral Oral Oral  SpO2: 100% 100% 95% 100%  Weight: 35.5 kg (78 lb 4.2 oz)     Height:        Intake/Output Summary (Last 24 hours) at 01/17/17 1829 Last data filed at 01/17/17 1340  Gross per 24 hour  Intake              120 ml  Output                0 ml  Net              120 ml   Filed Weights   01/14/17 1700 01/15/17 2129 01/16/17 2123  Weight: 34 kg (74 lb 14.4 oz) 34.9 kg (77 lb) 35.5 kg (78 lb 4.2 oz)   General: Appear in mild distress, no Rash; Oral Mucosa moist. no Abnormal Mass Or lumps Cardiovascular: S1 and S2 Present, no Murmur, Respiratory: normal respiratory effort, Bilateral Air entry present and Clear to Auscultation, no Crackles, no wheezes Abdomen: Bowel Sound present, Soft and no tenderness, no hernia Extremities: no Pedal edema, no calf tenderness Neurology: Alert, Awake and Oriented to Time, Place and Person. affect flat. Grossly no focal neuro deficit.   Data Reviewed: CBC:  Recent Labs Lab 01/13/17 2238 01/14/17 0538 01/14/17 1554 01/15/17 0635 01/16/17 0501 01/17/17 0416  WBC 10.8* 11.3*  --  10.8* 10.2 7.0  NEUTROABS 9.1*  --   --   --  7.9*  --   HGB 9.9* 9.7* 8.7* 9.0* 9.0* 8.1*  HCT 36.5 35.6* 32.2* 34.0* 34.7* 30.6*  MCV 82.8 82.8  --  85.6 87.8 84.8  PLT 188 155  --  166 154 139*   Basic Metabolic  Panel:  Recent Labs Lab 01/13/17 2238 01/14/17 0538 01/15/17 0635 01/16/17 0501 01/17/17 0416  NA 138 139 141 139 141  K 4.4 4.3 3.7 3.6 3.0*  CL 98* 98* 105 101 101  CO2 26 25 26 30  34*  GLUCOSE 141* 107* 107* 151* 120*  BUN 41* 42* 35* 28* 18  CREATININE 1.56* 1.44* 0.99 0.70 0.53  CALCIUM 8.8* 8.7* 8.3* 8.5* 9.1  MG  --   --   --  1.8  --   PHOS  --   --   --  2.2* <1.0*    Liver Function Tests:  Recent Labs Lab 01/13/17 2238 01/15/17 0635 01/16/17 0501 01/17/17 0416  AST 35 32  --   --   ALT 16 15  --   --   ALKPHOS 37* 38  --   --   BILITOT 1.4* 0.9  --   --   PROT 4.9* 5.1*  --   --   ALBUMIN 3.0* 3.0* 3.1* 3.1*   No results for input(s): LIPASE, AMYLASE in the last  168 hours.  Recent Labs Lab 01/15/17 1126  AMMONIA 26   Coagulation Profile:  Recent Labs Lab 01/14/17 0117  INR 1.26   Studies: No results found.  Scheduled Meds: . feeding supplement (ENSURE ENLIVE)  237 mL Oral TID BM  . multivitamin with minerals  1 tablet Oral Daily  . nystatin   Topical q morning - 10a  . pantoprazole  40 mg Oral BID  . phosphorus  500 mg Oral TID WC & HS  . sucralfate  1 g Oral TID WC & HS  . thiamine  100 mg Oral Daily   Continuous Infusions: PRN Meds: acetaminophen **OR** acetaminophen, ipratropium-albuterol, ondansetron **OR** ondansetron (ZOFRAN) IV, traMADol  Time spent: 35 minutes  Author: Lynden Oxford, MD [facility] Pager: [phone] 01/17/2017 6:29 PM  If 7PM-7AM, please contact night-coverage at [email or web address], password [facility]

## 2017-01-17 NOTE — Care Management Important Message (Signed)
Important Message  Patient Details  Name: Laurie HivesJoyce Hester MRN: [id number] Date of Birth: [birth year]   Medicare Important Message Given:  Yes    Marshon Bangs Stefan ChurchBratton 01/17/2017, 12:06 PM

## 2017-01-18 LAB — CBC
HEMATOCRIT: 29.9 % — AB (ref 36.0–46.0)
HEMOGLOBIN: 8 g/dL — AB (ref 12.0–15.0)
MCH: 22.9 pg — AB (ref 26.0–34.0)
MCHC: 26.8 g/dL — AB (ref 30.0–36.0)
MCV: 85.4 fL (ref 78.0–100.0)
Platelets: 148 10*3/uL — ABNORMAL LOW (ref 150–400)
RBC: 3.5 MIL/uL — ABNORMAL LOW (ref 3.87–5.11)
RDW: 18.3 % — ABNORMAL HIGH (ref 11.5–15.5)
WBC: 6.6 10*3/uL (ref 4.0–10.5)

## 2017-01-18 LAB — RENAL FUNCTION PANEL
ANION GAP: 8 (ref 5–15)
Albumin: 2.8 g/dL — ABNORMAL LOW (ref 3.5–5.0)
BUN: 10 mg/dL (ref 6–20)
CALCIUM: 8.7 mg/dL — AB (ref 8.9–10.3)
CO2: 35 mmol/L — AB (ref 22–32)
Chloride: 102 mmol/L (ref 101–111)
Creatinine, Ser: 0.38 mg/dL — ABNORMAL LOW (ref 0.44–1.00)
GFR calc Af Amer: >60 mL/min (ref >60)
GFR calc non Af Amer: >60 mL/min (ref >60)
GLUCOSE: 110 mg/dL — AB (ref 65–99)
Phosphorus: 2.1 mg/dL — ABNORMAL LOW (ref 2.5–4.6)
Potassium: 2.9 mmol/L — ABNORMAL LOW (ref 3.5–5.1)
SODIUM: 145 mmol/L (ref 135–145)

## 2017-01-18 MED ORDER — ONDANSETRON HCL 4 MG/2ML IJ SOLN: 4.0000 mg | Freq: Four times a day (QID) | INTRAMUSCULAR | Status: DC | PRN

## 2017-01-18 MED ORDER — AMOXICILLIN 500 MG PO CAPS
1000.0000 mg | ORAL_CAPSULE | Freq: Two times a day (BID) | ORAL | Status: DC
  Administered 2017-01-18 – 2017-01-19 (×3): 1000 mg via ORAL
  Filled 2017-01-18 (×5): qty 2

## 2017-01-18 MED ORDER — HALOPERIDOL 0.5 MG PO TABS
0.5000 mg | ORAL_TABLET | ORAL | Status: DC | PRN
  Filled 2017-01-18: qty 1

## 2017-01-18 MED ORDER — MORPHINE SULFATE (CONCENTRATE) 10 MG/0.5ML PO SOLN: 5.0000 mg | ORAL | Status: DC | PRN

## 2017-01-18 MED ORDER — ONDANSETRON 4 MG PO TBDP: 4.0000 mg | ORAL_TABLET | Freq: Four times a day (QID) | ORAL | Status: DC | PRN

## 2017-01-18 MED ORDER — ACETAMINOPHEN 325 MG PO TABS: 650.0000 mg | ORAL_TABLET | Freq: Four times a day (QID) | ORAL | Status: DC | PRN

## 2017-01-18 MED ORDER — ALUM & MAG HYDROXIDE-SIMETH 200-200-20 MG/5ML PO SUSP: 30.0000 mL | Freq: Four times a day (QID) | ORAL | Status: DC | PRN

## 2017-01-18 MED ORDER — HALOPERIDOL LACTATE 2 MG/ML PO CONC
0.5000 mg | ORAL | Status: DC | PRN
  Filled 2017-01-18: qty 0.3

## 2017-01-18 MED ORDER — K PHOS MONO-SOD PHOS DI & MONO 155-852-130 MG PO TABS
500.0000 mg | ORAL_TABLET | Freq: Three times a day (TID) | ORAL | Status: AC
  Administered 2017-01-18: 500 mg via ORAL
  Filled 2017-01-18 (×2): qty 2

## 2017-01-18 MED ORDER — ALBUTEROL SULFATE (2.5 MG/3ML) 0.083% IN NEBU: 2.5000 mg | INHALATION_SOLUTION | RESPIRATORY_TRACT | Status: DC | PRN

## 2017-01-18 MED ORDER — GLYCOPYRROLATE 0.2 MG/ML IJ SOLN
0.2000 mg | INTRAMUSCULAR | Status: DC | PRN
  Filled 2017-01-18: qty 1

## 2017-01-18 MED ORDER — CLARITHROMYCIN 250 MG PO TABS
250.0000 mg | ORAL_TABLET | Freq: Two times a day (BID) | ORAL | Status: DC
  Administered 2017-01-18 – 2017-01-19 (×3): 250 mg via ORAL
  Filled 2017-01-18 (×5): qty 1

## 2017-01-18 MED ORDER — ACETAMINOPHEN 650 MG RE SUPP: 650.0000 mg | Freq: Four times a day (QID) | RECTAL | Status: DC | PRN

## 2017-01-18 MED ORDER — POTASSIUM CHLORIDE 20 MEQ PO PACK
40.0000 meq | PACK | ORAL | Status: AC
  Administered 2017-01-18 (×2): 40 meq via ORAL
  Filled 2017-01-18 (×2): qty 2

## 2017-01-18 MED ORDER — MAGIC MOUTHWASH: 15.0000 mL | Freq: Four times a day (QID) | ORAL | Status: DC | PRN

## 2017-01-18 MED ORDER — MORPHINE SULFATE (CONCENTRATE) 10 MG/0.5ML PO SOLN
5.0000 mg | ORAL | Status: DC | PRN
  Administered 2017-01-18 – 2017-01-19 (×3): 5 mg via ORAL
  Filled 2017-01-18 (×3): qty 0.5

## 2017-01-18 MED ORDER — BISACODYL 10 MG RE SUPP: 10.0000 mg | Freq: Every day | RECTAL | Status: DC | PRN

## 2017-01-18 MED ORDER — GLYCOPYRROLATE 1 MG PO TABS
1.0000 mg | ORAL_TABLET | ORAL | Status: DC | PRN
  Filled 2017-01-18: qty 1

## 2017-01-18 MED ORDER — POTASSIUM CHLORIDE 20 MEQ PO PACK
20.0000 meq | PACK | Freq: Every day | ORAL | Status: DC
  Administered 2017-01-19: 20 meq via ORAL
  Filled 2017-01-18 (×2): qty 1

## 2017-01-18 MED ORDER — HALOPERIDOL LACTATE 5 MG/ML IJ SOLN: 0.5000 mg | INTRAMUSCULAR | Status: DC | PRN

## 2017-01-18 NOTE — Progress Notes (Signed)
1520--[facility] Hospital Liaison RN Visit  Notified by Dr. Allena KatzPatel and Dixie DialsSarah Jefferies, [facility], of patient and family request [facility] after discharge. Chart and patient information are being [facility]. Hospice eligibility pending at this time.  Spoke with Delaney Meigsamara, daughter and Avon GullyHCPOA, Sons Kathlene NovemberMike and Luisa Hartatrick and daughter-in law Deno EtienneLailanie outside of pt room per family request to initiate education related to hospice philosophy, services and team approach to care. Family verbalized understanding of information given. Per discussion plan is for discharge home by private vehicle on 6/918 or 01/19/17.  Please send signed and completed DNR form home with patient/family. Patient will need prescriptions for discharge comfort medications.  DME needs discussed. Family requests the following DME for delivery to the home: Hospital bed with 1/2 rails, Over the bed table and O2. [facility] notified and will arrange delivery to the home. The home address has been verified and is correct in the chart. Delaney Meigsamara, daughter, is the family member to contact to arrange time of delivery at [phone].  [facility] Referral Center is aware of the above. Completed discharge summary will need to be [facility]ed [facility] at [phone], when final.  Please notify [facility] when patient is ready to leave the unit at discharge at [phone]. [facility] information and contact numbers have been given to each of the family members present at meeting. Above information shared with Dixie DialsSarah Jefferies, RNCM.  Please call with any hospice related questions or concerns.  Thank you for the referral.  Haynes Bastracy Ennis, RN [facility] Hospital Liaison [phone]  Hospital Liaisons are now on AMION.

## 2017-01-18 NOTE — Progress Notes (Addendum)
[facility] Progress Note  Patient: Laurie Sparks VHQ:[id number]   PCP: Halina Maidens., MD (Inactive) DOB: 06-Jan-1941   DOA: 01/14/2017   DOS: 01/18/2017   Date of Service: the patient was seen and examined on 01/18/2017  Subjective: Patient appears more lethargic this morning. By mouth intake remains poor. Denied any complaint or pain anywhere but went back to sleep immediately. I had a prolonged family meeting with her son, and daughter.  Brief hospital course: Pt. with PMH of severe COPD, chronic respiratory failure on 3 L of oxygen, chronic diastolic CHF, GERD, large hiatal hernia; admitted on 01/14/2017, presented with complaint of poor by mouth intake, was found to have acute kidney injury as well as symptomatic anemia and erosive esophagitis. Underwent EGD, biopsy was positive for H. pylori. Hemoglobin remained stable. By mouth intake remained poor. Found to have large hiatal hernia. Surgery and GI recommended patient not a good candidate for any intervention or feeding tube placement. Discussed with family on 01/18/2017, patient has explicitly mentioned in the past as well as during this admission when she was oriented to her daughter that she is tired and just wants to rest. Family chose transition care to comfort care with plan for home with hospice. Currently further plan is to arrange for home with hospice.  Assessment and Plan: 1. Failure to thrive. Protein calorie malnutrition, severe. 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 Large hiatal hernia Patient primarily presented with poor by mouth intake as well as lethargy. Appears persistently more lethargic. By mouth intake still limited although the patient is nothing by mouth for EGD. Dietary consulted, speech therapy consulted recommended to advance the diet as tolerated. While her son who is at bedside mentions that this has been fairly recent that she has not been eating anything for last 2 weeks it appears by the degree of malnutrition that this  is a long-standing issue likely contributed from her chronic nausea and vomiting as well as large hiatal hernia and COPD. Palliative care consulted for further discussion on goals of care. I had a prolonged family meeting today. Given information regarding any interventional option to provide nutrition to patient and with her being a poor surgical candidate family decided up for comfort care with hospice. Patient exquisitely told her daughter 2 days ago she is tired and she wants everything to stop. Case manager consulted as well as hospice informed. Also informed palliative care medicine team.  2. Acute kidney injury. Based on renal function and serum creatinine running around 0.5. Presented with serum creatinine of 1.56 and BUN of 41. Currently  better with IV hydration although there is high chance of this reoccurring given patient's poor by mouth intake.  3. Acute encephalopathy. Likely chronic dementia. Per son the patient is acutely confused and at her baseline is fairly active. I suspect the patient may have undiagnosed dementia which the family has not identified at home and currently appears to have delirium from acute kidney injury as well as poor by mouth intake. Continue multivitamins Normal TSH and free T4. Normal ammonia.  4. COPD. Chronic respiratory failure. Chronic Bilateral pleural effusion. Patient has severe COPD at her baseline no evidence of acute exacerbation. Continue when necessary DuoNeb's and had scheduled dulera. Also has bilateral pleural effusion unchanged from an x-ray done a year ago. CT chest abdomen and pelvis shows no malignancy but shows that her small bowel is in her chest due to hernia.  5. Suspected GI bleed. Suspected acute blood loss anemia, more likely chronic anemia from  nutritional deficiency. H. pylori infection. GERD with erosive esophagitis. Patient presents with fatigue and lethargy. On admission her hemoglobin was 9.9, which is a  significant drop from her baseline. No evidence of active bleeding reported per family. No evidence of active bleeding here in the hospital as well. GI was consulted, patient was started on IV Protonix drip. on Carafate, by mouth PPI twice a day EGD shows erosive esophagitis. As per GI recommendation patient was started on H. pylori treatment.  6. Hiatal hernia. Appreciate surgical consultation requested by GI. Patient is not a candidate for any surgical intervention given her severe COPD as well as protein calorie malnutrition and undiagnosed dementia. Performing open jejunostomy is also not a safe option for this patient.  Diet: comfort feeds DVT Prophylaxis: on comfort care  Advance goals of care discussion: DNR/DNI  Family Communication: family was present at bedside, at the time of interview. Had a family meeting between 1219 and 1:10 PM.  Opportunity was given to ask question and all questions were answered satisfactorily.   Disposition:  Discharge to home with hospice.  Consultants: Palliative care, GI, surgery Procedures: EGD  Antibiotics: Anti-infectives    Start     Dose/Rate Route Frequency Ordered Stop   01/18/17 1000  clarithromycin (BIAXIN) tablet 250 mg     250 mg Oral Every 12 hours 01/18/17 0800     01/18/17 1000  amoxicillin (AMOXIL) capsule 1,000 mg     1,000 mg Oral Every 12 hours 01/18/17 0800         Objective: Physical Exam: Vitals:   01/17/17 1730 01/17/17 2135 01/18/17 0405 01/18/17 0816  BP: 134/62 133/71 123/61 138/69  Pulse: 88 89 93 94  Resp: 16 17 16 16   Temp: 97.4 F (36.3 C) 97.8 F (36.6 C) 97.8 F (36.6 C) 98.4 F (36.9 C)  TempSrc: Oral Oral Oral Oral  SpO2: 100% 100% 96% 94%  Weight:      Height:        Intake/Output Summary (Last 24 hours) at 01/18/17 1328 Last data filed at 01/18/17 1000  Gross per 24 hour  Intake              680 ml  Output                0 ml  Net              680 ml   Filed Weights   01/14/17 1700  01/15/17 2129 01/16/17 2123  Weight: 34 kg (74 lb 14.4 oz) 34.9 kg (77 lb) 35.5 kg (78 lb 4.2 oz)   General: Alert, Awake. Appear in moderate distress, affect flat Eyes: PERRL, Conjunctiva normal ENT: Oral Mucosa clear moist. Cardiovascular: S1 and S2 Present, no Murmur,  Respiratory: increased respiratory effort, Bilateral Air entry equal and Decreased, positive use of accessory muscle, bilateral Crackles, no wheezes Abdomen: Bowel Sound present, Soft and no tenderness, Skin: no redness, no Rash, no induration Extremities: no Pedal edema, no calf tenderness Neurologic: Presently lethargic today.  Data Reviewed: CBC:  Recent Labs Lab 01/13/17 2238 01/14/17 0538 01/14/17 1554 01/15/17 [id number] 01/16/17 0501 01/17/17 0416 01/18/17 0652  WBC 10.8* 11.3*  --  10.8* 10.2 7.0 6.6  NEUTROABS 9.1*  --   --   --  7.9*  --   --   HGB 9.9* 9.7* 8.7* 9.0* 9.0* 8.1* 8.0*  HCT 36.5 35.6* 32.2* 34.0* 34.7* 30.6* 29.9*  MCV 82.8 82.8  --  85.6 87.8 84.8 85.4  PLT  188 155  --  166 154 139* 148*   Basic Metabolic Panel:  Recent Labs Lab 01/14/17 0538 01/15/17 0635 01/16/17 0501 01/17/17 0416 01/18/17 0652  NA 139 141 139 141 145  K 4.3 3.7 3.6 3.0* 2.9*  CL 98* 105 101 101 102  CO2 25 26 30  34* 35*  GLUCOSE 107* 107* 151* 120* 110*  BUN 42* 35* 28* 18 10  CREATININE 1.44* 0.99 0.70 0.53 0.38*  CALCIUM 8.7* 8.3* 8.5* 9.1 8.7*  MG  --   --  1.8  --   --   PHOS  --   --  2.2* <1.0* 2.1*    Liver Function Tests:  Recent Labs Lab 01/13/17 2238 01/15/17 0635 01/16/17 0501 01/17/17 0416 01/18/17 0652  AST 35 32  --   --   --   ALT 16 15  --   --   --   ALKPHOS 37* 38  --   --   --   BILITOT 1.4* 0.9  --   --   --   PROT 4.9* 5.1*  --   --   --   ALBUMIN 3.0* 3.0* 3.1* 3.1* 2.8*   No results for input(s): LIPASE, AMYLASE in the last 168 hours.  Recent Labs Lab 01/15/17 1126  AMMONIA 26   Coagulation Profile:  Recent Labs Lab 01/14/17 0117  INR 1.26   Cardiac  Enzymes: No results for input(s): CKTOTAL, CKMB, CKMBINDEX, TROPONINI in the last 168 hours. BNP (last 3 results) No results for input(s): PROBNP in the last 8760 hours. CBG: No results for input(s): GLUCAP in the last 168 hours. Studies: No results found.  Scheduled Meds: . amoxicillin  1,000 mg Oral Q12H  . clarithromycin  250 mg Oral Q12H  . feeding supplement (ENSURE ENLIVE)  237 mL Oral TID BM  . multivitamin with minerals  1 tablet Oral Daily  . nystatin   Topical q morning - 10a  . pantoprazole  40 mg Oral BID  . phosphorus  500 mg Oral TID WC & HS  . sucralfate  1 g Oral TID WC & HS  . thiamine  100 mg Oral Daily   Continuous Infusions: PRN Meds: acetaminophen **OR** acetaminophen, albuterol, alum & mag hydroxide-simeth, bisacodyl, glycopyrrolate **OR** glycopyrrolate **OR** glycopyrrolate, haloperidol **OR** haloperidol **OR** haloperidol lactate, ipratropium-albuterol, magic mouthwash, morphine CONCENTRATE **OR** morphine CONCENTRATE, ondansetron **OR** ondansetron (ZOFRAN) IV, ondansetron **OR** ondansetron (ZOFRAN) IV, traMADol  Time spent: 70 minutes, evaluated the pt, had family meeting between 1219 and 1:10 PM. Rest of the time spent with coordinating with staff, placing orders and documentation.   Author: Lynden Oxford, MD [facility] Pager: [phone] 01/18/2017 1:28 PM  If 7PM-7AM, please contact night-coverage at [email or web address], password [facility]

## 2017-01-18 NOTE — Progress Notes (Signed)
CM received call from MD Allena Katz(Patel) stating pt and family has decided on [facility] to render hospice services at home. (See previous CM note).  [facility].  No other CM needs were communicated.

## 2017-01-19 MED ORDER — K PHOS MONO-SOD PHOS DI & MONO 155-852-130 MG PO TABS
500.0000 mg | ORAL_TABLET | Freq: Every day | ORAL | Status: DC
  Administered 2017-01-19: 500 mg via ORAL
  Filled 2017-01-19 (×2): qty 2

## 2017-01-19 MED ORDER — MORPHINE SULFATE (CONCENTRATE) 10 MG/0.5ML PO SOLN
5.0000 mg | ORAL | Status: DC | PRN
  Administered 2017-01-20: 5 mg via ORAL
  Filled 2017-01-19: qty 0.5

## 2017-01-19 MED ORDER — MORPHINE SULFATE (PF) 2 MG/ML IV SOLN: 2.0000 mg | INTRAVENOUS | Status: DC | PRN

## 2017-01-19 MED ORDER — MORPHINE SULFATE (CONCENTRATE) 10 MG/0.5ML PO SOLN
5.0000 mg | ORAL | Status: DC | PRN
  Administered 2017-01-20: 5 mg via SUBLINGUAL
  Administered 2017-01-20: 10 mg via SUBLINGUAL
  Filled 2017-01-19 (×2): qty 0.5

## 2017-01-19 NOTE — Progress Notes (Signed)
1300---[facility] Liaison RN Visit--  Received phone call from [facility] Triage that patient's daughter, Laurie Sparks, had called and wished to see hospital liaison.   Visited with patient and daughter Laurie Sparks in room. Daughter wanted to notify that she would prefer patient to go home by ambulance upon discharge. Notified Lurena JoinerRebecca of this information. Though equipment [facility], Laurie Sparks tells me she was informed it could not be delivered to the home until 01/20/17 as [facility].   Will continue to follow and anticipate any discharge needs.   Please feel free to call with any hospice related questions or concerns.  Thank you, Haynes Bastracy Ennis, RN  [facility] Hospital Liaison [phone]

## 2017-01-19 NOTE — Progress Notes (Addendum)
[facility] Progress Note  Patient: Laurie Sparks ZOX:[id number]   PCP: Halina Maidens., MD (Inactive) DOB: 1941-04-10   DOA: 01/14/2017   DOS: 01/19/2017   Date of Service: the patient was seen and examined on 01/19/2017  Subjective: Patient more lethargic as well as visit was short of breath. No acute events overnight. Required 2 doses of morphine so far.  Brief hospital course: Pt. with PMH of severe COPD, chronic respiratory failure on 3 L of oxygen, chronic diastolic CHF, GERD, large hiatal hernia; admitted on 01/14/2017, presented with complaint of poor by mouth intake, was found to have acute kidney injury as well as symptomatic anemia and erosive esophagitis. Underwent EGD, biopsy was positive for H. pylori. Hemoglobin remained stable. By mouth intake remained poor. Found to have large hiatal hernia. Surgery and GI recommended patient not a good candidate for any intervention or feeding tube placement. Discussed with family on 01/18/2017, patient has explicitly mentioned in the past as well as during this admission when she was oriented to her daughter that she is tired and just wants to rest. Family chose transition care to comfort care with plan for home with hospice. Currently further plan is to arrange for home with hospice.  Assessment and Plan: 1. Failure to thrive. Protein calorie malnutrition, severe. 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 Large hiatal hernia Patient primarily presented with poor by mouth intake as well as lethargy. Appears persistently more lethargic. By mouth intake still limited although the patient is nothing by mouth for EGD. Dietary consulted, speech therapy consulted recommended to advance the diet as tolerated. While her son who is at bedside mentions that this has been fairly recent that she has not been eating anything for last 2 weeks it appears by the degree of malnutrition that this is a long-standing issue likely contributed from her chronic nausea and vomiting  as well as large hiatal hernia and COPD. Palliative care consulted for further discussion on goals of care. I had a prolonged family meeting today. Given information regarding any interventional option to provide nutrition to patient and with her being a poor surgical candidate family decided up for comfort care with hospice. Patient exquisitely told her daughter 2 days ago she is tired and she wants everything to stop. Case manager consulted as well as hospice informed. Also informed palliative care medicine team. Awaiting arrangement for hospice supplies at home. I would increase morphine as well as provide IV morphine for dyspnea.  2. Acute kidney injury. Based on renal function and serum creatinine running around 0.5. Presented with serum creatinine of 1.56 and BUN of 41. Currently  better with IV hydration although there is high chance of this reoccurring given patient's poor by mouth intake.  3. Acute encephalopathy. Likely chronic dementia. Per son the patient is acutely confused and at her baseline is fairly active. I suspect the patient may have undiagnosed dementia which the family has not identified at home and currently appears to have delirium from acute kidney injury as well as poor by mouth intake. Continue multivitamins Normal TSH and free T4. Normal ammonia.  4. COPD. Chronic respiratory failure. Chronic Bilateral pleural effusion. Patient has severe COPD at her baseline no evidence of acute exacerbation. Continue when necessary DuoNeb's and had scheduled dulera. Also has bilateral pleural effusion unchanged from an x-ray done a year ago. CT chest abdomen and pelvis shows no malignancy but shows that her small bowel is in her chest due to hernia.  5. Suspected GI bleed. Suspected acute blood  loss anemia, more likely chronic anemia from nutritional deficiency. H. pylori infection. GERD with erosive esophagitis. Patient presents with fatigue and lethargy. On admission  her hemoglobin was 9.9, which is a significant drop from her baseline. No evidence of active bleeding reported per family. No evidence of active bleeding here in the hospital as well. GI was consulted, patient was started on IV Protonix drip. on Carafate, by mouth PPI twice a day EGD shows erosive esophagitis. As per GI recommendation patient was started on H. pylori treatment.  6. Hiatal hernia. Appreciate surgical consultation requested by GI. Patient is not a candidate for any surgical intervention given her severe COPD as well as protein calorie malnutrition and undiagnosed dementia. Performing open jejunostomy is also not a safe option for this patient.  Diet: comfort feeds DVT Prophylaxis: on comfort care  Advance goals of care discussion: DNR/DNI  Family Communication: No family at bedside  Disposition:  Discharge to home with hospice.  Consultants: Palliative care, GI, surgery Procedures: EGD  Antibiotics: Anti-infectives    Start     Dose/Rate Route Frequency Ordered Stop   01/18/17 1000  clarithromycin (BIAXIN) tablet 250 mg     250 mg Oral Every 12 hours 01/18/17 0800     01/18/17 1000  amoxicillin (AMOXIL) capsule 1,000 mg     1,000 mg Oral Every 12 hours 01/18/17 0800         Objective: Physical Exam: Vitals:   01/18/17 0816 01/18/17 2156 01/19/17 1000 01/19/17 1754  BP: 138/69 (!) 127/58 102/88 (!) 108/52  Pulse: 94 (!) 103 (!) 123 (!) 119  Resp: 16 17 16 18   Temp: 98.4 F (36.9 C) 97.5 F (36.4 C) 98.6 F (37 C) (!) 96.7 F (35.9 C)  TempSrc: Oral Axillary Oral Axillary  SpO2: 94% 100% 99% 98%  Weight:  36.1 kg (79 lb 9.4 oz)    Height:  5' (1.524 m)      Intake/Output Summary (Last 24 hours) at 01/19/17 1756 Last data filed at 01/19/17 1755  Gross per 24 hour  Intake              357 ml  Output              200 ml  Net              157 ml   Filed Weights   01/15/17 2129 01/16/17 2123 01/18/17 2156  Weight: 34.9 kg (77 lb) 35.5 kg (78 lb  4.2 oz) 36.1 kg (79 lb 9.4 oz)   General: Alert, Awake. Appear in moderate distress, affect flat Eyes: PERRL, Conjunctiva normal ENT: Oral Mucosa clear moist. Cardiovascular: S1 and S2 Present, no Murmur,  Respiratory: increased respiratory effort, Bilateral Air entry equal and Decreased, positive use of accessory muscle, bilateral Crackles, no wheezes Abdomen: Bowel Sound present, Soft and no tenderness, Skin: no redness, no Rash, no induration Extremities: no Pedal edema, no calf tenderness Neurologic: Presently lethargic.  Data Reviewed: CBC:  Recent Labs Lab 01/13/17 2238 01/14/17 0538 01/14/17 1554 01/15/17 [id number] 01/16/17 0501 01/17/17 0416 01/18/17 0652  WBC 10.8* 11.3*  --  10.8* 10.2 7.0 6.6  NEUTROABS 9.1*  --   --   --  7.9*  --   --   HGB 9.9* 9.7* 8.7* 9.0* 9.0* 8.1* 8.0*  HCT 36.5 35.6* 32.2* 34.0* 34.7* 30.6* 29.9*  MCV 82.8 82.8  --  85.6 87.8 84.8 85.4  PLT 188 155  --  166 154 139* 148*   Basic  Metabolic Panel:  Recent Labs Lab 01/14/17 0538 01/15/17 0635 01/16/17 0501 01/17/17 0416 01/18/17 0652  NA 139 141 139 141 145  K 4.3 3.7 3.6 3.0* 2.9*  CL 98* 105 101 101 102  CO2 25 26 30  34* 35*  GLUCOSE 107* 107* 151* 120* 110*  BUN 42* 35* 28* 18 10  CREATININE 1.44* 0.99 0.70 0.53 0.38*  CALCIUM 8.7* 8.3* 8.5* 9.1 8.7*  MG  --   --  1.8  --   --   PHOS  --   --  2.2* <1.0* 2.1*    Liver Function Tests:  Recent Labs Lab 01/13/17 2238 01/15/17 0635 01/16/17 0501 01/17/17 0416 01/18/17 0652  AST 35 32  --   --   --   ALT 16 15  --   --   --   ALKPHOS 37* 38  --   --   --   BILITOT 1.4* 0.9  --   --   --   PROT 4.9* 5.1*  --   --   --   ALBUMIN 3.0* 3.0* 3.1* 3.1* 2.8*   No results for input(s): LIPASE, AMYLASE in the last 168 hours.  Recent Labs Lab 01/15/17 1126  AMMONIA 26   Coagulation Profile:  Recent Labs Lab 01/14/17 0117  INR 1.26   Cardiac Enzymes: No results for input(s): CKTOTAL, CKMB, CKMBINDEX, TROPONINI in the  last 168 hours. BNP (last 3 results) No results for input(s): PROBNP in the last 8760 hours. CBG: No results for input(s): GLUCAP in the last 168 hours. Studies: No results found.  Scheduled Meds: . amoxicillin  1,000 mg Oral Q12H  . clarithromycin  250 mg Oral Q12H  . feeding supplement (ENSURE ENLIVE)  237 mL Oral TID BM  . multivitamin with minerals  1 tablet Oral Daily  . nystatin   Topical q morning - 10a  . pantoprazole  40 mg Oral BID  . phosphorus  500 mg Oral Daily  . potassium chloride  20 mEq Oral Daily  . sucralfate  1 g Oral TID WC & HS  . thiamine  100 mg Oral Daily   Continuous Infusions: PRN Meds: acetaminophen **OR** acetaminophen, albuterol, alum & mag hydroxide-simeth, bisacodyl, glycopyrrolate **OR** glycopyrrolate **OR** glycopyrrolate, haloperidol **OR** haloperidol **OR** haloperidol lactate, ipratropium-albuterol, magic mouthwash, morphine injection, morphine CONCENTRATE **OR** morphine CONCENTRATE, ondansetron **OR** ondansetron (ZOFRAN) IV, ondansetron **OR** ondansetron (ZOFRAN) IV, traMADol  Time spent: 35 minutes  Author: Lynden Oxford, MD [facility] Pager: [phone] 01/19/2017 5:56 PM  If 7PM-7AM, please contact night-coverage at [email or web address], password [facility]

## 2017-01-20 MED ORDER — CLARITHROMYCIN 250 MG PO TABS: 250.0000 mg | ORAL_TABLET | Freq: Two times a day (BID) | ORAL | 0 refills | Status: AC

## 2017-01-20 MED ORDER — ALBUTEROL SULFATE (5 MG/ML) 0.5% IN NEBU: 2.5000 mg | INHALATION_SOLUTION | RESPIRATORY_TRACT | 0 refills | Status: AC | PRN

## 2017-01-20 MED ORDER — BISACODYL 10 MG RE SUPP: 10.0000 mg | Freq: Every day | RECTAL | 0 refills | Status: AC | PRN

## 2017-01-20 MED ORDER — ATROPINE SULFATE 1 % OP SOLN: 1.0000 [drp] | Freq: Three times a day (TID) | OPHTHALMIC | 0 refills | Status: AC | PRN

## 2017-01-20 MED ORDER — AMOXICILLIN 500 MG PO CAPS: 1000.0000 mg | ORAL_CAPSULE | Freq: Two times a day (BID) | ORAL | 0 refills | Status: AC

## 2017-01-20 MED ORDER — PANTOPRAZOLE SODIUM 40 MG PO TBEC: 40.0000 mg | DELAYED_RELEASE_TABLET | Freq: Two times a day (BID) | ORAL | 0 refills | Status: AC

## 2017-01-20 MED ORDER — MORPHINE SULFATE (CONCENTRATE) 10 MG/0.5ML PO SOLN: 5.0000 mg | ORAL | 0 refills | Status: AC | PRN

## 2017-01-20 MED ORDER — ALUM & MAG HYDROXIDE-SIMETH 200-200-20 MG/5ML PO SUSP: 30.0000 mL | Freq: Four times a day (QID) | ORAL | 0 refills | Status: AC | PRN

## 2017-01-20 MED ORDER — POTASSIUM CHLORIDE 20 MEQ PO PACK: 20.0000 meq | PACK | ORAL | 0 refills | Status: AC

## 2017-01-20 MED ORDER — SUCRALFATE 1 GM/10ML PO SUSP: 1.0000 g | Freq: Three times a day (TID) | ORAL | 0 refills | Status: AC

## 2017-01-20 NOTE — Progress Notes (Signed)
Discharge instructions discussed and reviewed with pt's son at bedside, verbalized understanding. Pt will be discharging to home with hospice. IV dc'ed, site clean and dry. Awaiting for ambulance to transport pt.

## 2017-01-20 NOTE — Progress Notes (Signed)
   01/20/17 1200  Clinical Encounter Type  Visited With Patient and family together;Health care provider  Visit Type Initial;Spiritual support;Social support  Referral From Nurse  Consult/Referral To Nurse  Spiritual Encounters  Spiritual Needs Prayer;Emotional  Stress Factors  Patient Stress Factors None identified  Family Stress Factors Exhausted;Family relationships;Health changes    Chaplain followed up on [place] consult for end of life. Patient's son is at bedside. Provided ministry of presence, prayer and emotional support. Patient is going home on hospice later today. Patient and son are in good spirits and anticipating time with family. Tira Lafferty L. Salomon FickBanks, MDiv

## 2017-01-20 NOTE — Discharge Summary (Addendum)
[facility] Discharge Summary   Patient: Laurie Sparks ZOX:[id number]   PCP: Halina MaidensStallings, Sheila C., MD (Inactive) DOB: Jan 01, 1941   Date of admission: 01/14/2017   Date of discharge:  01/20/2017    Discharge Diagnoses:  Principal Problem:   GI bleed Active Problems:   COPD (chronic obstructive pulmonary disease) (HCC)   CHF (congestive heart failure) (HCC)   Oxygen dependent   AKI (acute kidney injury) (HCC)   Leukocytosis   Chronic respiratory failure with hypoxia (HCC)   Iron deficiency anemia due to chronic blood loss   Erosive esophagitis   Excessive weight loss   Goals of care, counseling/discussion   Advance care planning   Palliative care by specialist   Intertriginous dermatitis associated with moisture   Admitted From: home Disposition:  Home with hospice  Recommendations for Outpatient Follow-up:  1. Please establish care with hospice   Follow-up Information    Halina MaidensStallings, Sheila C., MD. Schedule an appointment as soon as possible for a visit in 1 week(s).   Specialty:  Family Medicine         Diet recommendation: comfort feeds  Activity: The patient is advised to gradually reintroduce usual activities.  Discharge Condition: fair  Code Status: DNR DNI on comfort care  History of present illness: As per the H and P dictated on admission, "Laurie HivesJoyce Sparks is a 76 y.o. female with medical history significant of diastolic CHF, COPD, oxygen dependent on 3 L nasal oxygen, CAD, and GERD; who was brought in by her son's for not eating and drinking. History is mostly obtained from the patient's son so present at bedside. At baseline the patient is noted to be able to transition to a bedside commode, but is mostly sedimentary throughout the day. For the last few weeks, the patient has been having progressively  worsening generalized weakness. In the last week, she has only taking a few sips or bites of any food or drink given to her and pushing the rest back.  Associated  symptoms include increased lethargy, unknown amount of weight loss, and dark loose stools. Family notes a chronic history of vomiting after eating meals that is unchanged. Denies having any significant fever, chills, abdominal pain, hematemesis, or NSAID use. Family also makes note of a history of a hiatal hernia, but state GI had declined to do any endoscopy secondary to the patient's heart and respiratory function.  Patient does take a aspirin daily."  Hospital Course:  Summary of her active problems in the hospital is as following. 1. Failure to thrive. Protein calorie malnutrition, severe. 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 Large hiatal hernia Goals of care discussion Patient primarily presented with poor by mouth intake as well as lethargy. Appears persistently more lethargic. By mouth intake still limited although the patient is nothing by mouth for EGD. Dietary consulted, speech therapy consulted recommended to advance the diet as tolerated. While her son who is at bedside mentions that this has been fairly recent that she has not been eating anything for last 2 weeks it appears by the degree of malnutrition that this is a long-standing issue likely contributed from her chronic nausea and vomiting as well as large hiatal hernia and COPD. Palliative care consulted for further discussion on goals of care. I had a prolonged family meeting. Given information regarding lack of any interventional option to provide nutrition to patient and with her being a poor surgical candidate family decided up for comfort care with hospice. Patient exquisitely told her daughter 2 days ago she  is tired and she wants everything to stop. Case manager consulted as well as hospice informed. Also informed palliative care medicine team.  Home hospice arranged. Medication prescribed. Will at present continue H pylori treatment.   2. Acute kidney injury. Based on renal function and serum creatinine running around 0.5. Presented  with serum creatinine of 1.56 and BUN of 41. Currently better with IV hydration although there is high chance of this reoccurring given patient's poor by mouth intake.  3. Acute encephalopathy. Likely chronic dementia. Per son the patient is acutely confused and at her baseline is fairly active. I suspect the patient may have undiagnosed dementia which the family has not identified at home and currently appears to have delirium from acute kidney injury as well as poor by mouth intake. Continue multivitamins Normal TSH and free T4. Normal ammonia.  4. COPD. Chronic respiratory failure. Chronic Bilateral pleural effusion. Patient has severe COPD at her baseline no evidence of acute exacerbation. Continue when necessary DuoNeb's and had scheduled dulera. Also has bilateral pleural effusion unchanged from an x-ray done a year ago. CT chest abdomen and pelvis shows no malignancy but shows that her small bowel is in her chest due to hernia.  5. Suspected GI bleed. Suspected acute blood loss anemia, more likely chronic anemia from nutritional deficiency. H. pylori infection. GERD with erosive esophagitis. Patient presents with fatigue and lethargy. On admission her hemoglobin was 9.9, which is a significant drop from her baseline. No evidence of active bleeding reported per family. No evidence of active bleeding here in the hospital as well. GI was consulted, patient was started on IV Protonix drip. on Carafate, by mouth PPI twice a day EGD shows erosive esophagitis. As per GI recommendation patient was started on H. pylori treatment.  6.Hiatal hernia. Appreciate surgical consultation requested by GI. Patient is not a candidate for any surgical intervention given her severe COPD as well as protein calorie malnutrition and undiagnosed dementia. Performing open jejunostomy is also not a safe option for this patient.  All other chronic medical condition were stable during the  hospitalization.  Home hospice care was arranged by Child psychotherapist and case Production designer, theatre/television/film. On the day of the discharge the patient's symptoms were controlled , and no other acute medical condition were reported by patient. the patient was felt safe to be discharge at home with hospice.  Procedures and Results:  EGD   Consultations:  Gastroenterology   Palliative care  DISCHARGE MEDICATION: Current Discharge Medication List    START taking these medications   Details  alum & mag hydroxide-simeth (MAALOX/MYLANTA) 200-200-20 MG/5ML suspension Take 30 mLs by mouth every 6 (six) hours as needed for indigestion or heartburn (dyspepsia). Qty: 355 mL, Refills: 0    amoxicillin (AMOXIL) 500 MG capsule Take 2 capsules (1,000 mg total) by mouth every 12 (twelve) hours. Qty: 52 capsule, Refills: 0    atropine 1 % ophthalmic solution Place 1 drop under the tongue 3 (three) times daily as needed (secretions). Qty: 2 mL, Refills: 0    bisacodyl (DULCOLAX) 10 MG suppository Place 1 suppository (10 mg total) rectally daily as needed for moderate constipation. Qty: 12 suppository, Refills: 0    clarithromycin (BIAXIN) 250 MG tablet Take 1 tablet (250 mg total) by mouth every 12 (twelve) hours. Qty: 26 tablet, Refills: 0    Morphine Sulfate (MORPHINE CONCENTRATE) 10 MG/0.5ML SOLN concentrated solution Take 0.25 mLs (5 mg total) by mouth every 4 (four) hours as needed for moderate pain (or dyspnea).  Qty: 15 Syringe, Refills: 0    pantoprazole (PROTONIX) 40 MG tablet Take 1 tablet (40 mg total) by mouth 2 (two) times daily. Qty: 26 tablet, Refills: 0    potassium chloride (KLOR-CON) 20 MEQ packet Take 20 mEq by mouth every other day. Qty: 5 packet, Refills: 0    sucralfate (CARAFATE) 1 GM/10ML suspension Take 10 mLs (1 g total) by mouth 4 (four) times daily -  with meals and at bedtime. Qty: 420 mL, Refills: 0      CONTINUE these medications which have CHANGED   Details  albuterol (PROVENTIL) (5  MG/ML) 0.5% nebulizer solution Take 0.5 mLs (2.5 mg total) by nebulization every 2 (two) hours as needed for wheezing. Qty: 20 vial, Refills: 0      CONTINUE these medications which have NOT CHANGED   Details  ergocalciferol (VITAMIN D2) 50000 units capsule Take 50,000 Units by mouth once a week. Take on Tuesdays    nitroGLYCERIN (NITROSTAT) 0.4 MG SL tablet Place 0.4 mg under the tongue every 5 (five) minutes as needed. For chest pain    PROAIR HFA 108 (90 Base) MCG/ACT inhaler Inhale 1-2 puffs into the lungs every 6 (six) hours as needed for wheezing.  Refills: 1    sertraline (ZOLOFT) 100 MG tablet Take 200 mg by mouth daily.     traZODone (DESYREL) 50 MG tablet Take 75 mg by mouth at bedtime.    feeding supplement (BOOST / RESOURCE BREEZE) LIQD Take 1 Container by mouth 3 (three) times daily between meals.    ipratropium (ATROVENT) 0.02 % nebulizer solution Take 2.5 mLs (0.5 mg total) by nebulization 3 (three) times daily as needed for wheezing. Qty: 75 mL, Refills: 0    Multiple Vitamin (MULTIVITAMIN WITH MINERALS) TABS Take 1 tablet by mouth daily.      STOP taking these medications     aspirin EC 81 MG tablet      carvedilol (COREG) 12.5 MG tablet      furosemide (LASIX) 20 MG tablet      HYDROcodone-acetaminophen (NORCO) 7.5-325 MG per tablet      omeprazole (PRILOSEC) 40 MG capsule        Allergies  Allergen Reactions  . Ambien [Zolpidem Tartrate] Other (See Comments)    Hallucinate   . Ativan [Lorazepam] Other (See Comments)    Hallucinations   . Lactose Intolerance (Gi)   . Other     Base metal and something else   Discharge Instructions    Diet general    Complete by:  As directed    Increase activity slowly    Complete by:  As directed      Discharge Exam: Filed Weights   01/16/17 2123 01/18/17 2156 01/19/17 2234  Weight: 35.5 kg (78 lb 4.2 oz) 36.1 kg (79 lb 9.4 oz) 34.7 kg (76 lb 6.4 oz)   Vitals:   01/19/17 2136 01/20/17 0307  BP: (!)  132/51 130/72  Pulse: (!) 112 (!) 108  Resp: 20 20  Temp: 97.6 F (36.4 C)    General: Appear in mild distress, no Rash; Oral Mucosa moist. Cardiovascular: S1 and S2 Present, no Murmur, no JVD Respiratory: Bilateral Air entry present and Clear to Auscultation, no Crackles, no wheezes Abdomen: Bowel Sound present Extremities: no Pedal edema, Neurology: lethargic  The results of significant diagnostics from this hospitalization (including imaging, microbiology, ancillary and laboratory) are listed below for reference.    Significant Diagnostic Studies: Ct Abdomen Pelvis Wo Contrast  Result Date: 01/15/2017  CLINICAL DATA:  Nausea and vomiting. Bilateral pleural effusion. Abnormal weight loss. EXAM: CT CHEST, ABDOMEN AND PELVIS WITHOUT CONTRAST TECHNIQUE: Multidetector CT imaging of the chest, abdomen and pelvis was performed following the standard protocol without IV contrast. COMPARISON:  Radiographs yesterday. FINDINGS: Exaggerated kyphosis distorts normal anatomy. CT CHEST FINDINGS Cardiovascular: Atherosclerosis and tortuosity of the thoracic aorta. There are coronary artery calcifications. The heart is normal in size, displaced anteriorly. No evidence pericardial effusion. Mediastinum/Nodes: Normal diaphoretic contours not well visualized, there is herniation of abdominal contents within the lower thorax, including small bowel and portions of colon. Limited assessment for adenopathy given the distortion of normal anatomy. There is enteric contrast within the esophagus. The gastroesophageal junction and stomach are not well-defined. Lungs/Pleura: Compressive atelectasis in the lower lungs secondary to herniation of intra-abdominal contents. Near complete atelectasis of the right lower and right middle lobes. Near complete atelectasis of the left lower lobe, with minimal superior segment aerated. There are small bilateral pleural effusions, right greater than left. Upper lungs are clear.  Musculoskeletal: Extensive exaggerated thoracic kyphosis. Diffuse bony under mineralization. Mild compression fracture of T8. No lytic or blastic osseous lesions. CT ABDOMEN PELVIS FINDINGS Hepatobiliary: Postcholecystectomy. No evidence of focal hepatic lesion allowing for lack contrast. Common bile duct is not well-defined. Pancreas: Not well visualized, may be in part intrathoracic. Spleen: Normal in size without focal abnormality. Adrenals/Urinary Tract: 5 mm nonobstructing stone in the left kidney. No hydronephrosis. Adrenal glands are not well visualized. There is a surgical clip at the level of the left renal vein. Urinary bladder is physiologically distended, no wall thickening. Stomach/Bowel: Normal bowel anatomy is distorted. Majority of the small bowel appears intrathoracic. Portions of the transverse and descending colon are intrathoracic. There is evidence of colonic wall thickening in the central abdomen, likely the ascending colon which is displaced centrally. The appendix is not confidently visualized. No bowel obstruction, enteric contrast progresses to the sigmoid. Vascular/Lymphatic: Atherosclerosis of the abdominal aorta. Diminish sensitivity for adenopathy given lack of contrast and paucity of intra-abdominal fat. No gross bulky adenopathy. Reproductive: Reported hysterectomy, uterus is not visualized. Other: Small volume of intra-abdominal and pelvic ascites. There is whole body wall edema. Musculoskeletal: Diffuse bony under mineralization. Compression fracture of L1 with 75% loss of height centrally and retropulsion of posterior cortex. Moderate compression fracture of L5 with approximately 50% vertebral body height loss. Milder compression fractures of L2, L4 and possibly L3. Postsurgical change in the left hip. IMPRESSION: 1. Normal diaphragmatic contours are not well-defined, there is herniation of majority of small bowel and portions of the colon into the lower thorax, likely through a  large hernia at the aortic hiatus. No obstruction. 2. Associated atelectasis at the lung bases with near complete atelectasis/compression of the right middle and both lower lobes secondary to herniated bowel. Small bilateral pleural effusions. 3. Colonic wall thickening in the central abdomen, likely displaced ascending colon, suspicious for colitis. 4. Small amount of abdominopelvic ascites. Whole body wall edema. Findings suggest third-spacing. 5. Diffuse osteoporosis with multiple compression fractures of the thoracolumbar spine, most significant at L1. 6. Technically difficult evaluation due to exaggerated thoracic kyphosis and distortion of normal anatomy. Electronically Signed   By: Rubye Oaks M.D.   On: 01/15/2017 22:20   Ct Chest Wo Contrast  Result Date: 01/15/2017 CLINICAL DATA:  Nausea and vomiting. Bilateral pleural effusion. Abnormal weight loss. EXAM: CT CHEST, ABDOMEN AND PELVIS WITHOUT CONTRAST TECHNIQUE: Multidetector CT imaging of the chest, abdomen and pelvis was performed following  the standard protocol without IV contrast. COMPARISON:  Radiographs yesterday. FINDINGS: Exaggerated kyphosis distorts normal anatomy. CT CHEST FINDINGS Cardiovascular: Atherosclerosis and tortuosity of the thoracic aorta. There are coronary artery calcifications. The heart is normal in size, displaced anteriorly. No evidence pericardial effusion. Mediastinum/Nodes: Normal diaphoretic contours not well visualized, there is herniation of abdominal contents within the lower thorax, including small bowel and portions of colon. Limited assessment for adenopathy given the distortion of normal anatomy. There is enteric contrast within the esophagus. The gastroesophageal junction and stomach are not well-defined. Lungs/Pleura: Compressive atelectasis in the lower lungs secondary to herniation of intra-abdominal contents. Near complete atelectasis of the right lower and right middle lobes. Near complete atelectasis of  the left lower lobe, with minimal superior segment aerated. There are small bilateral pleural effusions, right greater than left. Upper lungs are clear. Musculoskeletal: Extensive exaggerated thoracic kyphosis. Diffuse bony under mineralization. Mild compression fracture of T8. No lytic or blastic osseous lesions. CT ABDOMEN PELVIS FINDINGS Hepatobiliary: Postcholecystectomy. No evidence of focal hepatic lesion allowing for lack contrast. Common bile duct is not well-defined. Pancreas: Not well visualized, may be in part intrathoracic. Spleen: Normal in size without focal abnormality. Adrenals/Urinary Tract: 5 mm nonobstructing stone in the left kidney. No hydronephrosis. Adrenal glands are not well visualized. There is a surgical clip at the level of the left renal vein. Urinary bladder is physiologically distended, no wall thickening. Stomach/Bowel: Normal bowel anatomy is distorted. Majority of the small bowel appears intrathoracic. Portions of the transverse and descending colon are intrathoracic. There is evidence of colonic wall thickening in the central abdomen, likely the ascending colon which is displaced centrally. The appendix is not confidently visualized. No bowel obstruction, enteric contrast progresses to the sigmoid. Vascular/Lymphatic: Atherosclerosis of the abdominal aorta. Diminish sensitivity for adenopathy given lack of contrast and paucity of intra-abdominal fat. No gross bulky adenopathy. Reproductive: Reported hysterectomy, uterus is not visualized. Other: Small volume of intra-abdominal and pelvic ascites. There is whole body wall edema. Musculoskeletal: Diffuse bony under mineralization. Compression fracture of L1 with 75% loss of height centrally and retropulsion of posterior cortex. Moderate compression fracture of L5 with approximately 50% vertebral body height loss. Milder compression fractures of L2, L4 and possibly L3. Postsurgical change in the left hip. IMPRESSION: 1. Normal  diaphragmatic contours are not well-defined, there is herniation of majority of small bowel and portions of the colon into the lower thorax, likely through a large hernia at the aortic hiatus. No obstruction. 2. Associated atelectasis at the lung bases with near complete atelectasis/compression of the right middle and both lower lobes secondary to herniated bowel. Small bilateral pleural effusions. 3. Colonic wall thickening in the central abdomen, likely displaced ascending colon, suspicious for colitis. 4. Small amount of abdominopelvic ascites. Whole body wall edema. Findings suggest third-spacing. 5. Diffuse osteoporosis with multiple compression fractures of the thoracolumbar spine, most significant at L1. 6. Technically difficult evaluation due to exaggerated thoracic kyphosis and distortion of normal anatomy. Electronically Signed   By: Rubye Oaks M.D.   On: 01/15/2017 22:20   Dg Abd Acute W/chest  Result Date: 01/14/2017 CLINICAL DATA:  Abdominal pain, nausea, vomiting and diarrhea for several days. EXAM: DG ABDOMEN ACUTE W/ 1V CHEST COMPARISON:  Chest radiographs 02/28/2016 FINDINGS: Bilateral pleural effusions and bibasilar airspace disease, unchanged from prior exam. Cardiomediastinal contours are obscured. Pulmonary vasculature is stable. Patient is kyphotic. Generalized paucity of bowel gas. Minimal air within the sigmoid colon. No significant colonic stool is visualized. Chain sutures  suspected in the left upper quadrant. Pelvic calcifications likely phleboliths. No evidence of free air. The bones are diffusely under mineralized. IMPRESSION: 1. Unchanged appearance of the chest with bilateral pleural effusions and bibasilar airspace disease. 2. Nonspecific bowel-gas pattern with generalized paucity of bowel gas. No evidence of free air. 3. Diffuse osteopenia/osteoporosis. Electronically Signed   By: Rubye Oaks M.D.   On: 01/14/2017 02:34    Microbiology: No results found for this or  any previous visit (from the past 240 hour(s)).   Labs: CBC:  Recent Labs Lab 01/13/17 2238 01/14/17 0538 01/14/17 1554 01/15/17 1610 01/16/17 0501 01/17/17 0416 01/18/17 0652  WBC 10.8* 11.3*  --  10.8* 10.2 7.0 6.6  NEUTROABS 9.1*  --   --   --  7.9*  --   --   HGB 9.9* 9.7* 8.7* 9.0* 9.0* 8.1* 8.0*  HCT 36.5 35.6* 32.2* 34.0* 34.7* 30.6* 29.9*  MCV 82.8 82.8  --  85.6 87.8 84.8 85.4  PLT 188 155  --  166 154 139* 148*   Basic Metabolic Panel:  Recent Labs Lab 01/14/17 0538 01/15/17 0635 01/16/17 0501 01/17/17 0416 01/18/17 0652  NA 139 141 139 141 145  K 4.3 3.7 3.6 3.0* 2.9*  CL 98* 105 101 101 102  CO2 25 26 30  34* 35*  GLUCOSE 107* 107* 151* 120* 110*  BUN 42* 35* 28* 18 10  CREATININE 1.44* 0.99 0.70 0.53 0.38*  CALCIUM 8.7* 8.3* 8.5* 9.1 8.7*  MG  --   --  1.8  --   --   PHOS  --   --  2.2* <1.0* 2.1*   Liver Function Tests:  Recent Labs Lab 01/13/17 2238 01/15/17 0635 01/16/17 0501 01/17/17 0416 01/18/17 0652  AST 35 32  --   --   --   ALT 16 15  --   --   --   ALKPHOS 37* 38  --   --   --   BILITOT 1.4* 0.9  --   --   --   PROT 4.9* 5.1*  --   --   --   ALBUMIN 3.0* 3.0* 3.1* 3.1* 2.8*   No results for input(s): LIPASE, AMYLASE in the last 168 hours.  Recent Labs Lab 01/15/17 1126  AMMONIA 26   Cardiac Enzymes: No results for input(s): CKTOTAL, CKMB, CKMBINDEX, TROPONINI in the last 168 hours. BNP (last 3 results)  Recent Labs  02/23/16 1050 02/24/16 1027  BNP 1,063.5* 606.2*   CBG: No results for input(s): GLUCAP in the last 168 hours. Time spent: 35 minutes  Signed:  Miachel Nardelli  [facility]  01/20/2017  , 9:21 AM

## 2017-01-20 NOTE — Evaluation (Signed)
Occupational Therapy Evaluation Patient Details Name: Laurie HivesJoyce Volland MRN: [id number] DOB: 10-24-1940 Today's Date: 01/20/2017    History of Present Illness Patient is a 76 yo female admitted 01/14/17 with increased weakness and decreased appetite.  Patient with GIB and anemia.    PMH:  CHF, COPD, on 3L O2 at home, CAD, depression, MI, chronic pain   Clinical Impression   Pt demonstrating decreased occupational performance and participation. Pt performed grooming with optimized positioning in bed. Pt required hand over hand A and Max VCs to participate. Per son, she is more fatigued and drowsy than normal because she just received morphine. Son also reports that they will plan to dc home with hospice today; provided education to son on maximizing pt participate in ADLs at home. Answered family questions in preparation for dc.     Follow Up Recommendations  Supervision/Assistance - 24 hour; Home health OT; Other (comment) (Home with hospice)    Equipment Recommendations  None recommended by OT    Recommendations for Other Services       Precautions / Restrictions Precautions Precautions: Fall Restrictions Weight Bearing Restrictions: No      Mobility Bed Mobility Overal bed mobility: (P) Needs Assistance             General bed mobility comments: (P) Optimized bed positoning for grooming at bed level  Transfers                 General transfer comment: Did not perform at this time on request of son. Pt also very sleepy from medicaition    Balance                                           ADL either performed or assessed with clinical judgement   ADL Overall ADL's : Needs assistance/impaired     Grooming: Total assistance;Bed level;Oral care;Wash/dry face;With caregiver independent assisting;Maximal assistance Grooming Details (indicate cue type and reason): Pt participated in oral care and washing her face with Max-total A. Provided optimized  positioning in bed. Pt required hand over hand and Max VCs. Very drowsy from morphine.                              Functional mobility during ADLs:  (for stand pivot only) General ADL Comments: Provided education to son on how to use hand over hand to increase pt's occupational participation at home. Answered son's questions.      Vision         Perception     Praxis      Pertinent Vitals/Pain Pain Assessment: Faces Faces Pain Scale: Hurts a little bit Pain Location: Generalized Pain Descriptors / Indicators: Aching Pain Intervention(s): Limited activity within patient's tolerance;Monitored during session;Premedicated before session     Hand Dominance     Extremity/Trunk Assessment             Communication     Cognition Arousal/Alertness: Awake/alert Behavior During Therapy: Flat affect Overall Cognitive Status: Impaired/Different from baseline Area of Impairment: Following commands;Awareness;Problem solving                       Following Commands: Follows one step commands consistently;Follows one step commands with increased time   Awareness: Intellectual Problem Solving: Slow processing;Decreased initiation;Difficulty sequencing;Requires verbal cues;Requires tactile cues General Comments: Son  reports decline in cognition over the past few days. Pt very groggy and drowsy throughout session. Roe Coombs reports that she just recieved morphine and is tired. Pt difficulty follow one step commands.   General Comments  Son present and very anxious about pt performing therapy.     Exercises     Shoulder Instructions      Home Living                                          Prior Functioning/Environment                   OT Problem List:        OT Treatment/Interventions:      OT Goals(Current goals can be found in the care plan section) Acute Rehab OT Goals Patient Stated Goal: none stated OT Goal Formulation: With  patient/family Time For Goal Achievement: 01/30/17 Potential to Achieve Goals: Fair ADL Goals Pt Will Perform Eating: with set-up;with supervision;sitting Pt Will Perform Grooming: with set-up;with supervision;sitting (x3 tasks) Pt Will Transfer to Toilet: with min guard assist;stand pivot transfer;bedside commode  OT Frequency: Min 2X/week   Barriers to D/C:            Co-evaluation              AM-PAC PT "6 Clicks" Daily Activity     Outcome Measure Help from another person eating meals?: A Little Help from another person taking care of personal grooming?: A Lot Help from another person toileting, which includes using toliet, bedpan, or urinal?: A Lot Help from another person bathing (including washing, rinsing, drying)?: A Lot Help from another person to put on and taking off regular upper body clothing?: A Lot Help from another person to put on and taking off regular lower body clothing?: Total 6 Click Score: 12   End of Session Equipment Utilized During Treatment: Oxygen Nurse Communication: Mobility status  Activity Tolerance: Patient limited by fatigue Patient left: in bed;with call bell/phone within reach;with family/visitor present  OT Visit Diagnosis: Unsteadiness on feet (R26.81);Other abnormalities of gait and mobility (R26.89);Muscle weakness (generalized) (M62.81);Adult, failure to thrive (R62.7)                Time: 1610-9604 OT Time Calculation (min): 11 min Charges:  OT General Charges $OT Visit: 1 Procedure OT Treatments $Self Care/Home Management : 8-22 mins G-Codes:     Braelynn Benning MSOT, OTR/L Acute Rehab Pager: [phone] Office: [phone]  Theodoro Grist Azyriah Nevins 01/20/2017, 9:08 AM

## 2017-01-20 NOTE — Care Management Note (Signed)
Case Management Note  Patient Details  Name: Laurie HivesJoyce Wentzel MRN: [id number] Date of Birth: Mar 16, 1941  Subjective/Objective:     CM following for progression and d/c planning.                Action/Plan: 01/20/2017 Noted plan for d/c to home with hospice services. THis CM spoke with pt son, Doloris HallJoe Cinnamon, who states that in home care will be provided by [facility] ([facility]). DME being delivered to home today, this includes a hospital bed , oxygen and an overbed table. Family awaiting delivery of these items. Pt will d/c to home via ambulance, form placed on chart.  Will contact [facility] for transport of this pt to her home once discharged.    Expected Discharge Date:  01/20/17               Expected Discharge Plan:  Home w Hospice Care  In-House Referral:  Clinical Social Work  Discharge planning Services  CM Consult  Post Acute Care Choice:  Home Health, Hospice Choice offered to:  Adult Children  DME Arranged:  N/A DME Agency:  NA  HH Arranged:  RN, Nurse's Aide, Social Work [employer] Agency:  [facility]  Status of Service:  Completed, signed off  If discussed at [employer] Meetings, dates discussed:    Additional Comments:  Starlyn SkeansRoyal, Cassell Voor[facility]ies U, RN 01/20/2017, 10:33 AM

## 2017-02-09 DEATH — deceased

## 2019-04-20 IMAGING — CT CT CHEST W/O CM
2 of 5 series · 13 of 46 positions shown, 15 images · non-contrast
Comparison: Radiographs yesterday.

CLINICAL DATA: Nausea and vomiting. Bilateral pleural effusion.
Abnormal weight loss.

EXAM:
CT CHEST, ABDOMEN AND PELVIS WITHOUT CONTRAST
TECHNIQUE: Multidetector CT imaging of the chest, abdomen and pelvis was
performed following the standard protocol without IV contrast.

[Series 6: cap w/o 3.0 mm st cor · coronal · non-contrast · 0.56mm/px · 3 of 94 slices shown]
[im 32/94  soft-tissue]
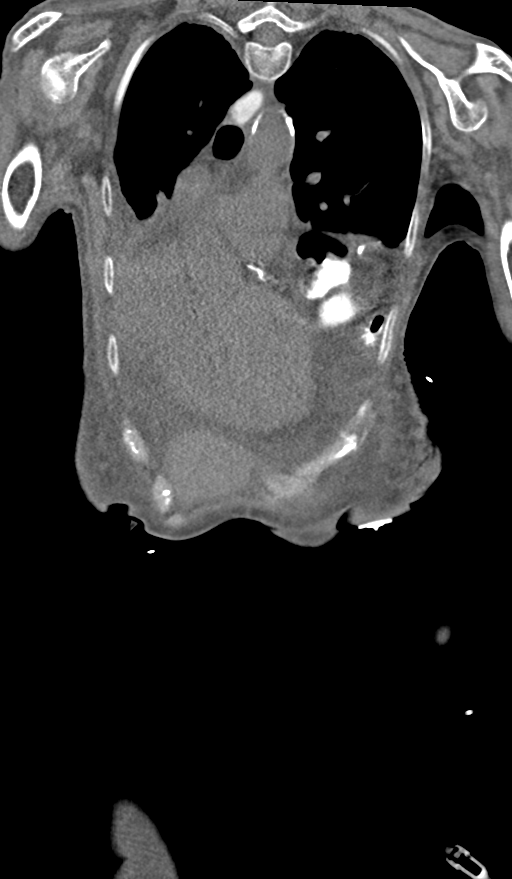
[im 42/94  soft-tissue]
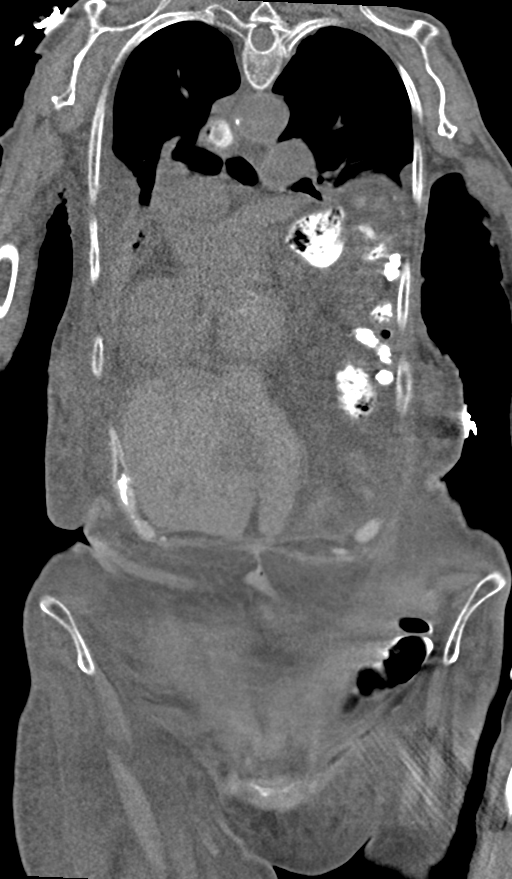
[im 52/94  soft-tissue]
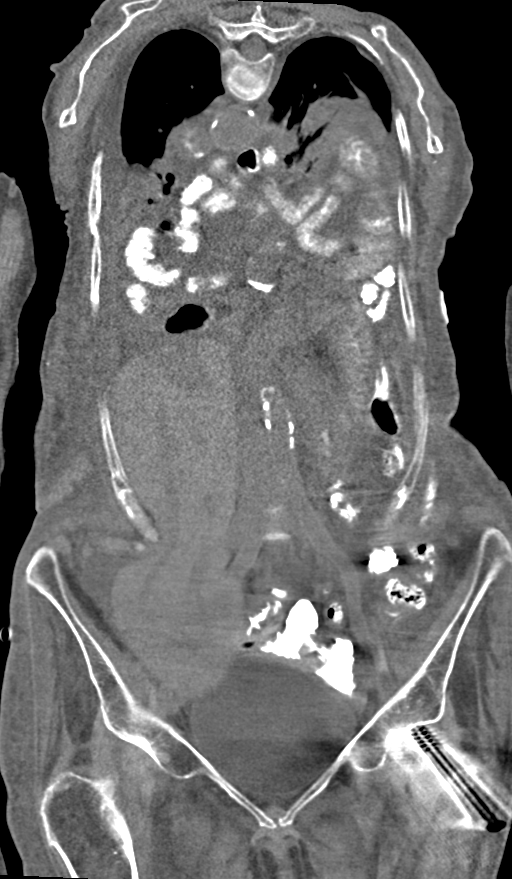

[Series 9: cap w/o 2.0 mm st · axial · non-contrast · 0.50mm/px · z∈[+1043,+1473]mm · 10 of 247 slices shown, 12 images]
[im 16/247  soft-tissue]
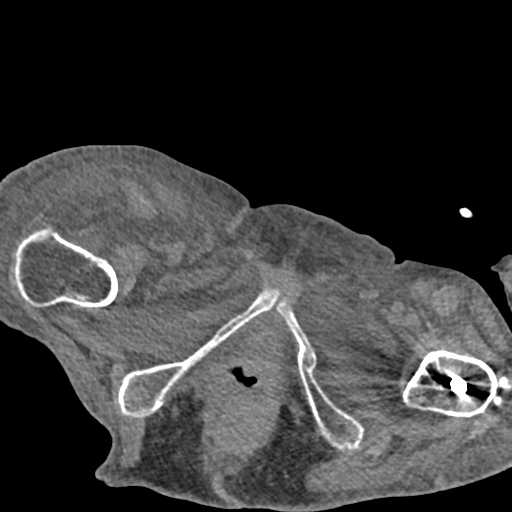
[im 16/247  bone]
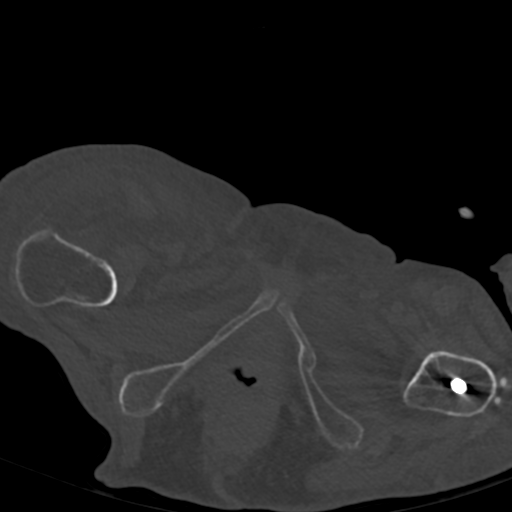
[im 47/247  soft-tissue]
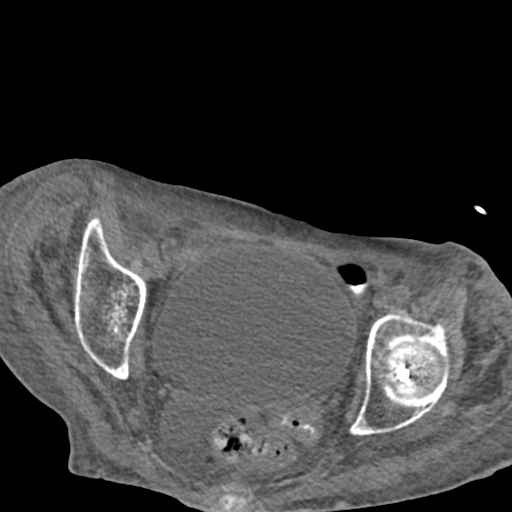
[im 62/247  soft-tissue]
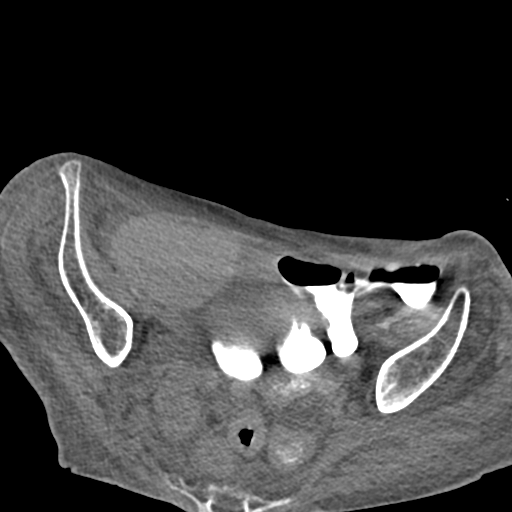
[im 93/247  soft-tissue]
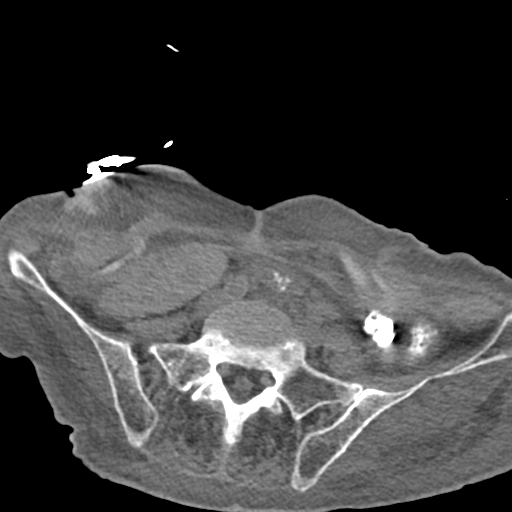
[im 108/247  soft-tissue]
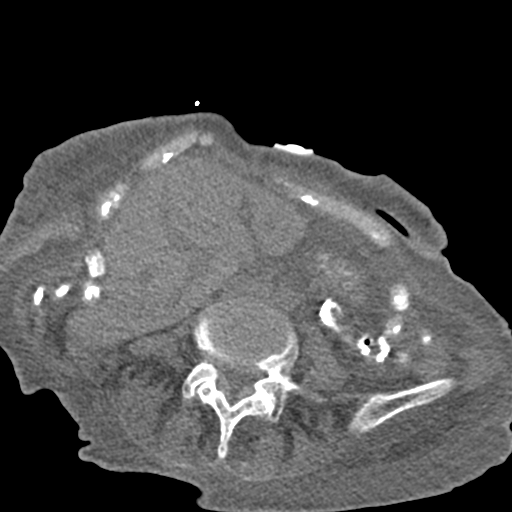
[im 139/247  soft-tissue]
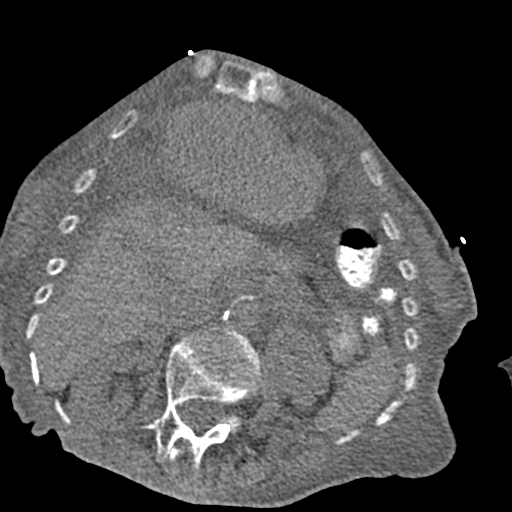
[im 154/247  soft-tissue]
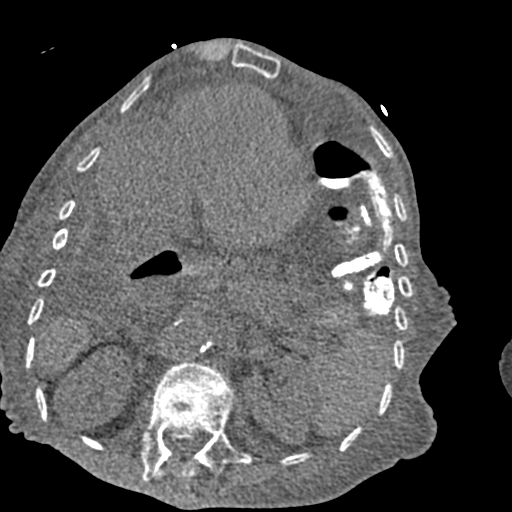
[im 185/247  soft-tissue]
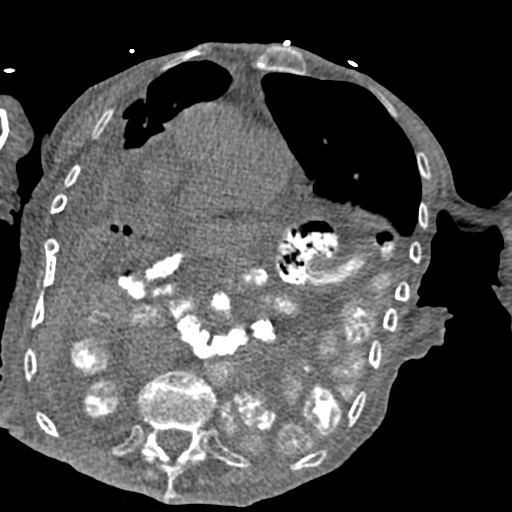
[im 200/247  soft-tissue]
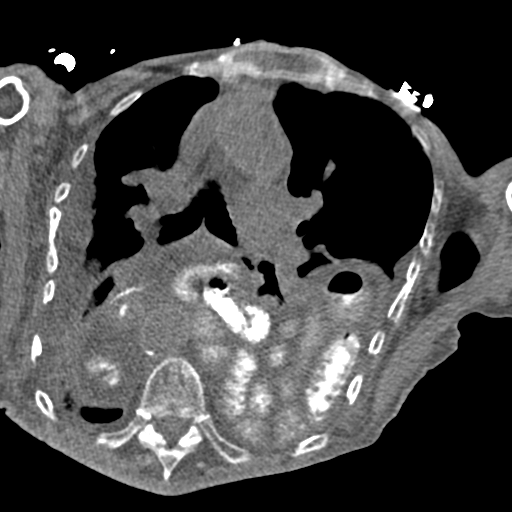
[im 200/247  bone]
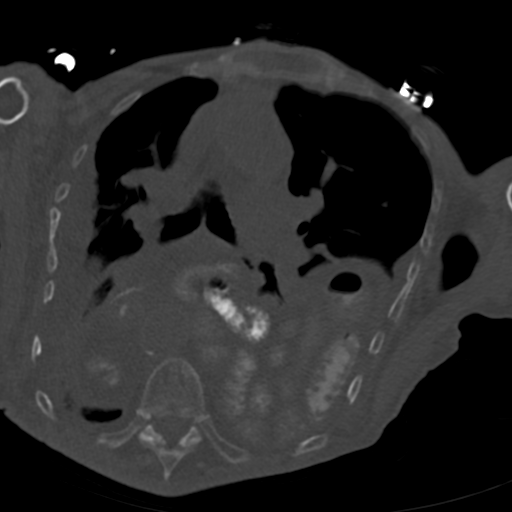
[im 231/247  soft-tissue]
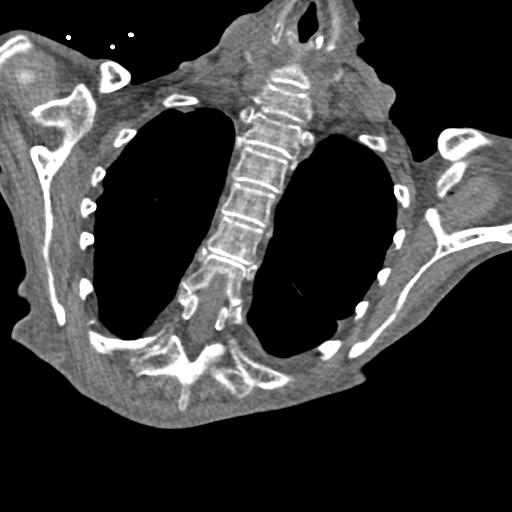

[13 of 46 positions shown; findings below may reference images not displayed]

FINDINGS: Exaggerated kyphosis distorts normal anatomy.

CT CHEST FINDINGS

Cardiovascular: Atherosclerosis and tortuosity of the thoracic
aorta. There are coronary artery calcifications. The heart is normal
in size, displaced anteriorly. No evidence pericardial effusion.

Mediastinum/Nodes: Normal diaphoretic contours not well visualized,
there is herniation of abdominal contents within the lower thorax,
including small bowel and portions of colon. Limited assessment for
adenopathy given the distortion of normal anatomy. There is enteric
contrast within the esophagus. The gastroesophageal junction and
stomach are not well-defined.

Lungs/Pleura: Compressive atelectasis in the lower lungs secondary
to herniation of intra-abdominal contents. Near complete atelectasis
of the right lower and right middle lobes. Near complete atelectasis
of the left lower lobe, with minimal superior segment aerated. There
are small bilateral pleural effusions, right greater than left.
Upper lungs are clear.

Musculoskeletal: Extensive exaggerated thoracic kyphosis. Diffuse
bony under mineralization. Mild compression fracture of T8. No lytic
or blastic osseous lesions.

CT ABDOMEN PELVIS FINDINGS

Hepatobiliary: Postcholecystectomy. No evidence of focal hepatic
lesion allowing for lack contrast. Common bile duct is not
well-defined.

Pancreas: Not well visualized, may be in part intrathoracic.

Spleen: Normal in size without focal abnormality.

Adrenals/Urinary Tract: 5 mm nonobstructing stone in the left
kidney. No hydronephrosis. Adrenal glands are not well visualized.
There is a surgical clip at the level of the left renal vein.
Urinary bladder is physiologically distended, no wall thickening.

Stomach/Bowel: Normal bowel anatomy is distorted. Majority of the
small bowel appears intrathoracic. Portions of the transverse and
descending colon are intrathoracic. There is evidence of colonic
wall thickening in the central abdomen, likely the ascending colon
which is displaced centrally. The appendix is not confidently
visualized. No bowel obstruction, enteric contrast progresses to the
sigmoid.

Vascular/Lymphatic: Atherosclerosis of the abdominal aorta. Diminish
sensitivity for adenopathy given lack of contrast and paucity of
intra-abdominal fat. No gross bulky adenopathy.

Reproductive: Reported hysterectomy, uterus is not visualized.

Other: Small volume of intra-abdominal and pelvic ascites. There is
whole body wall edema.

Musculoskeletal: Diffuse bony under mineralization. Compression
fracture of L1 with 75% loss of height centrally and retropulsion of
posterior cortex. Moderate compression fracture of L5 with
approximately 50% vertebral body height loss. Milder compression
fractures of L2, L4 and possibly L3. Postsurgical change in the left
hip.
IMPRESSION: 1. Normal diaphragmatic contours are not well-defined, there is
herniation of majority of small bowel and portions of the colon into
the lower thorax, likely through a large hernia at the aortic
hiatus. No obstruction.
2. Associated atelectasis at the lung bases with near complete
atelectasis/compression of the right middle and both lower lobes
secondary to herniated bowel. Small bilateral pleural effusions.
3. Colonic wall thickening in the central abdomen, likely displaced
ascending colon, suspicious for colitis.
4. Small amount of abdominopelvic ascites. Whole body wall edema.
Findings suggest third-spacing.
5. Diffuse osteoporosis with multiple compression fractures of the
thoracolumbar spine, most significant at L1.
6. Technically difficult evaluation due to exaggerated thoracic
kyphosis and distortion of normal anatomy.
